# Patient Record
Sex: Male | Born: 1935
Health system: Southern US, Academic
[De-identification: ages and names within clinical notes are randomized; demographics above are authoritative.]

## PROBLEM LIST (undated history)

## (undated) ENCOUNTER — Encounter: Attending: Nurse Practitioner | Primary: Nurse Practitioner

## (undated) ENCOUNTER — Encounter: Attending: Vascular & Interventional Radiology | Primary: Vascular & Interventional Radiology

## (undated) ENCOUNTER — Telehealth: Attending: Nurse Practitioner | Primary: Nurse Practitioner

## (undated) ENCOUNTER — Encounter

## (undated) ENCOUNTER — Telehealth: Attending: Children | Primary: Children

## (undated) ENCOUNTER — Telehealth

## (undated) ENCOUNTER — Encounter
Attending: Student in an Organized Health Care Education/Training Program | Primary: Student in an Organized Health Care Education/Training Program

## (undated) ENCOUNTER — Telehealth
Attending: Student in an Organized Health Care Education/Training Program | Primary: Student in an Organized Health Care Education/Training Program

## (undated) ENCOUNTER — Ambulatory Visit: Payer: MEDICARE | Attending: Pharmacist | Primary: Pharmacist

## (undated) ENCOUNTER — Ambulatory Visit

## (undated) ENCOUNTER — Ambulatory Visit: Payer: Medicare (Managed Care)

## (undated) ENCOUNTER — Ambulatory Visit: Payer: PRIVATE HEALTH INSURANCE

## (undated) ENCOUNTER — Ambulatory Visit: Payer: PRIVATE HEALTH INSURANCE | Attending: Internal Medicine | Primary: Internal Medicine

## (undated) ENCOUNTER — Ambulatory Visit: Payer: MEDICARE

## (undated) ENCOUNTER — Telehealth: Attending: Gastroenterology | Primary: Gastroenterology

## (undated) ENCOUNTER — Ambulatory Visit: Payer: PRIVATE HEALTH INSURANCE | Attending: Registered" | Primary: Registered"

## (undated) ENCOUNTER — Ambulatory Visit: Attending: Nephrology | Primary: Nephrology

## (undated) ENCOUNTER — Ambulatory Visit
Payer: Medicare (Managed Care) | Attending: Vascular & Interventional Radiology | Primary: Vascular & Interventional Radiology

## (undated) ENCOUNTER — Ambulatory Visit: Payer: PRIVATE HEALTH INSURANCE | Attending: Nurse Practitioner | Primary: Nurse Practitioner

## (undated) ENCOUNTER — Ambulatory Visit: Payer: PRIVATE HEALTH INSURANCE | Attending: Nephrology | Primary: Nephrology

## (undated) ENCOUNTER — Ambulatory Visit
Payer: PRIVATE HEALTH INSURANCE | Attending: Student in an Organized Health Care Education/Training Program | Primary: Student in an Organized Health Care Education/Training Program

## (undated) ENCOUNTER — Encounter
Attending: Pharmacist Clinician (PhC)/ Clinical Pharmacy Specialist | Primary: Pharmacist Clinician (PhC)/ Clinical Pharmacy Specialist

## (undated) ENCOUNTER — Encounter: Attending: Gastroenterology | Primary: Gastroenterology

## (undated) ENCOUNTER — Telehealth: Attending: Registered" | Primary: Registered"

## (undated) ENCOUNTER — Telehealth
Payer: MEDICARE | Attending: Student in an Organized Health Care Education/Training Program | Primary: Student in an Organized Health Care Education/Training Program

## (undated) ENCOUNTER — Other Ambulatory Visit

## (undated) ENCOUNTER — Ambulatory Visit
Attending: Pharmacist Clinician (PhC)/ Clinical Pharmacy Specialist | Primary: Pharmacist Clinician (PhC)/ Clinical Pharmacy Specialist

## (undated) DIAGNOSIS — C801 Malignant (primary) neoplasm, unspecified: Secondary | ICD-10-CM

## (undated) DIAGNOSIS — E7800 Pure hypercholesterolemia, unspecified: Secondary | ICD-10-CM

## (undated) DIAGNOSIS — F32A Depression, unspecified: Secondary | ICD-10-CM

## (undated) DIAGNOSIS — K219 Gastro-esophageal reflux disease without esophagitis: Secondary | ICD-10-CM

## (undated) DIAGNOSIS — E119 Type 2 diabetes mellitus without complications: Secondary | ICD-10-CM

## (undated) DIAGNOSIS — M1990 Unspecified osteoarthritis, unspecified site: Secondary | ICD-10-CM

## (undated) DIAGNOSIS — I10 Essential (primary) hypertension: Secondary | ICD-10-CM

## (undated) DIAGNOSIS — N189 Chronic kidney disease, unspecified: Secondary | ICD-10-CM

## (undated) DIAGNOSIS — F329 Major depressive disorder, single episode, unspecified: Secondary | ICD-10-CM

## (undated) HISTORY — PX: COLONOSCOPY: SHX174

## (undated) HISTORY — PX: PROSTATE SURGERY: SHX751

## (undated) MED ORDER — AMLODIPINE 10 MG-VALSARTAN 320 MG TABLET: Freq: Every day | ORAL | 0.00000 days

---
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
## 2013-04-14 ENCOUNTER — Emergency Department: Payer: Self-pay | Admitting: Emergency Medicine

## 2013-10-26 ENCOUNTER — Emergency Department: Payer: Self-pay | Admitting: Emergency Medicine

## 2013-10-26 LAB — CBC WITH DIFFERENTIAL/PLATELET
Eosinophil #: 0.1 10*3/uL (ref 0.0–0.7)
Eosinophil %: 1.7 %
HCT: 39.7 % — ABNORMAL LOW (ref 40.0–52.0)
Lymphocyte #: 1.2 10*3/uL (ref 1.0–3.6)
Lymphocyte %: 18.3 %
MCH: 30.1 pg (ref 26.0–34.0)
Monocyte #: 0.4 x10 3/mm (ref 0.2–1.0)
Monocyte %: 6.9 %
Neutrophil %: 72 %
Platelet: 188 10*3/uL (ref 150–440)
RBC: 4.42 10*6/uL (ref 4.40–5.90)
RDW: 13.4 % (ref 11.5–14.5)

## 2013-10-26 LAB — COMPREHENSIVE METABOLIC PANEL
Albumin: 3.9 g/dL (ref 3.4–5.0)
Alkaline Phosphatase: 83 U/L
Anion Gap: 4 — ABNORMAL LOW (ref 7–16)
BUN: 22 mg/dL — ABNORMAL HIGH (ref 7–18)
Calcium, Total: 9.7 mg/dL (ref 8.5–10.1)
Co2: 28 mmol/L (ref 21–32)
Creatinine: 1.74 mg/dL — ABNORMAL HIGH (ref 0.60–1.30)
EGFR (Non-African Amer.): 37 — ABNORMAL LOW
Glucose: 210 mg/dL — ABNORMAL HIGH (ref 65–99)

## 2013-10-26 LAB — URINALYSIS, COMPLETE
Bilirubin,UR: NEGATIVE
Ketone: NEGATIVE
Leukocyte Esterase: NEGATIVE
Nitrite: NEGATIVE
Ph: 6 (ref 4.5–8.0)
Specific Gravity: 1.019 (ref 1.003–1.030)
Squamous Epithelial: 1
WBC UR: 1 /HPF (ref 0–5)

## 2013-10-26 LAB — LIPASE, BLOOD: Lipase: 234 U/L (ref 73–393)

## 2013-10-26 LAB — TROPONIN I: Troponin-I: <0.02 ng/mL

## 2013-12-15 ENCOUNTER — Ambulatory Visit: Payer: Self-pay | Admitting: Gastroenterology

## 2014-07-01 IMAGING — CR PELVIS - 1-2 VIEW
1 series · 1 of 1 positions shown · non-contrast
Comparison: none

REASON FOR EXAM: pain following trauma
COMMENTS:

PROCEDURE:     DXR - DXR PELVIS AP ONLY  - April 14, 2013  [DATE]
RESULT:     There is no evidence of fracture, dislocation, or malalignment.

[t pelvis ap]
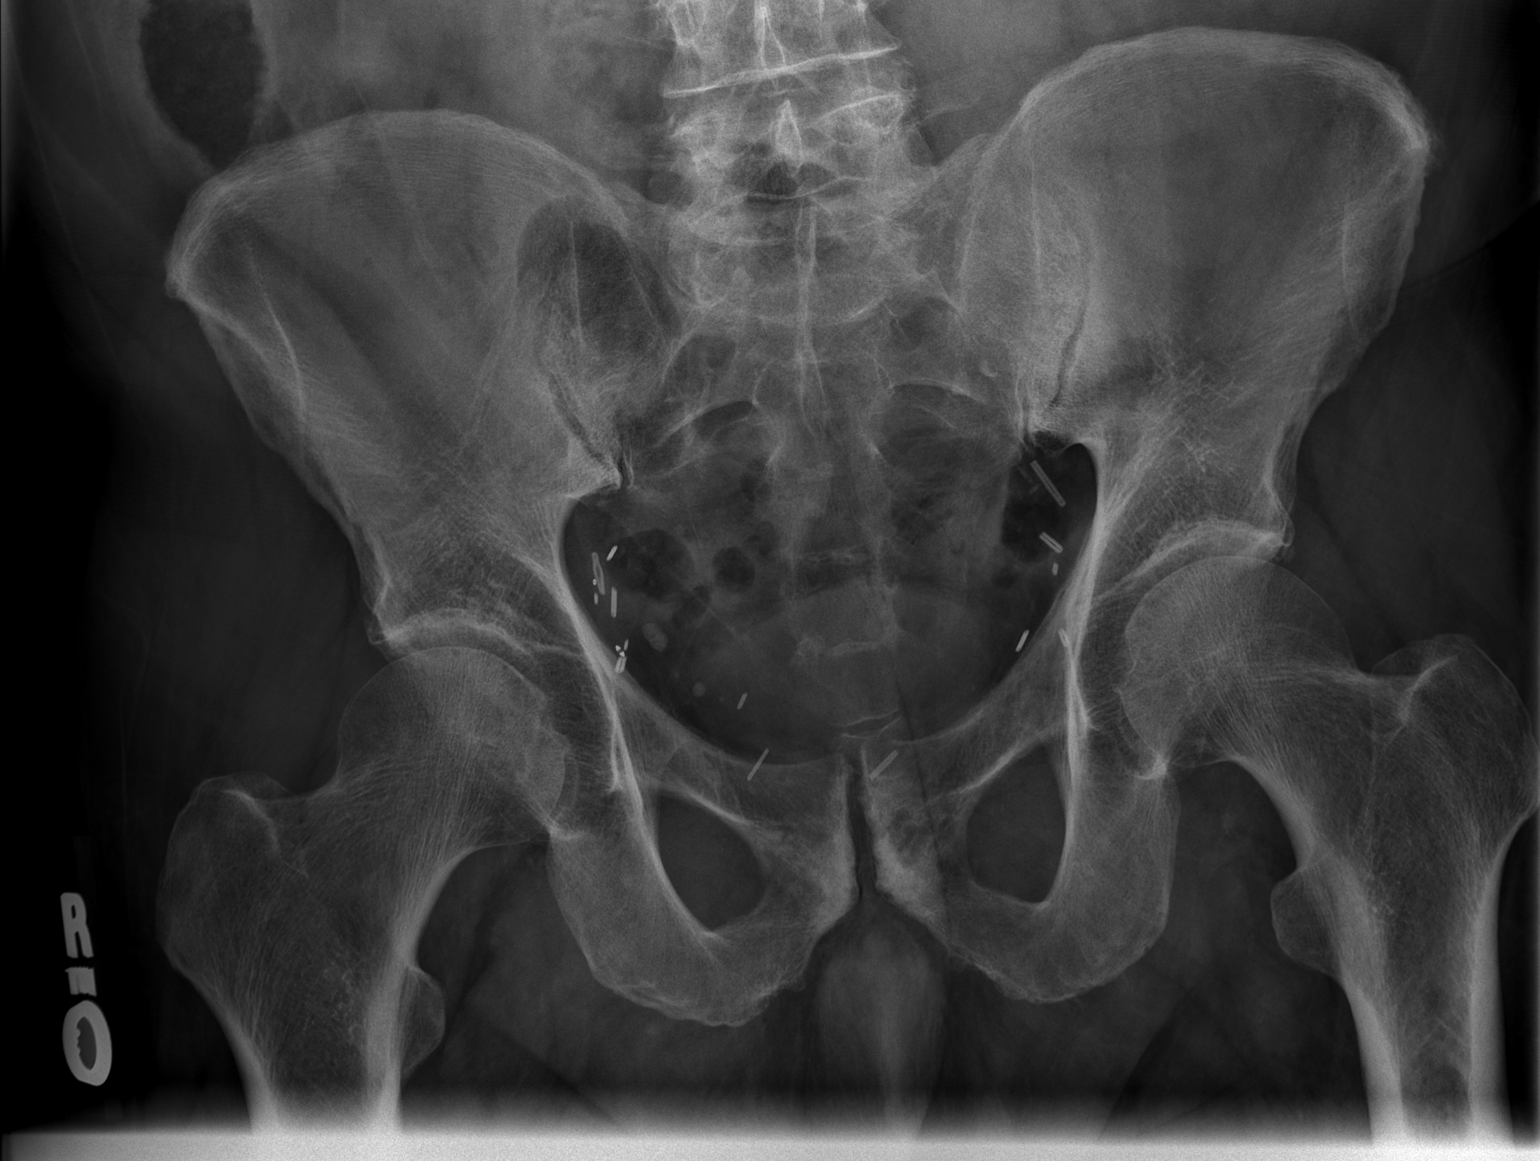

[1 of 1 positions shown; findings below may reference images not displayed]

IMPRESSION: 1. No evidence of acute abnormalities.
2. If there are persistent complaints of pain or persistent clinical
concern, a repeat evaluation in 7-10 days is recommended if clinically
warranted.

## 2014-07-10 ENCOUNTER — Inpatient Hospital Stay: Payer: Self-pay | Admitting: Internal Medicine

## 2014-07-10 LAB — BASIC METABOLIC PANEL
ANION GAP: 10 (ref 7–16)
BUN: 62 mg/dL — ABNORMAL HIGH (ref 7–18)
CALCIUM: 8.8 mg/dL (ref 8.5–10.1)
Chloride: 103 mmol/L (ref 98–107)
Co2: 24 mmol/L (ref 21–32)
Creatinine: 3.17 mg/dL — ABNORMAL HIGH (ref 0.60–1.30)
EGFR (African American): 21 — ABNORMAL LOW
EGFR (Non-African Amer.): 18 — ABNORMAL LOW
Glucose: 157 mg/dL — ABNORMAL HIGH (ref 65–99)
OSMOLALITY: 295 (ref 275–301)
POTASSIUM: 4.1 mmol/L (ref 3.5–5.1)
SODIUM: 137 mmol/L (ref 136–145)

## 2014-07-10 LAB — CBC
HCT: 31.6 % — ABNORMAL LOW (ref 40.0–52.0)
HGB: 10.1 g/dL — ABNORMAL LOW (ref 13.0–18.0)
MCH: 29.2 pg (ref 26.0–34.0)
MCHC: 31.9 g/dL — AB (ref 32.0–36.0)
MCV: 91 fL (ref 80–100)
Platelet: 255 10*3/uL (ref 150–440)
RBC: 3.46 10*6/uL — AB (ref 4.40–5.90)
RDW: 14.4 % (ref 11.5–14.5)
WBC: 14.3 10*3/uL — AB (ref 3.8–10.6)

## 2014-07-10 LAB — TROPONIN I

## 2014-07-11 LAB — CBC WITH DIFFERENTIAL/PLATELET
BASOS PCT: 0.1 %
Basophil #: 0 10*3/uL (ref 0.0–0.1)
EOS ABS: 0 10*3/uL (ref 0.0–0.7)
Eosinophil %: 0 %
HCT: 33.2 % — AB (ref 40.0–52.0)
HGB: 10.8 g/dL — AB (ref 13.0–18.0)
LYMPHS PCT: 4.9 %
Lymphocyte #: 0.5 10*3/uL — ABNORMAL LOW (ref 1.0–3.6)
MCH: 29.9 pg (ref 26.0–34.0)
MCHC: 32.5 g/dL (ref 32.0–36.0)
MCV: 92 fL (ref 80–100)
MONOS PCT: 2.6 %
Monocyte #: 0.3 x10 3/mm (ref 0.2–1.0)
NEUTROS PCT: 92.4 %
Neutrophil #: 10.1 10*3/uL — ABNORMAL HIGH (ref 1.4–6.5)
PLATELETS: 264 10*3/uL (ref 150–440)
RBC: 3.6 10*6/uL — ABNORMAL LOW (ref 4.40–5.90)
RDW: 14.5 % (ref 11.5–14.5)
WBC: 11 10*3/uL — AB (ref 3.8–10.6)

## 2014-07-11 LAB — BASIC METABOLIC PANEL
ANION GAP: 9 (ref 7–16)
BUN: 57 mg/dL — ABNORMAL HIGH (ref 7–18)
Calcium, Total: 8.5 mg/dL (ref 8.5–10.1)
Chloride: 104 mmol/L (ref 98–107)
Co2: 22 mmol/L (ref 21–32)
Creatinine: 2.56 mg/dL — ABNORMAL HIGH (ref 0.60–1.30)
EGFR (Non-African Amer.): 23 — ABNORMAL LOW
GFR CALC AF AMER: 27 — AB
GLUCOSE: 173 mg/dL — AB (ref 65–99)
Osmolality: 290 (ref 275–301)
Potassium: 4.1 mmol/L (ref 3.5–5.1)
SODIUM: 135 mmol/L — AB (ref 136–145)

## 2014-07-12 LAB — BASIC METABOLIC PANEL
Anion Gap: 7 (ref 7–16)
BUN: 54 mg/dL — AB (ref 7–18)
CALCIUM: 8.4 mg/dL — AB (ref 8.5–10.1)
CHLORIDE: 106 mmol/L (ref 98–107)
Co2: 24 mmol/L (ref 21–32)
Creatinine: 2.3 mg/dL — ABNORMAL HIGH (ref 0.60–1.30)
EGFR (Non-African Amer.): 27 — ABNORMAL LOW
GFR CALC AF AMER: 31 — AB
Glucose: 174 mg/dL — ABNORMAL HIGH (ref 65–99)
Osmolality: 293 (ref 275–301)
Potassium: 4.3 mmol/L (ref 3.5–5.1)
Sodium: 137 mmol/L (ref 136–145)

## 2014-07-12 LAB — CLOSTRIDIUM DIFFICILE(ARMC)

## 2014-07-13 LAB — BASIC METABOLIC PANEL
ANION GAP: 7 (ref 7–16)
BUN: 54 mg/dL — ABNORMAL HIGH (ref 7–18)
CALCIUM: 8.9 mg/dL (ref 8.5–10.1)
CHLORIDE: 109 mmol/L — AB (ref 98–107)
CREATININE: 2.02 mg/dL — AB (ref 0.60–1.30)
Co2: 26 mmol/L (ref 21–32)
EGFR (African American): 36 — ABNORMAL LOW
EGFR (Non-African Amer.): 31 — ABNORMAL LOW
GLUCOSE: 59 mg/dL — AB (ref 65–99)
OSMOLALITY: 296 (ref 275–301)
POTASSIUM: 3.6 mmol/L (ref 3.5–5.1)
SODIUM: 142 mmol/L (ref 136–145)

## 2014-07-14 LAB — EXPECTORATED SPUTUM ASSESSMENT W GRAM STAIN, RFLX TO RESP C

## 2014-07-15 LAB — CULTURE, BLOOD (SINGLE)

## 2014-07-16 LAB — STOOL CULTURE

## 2015-03-03 NOTE — Consult Note (Signed)
Brief Consult Note: Diagnosis: Possible  R renal mass. Right renal atrophy. Renal insufficiency.   Patient was seen by consultant.   Consult note dictated.   Recommend further assessment or treatment.   Discussed with Attending MD.   Comments: Patient would like to follow-up with a [facility] urologist, because that is where he receives his medical care. He will need a contrast CT to clearly evaluate the right kidney, however, renal function is not adequate now to allow this.  Electronic Signatures: Royston Cowper (MD)  (Signed 02-Sep-15 19:17)  Authored: Brief Consult Note   Last Updated: 02-Sep-15 19:17 by Royston Cowper (MD)

## 2015-03-03 NOTE — Discharge Summary (Signed)
PATIENT NAME:  [name] [name], [name] [name] MR#:  [id number] DATE OF BIRTH:  Dec 20, 1934  DATE OF ADMISSION:  07/10/2014 DATE OF DISCHARGE:  07/13/2014  PRIMARY CARE PHYSICIAN:  Nonlocal.  CONSULTATIONS: Urology, Otelia Limes. Yves Dill, MD.  DISCHARGE DIAGNOSES:  1.  Acute on chronic renal failure, likely due to dehydration and hypotension.  2.  Acute bronchitis.  3.  Right side kidney mass.  4.  Hypertension.  5.  Diabetes.  6.  Diabetic neuropathy. 7.  Hyperlipidemia.   CONDITION: Stable.   CODE STATUS: Full code.   HOME MEDICATIONS: Please refer to the medication reconciliation list. The patient's chlorthalidone and lisinopril were discontinued due to renal failure and hypotension. Imdur 30 mg p.o. daily was added.   DIET: Low-sodium, low-fat, low-cholesterol, ADA diet.   ACTIVITY: As tolerated.   FOLLOWUP CARE: Follow up with PCP within 1-2 weeks.  Follow up at [facility] for left renal mass. The patient may need a followup BMP with PCP.   REASON FOR ADMISSION: Cough with sputum production and hypotension.   HOSPITAL COURSE: The patient is a 80 year old Caucasian male with a history of hypertension, diabetes, presented to the ED with cough with sputum productive, hypotension.  The patient's chest x-ray suggested acute bronchitis. For detailed history and physical examination, please refer to the admission note dictated by Dr. Verdell Carmine.   LABORATORY DATA ON ADMISSION: BUN 62, creatinine 3.17, sodium 137, potassium 4.1, chloride 103, troponin was less than 0.02, WBC 14.3.   PROBLEM LIST:  1.  Acute on chronic renal failure, possibly due to dehydration and hypotension. The patient's lisinopril and chlorthalidone were discontinued due to renal failure. The patient has been treated with IV fluid support. Renal function is improving. The patient's BUN decreased to 54, creatinine decreased to 2.02 and since the patient has acute renal failure on chronic kidney disease, the patient got an ultrasound of  kidneys which showed right side renal mass. Dr. Yves Dill, urology, suggested the patient may need a CAT scan with contrast; however, as this patient has acute renal failure on chronic kidney disease, the patient cannot get a CAT scan of the abdomen. Dr. Yves Dill suggested followup as an outpatient. The patient wants to follow up with [facility] as an outpatient.  2.  Acute bronchitis. The patient has been treated with Levaquin. Symptoms have much improved, only has a mild cough.  3.  Diabetes. Has been controlled with sliding scale. The patient had some diarrhea for 3 days, however, stool for Clostridium difficile was negative. The patient's diarrhea resolved.   The patient has no complaints. Vital signs are stable. He is clinically stable and he will be discharged to home today. I discussed the patient's discharge plan with the patient, nurse, case manager.   TIME SPENT: About 36 minutes.    ____________________________ Demetrios Loll, MD qc:lt D: 07/13/2014 12:32:27 ET T: 07/13/2014 13:07:54 ET JOB#: [id number]  cc: Demetrios Loll, MD, <Dictator> Demetrios Loll MD ELECTRONICALLY SIGNED 07/13/2014 14:17

## 2015-03-03 NOTE — H&P (Signed)
PATIENT NAME:  [name], [name] MR#:  [id number] DATE OF BIRTH:  May 01, 1935  DATE OF ADMISSION:  07/10/2014   PRIMARY CARE PHYSICIAN: Located at [facility].   CHIEF COMPLAINT: Cough with productive sputum and hypotension.   HISTORY OF PRESENT ILLNESS: This 80 year old male who presents to the hospital with generalized weakness and having a cough now for about a week to 2 weeks.  The patient says this cough has been productive.  Initially, it was green in color, now it is yellow and mucusy in color.  He went to urgent care.  Over there, he was noted to be hypotensive with blood pressures in the 80s.  He was sent over to the ER for further evaluation.  In the Emergency Room, the patient was not noted to be febrile.  He was not hypotensive when he arrived here.  Although on blood work, he was noted to be in acute on chronic renal failure, also noted to have leukocytosis.  The patient's chest x-ray finding were also suggestive of acute bronchitis.  Hospitalist services were contacted for further treatment and evaluation.  The patient denies any recent sick contacts.  He does admit to a cough that keeps him up at night with productive sputum as mentioned, no shortness of breath. No nausea, no vomiting. No abdominal pain. No diarrhea. No other associated symptoms presently.   REVIEW OF SYSTEMS:  CONSTITUTIONAL: No documented fever. Positive fatigue. No weight gain, no weight loss.  EYES: No blurry or double vision.  ENT: No tinnitus. No postnasal drip. No redness of the oropharynx.  RESPIRATORY: Positive cough productive, no dyspnea, no hemoptysis, no COPD.  CARDIOVASCULAR: No chest pain, no orthopnea or palpitation no syncope.  GASTROINTESTINAL: No nausea, no vomiting, diarrhea. No abdominal pain. No melena or hematochezia.  GENITOURINARY: No dysuria and hematuria.  ENDOCRINE: No polyuria or nocturia. No heat or cold intolerance.  HEMATOLOGIC: No anemia, no bruising, no bleeding.  INTEGUMENTARY: No rashes. No  lesions.  MUSCULOSKELETAL: No arthritis, no swelling. No gout.  NEUROLOGIC: No numbness or tingling. No ataxia. No seizure-type activity.  PSYCHIATRIC: No anxiety no insomnia. No ADD disorder.   PAST MEDICAL HISTORY: Consistent with diabetes, hypertension, hyperlipidemia, depression, diabetic neuropathy.   ALLERGIES: NORVASC.   SOCIAL HISTORY:  Used to smoke, quit years ago.  No alcohol abuse. No illicit drug abuse. The patient does have a 20 pack-year tobacco abuse history.   FAMILY HISTORY: Mother and father are both deceased. Mother died from [place].  Father died from colon cancer.   CURRENT MEDICATIONS: Tylenol with hydrocodone 10/325 1 tablet every 6 hours as needed,  Coreg 12.5 mg p.o. daily, chlorthalidone 50 mg daily, Crestor 10 mg daily, gabapentin 800 mg 1/2 tab b.i.d., Lantus 30 units at bedtime, lisinopril 10 mg 1/2 tab daily, PreserVision 2 tabs daily, Effexor 75 mg daily.   PHYSICAL EXAMINATION: Presently is as follows:  VITAL SIGNS:  Temperature is 98.3, pulse 69, respirations 18, blood pressure 119/63, sats 97% on 2 liters nasal cannula   GENERAL: He is a pleasant-appearing male in no apparent distress.  HEAD, EYES, EARS, NOSE AND THROAT: Atraumatic, normocephalic. Extraocular muscles are intact. Pupils equal and reactive to light.  Sclerae anicteric. No conjunctival injection. No oropharyngeal erythema. Positive dry oral mucosa.  NECK: Supple. There is no jugular venous distention, no bruits, no lymphadenopathy, no thyromegaly.  HEART: Regular rate and rhythm. No murmurs, no rubs, no clicks.  LUNGS: Clear to auscultation bilaterally. No rales, rhonchi, no wheezes.  ABDOMEN: Soft, flat,  nontender, nondistended. Has good bowel sounds. No hepatosplenomegaly appreciated.  EXTREMITIES: No evidence of any cyanosis, clubbing, or peripheral edema.  Has +2 pedal and radial pulses bilaterally.  NEUROLOGICAL: The patient is alert and oriented x 3 with no focal motor or sensory  deficits appreciated bilaterally.  SKIN: Moist and warm with no rashes appreciated.  LYMPHATIC: There is no cervical lymphadenopathy.   LABORATORY DATA: Serum glucose of 157, BUN 62, creatinine 3.17, sodium 137, potassium 4.1, chloride 103, bicarbonate 24. The patient's troponin less than 0.02.  White cell count 14.3, hemoglobin 10.1, hematocrit 31.6, platelet count 255.   The patient did have a chest x-ray done which showed airway thickening suggesting bronchitis or reactive airway disease.   ASSESSMENT AND PLAN: This is a 80 year old male with a history of diabetes, hypertension, hyperlipidemia, depression, diabetic neuropathy, who presents to the hospital due to worsening cough, which is productive and noted to be hypotensive. 1.  Acute on chronic renal failure.  This is likely secondary to dehydration and hypotension.  The patient's baseline creatinine in December of this past year was 1.7.  I will hydrate the patient with IV fluids, follow BUN and creatinine and urine output, renal dose medications. Avoid nephrotoxins. I will hold his lisinopril and his chlorthalidone for now.  2.  Acute bronchitis likely cause of patient's cough, weakness and productive sputum.  The patient's chest x-ray findings are suggestive of this with no superimposed pneumonia presently. I will treat the patient with IV Levaquin. Follow sputum cultures.   3.  Diabetes. Continue Lantus and sliding scale insulin.  Follow blood sugars.  Continue carb-controlled diet.  4.  Hyperlipidemia. Continue Crestor.  5.  Depression. Continue Effexor.  6.  Diabetic neuropathy. Continue Neurontin.   CODE STATUS: The patient is a FULL CODE.      ____________________________ Belia Heman. Verdell Carmine, MD vjs:DT D: 07/10/2014 16:00:04 ET T: 07/10/2014 17:05:00 ET JOB#: [id number]  cc: Belia Heman. Verdell Carmine, MD, <Dictator> Henreitta Leber MD ELECTRONICALLY SIGNED 07/19/2014 15:53

## 2015-03-03 NOTE — Consult Note (Signed)
PATIENT NAME:  [name] [name], [name] [name] MR#:  [id number] DATE OF BIRTH:  11/11/1934  DATE OF CONSULTATION:  07/12/2014  REQUESTING PHYSICIAN:  Bridgett Larsson CONSULTING PHYSICIAN:  Otelia Limes. Yves Dill, MD  REASON FOR CONSULTATION: Possible right renal mass.   HISTORY OF PRESENT ILLNESS: [name] [name] is a 80 year old Caucasian male admitted to the hospital with hypotension and productive cough. He was also noted to have an elevated creatinine of 2.30 mg/dL. This prompted a renal ultrasound which indicated that he had atrophy of the right kidney with a questionable midpole cortical mass. Prior ultrasound 10/2013 did not reveal a mass.The patient denied hematuria or flank pain. He does have a past history of renal insufficiency and has seen several nephrologists, at [facility] and locally.   PAST MEDICAL HISTORY:    ALLERGIES: NORVASC.   The patient denied prior surgical procedures.   CHRONIC MEDICATIONS: Include Tylenol with Codeine, Coreg, chlorthalidone, Crestor, gabapentin, Lantus insulin, lisinopril, PreserVision, and Effexor.   SOCIAL HISTORY:  The patient quit smoking 10 years ago with a 20 pack-year history. He denied alcohol use.   FAMILY HISTORY: Negative for urologic disease.   PAST AND CURRENT MEDICAL CONDITIONS:  1.  Diabetes.  2.  Hypertension.  3.  Hyperlipidemia.  4.  Depression.  5.  Diabetic neuropathy.  6.  Renal insufficiency.   REVIEW OF SYSTEMS: The patient denied hematuria, flank pain, dysuria, or difficulty voiding.    PHYSICAL EXAMINATION: Exam was deferred.   Renal ultrasound report dated September 2nd was reviewed.   PERTINENT LABORATORY STUDIES: Include BUN of 54 mg per deciliter and creatinine of 2.30 mg per deciliter on September 2nd.  IMPRESSION: 1.  Right renal atrophy.  2.  Renal insufficiency.  3.  Possible right mid pole renal mass.   SUGGESTIONS:   1.  Renal ultrasound dated October 26, 2013 did not indicate that there was a presence of a mass, but the quality of the  study was poor.  2.  The patient will ultimately need a CT scan with IV contrast to fully delineate the right kidney. This is not possible at this point because of the elevated creatinine. The patient would prefer further urological evaluation at [facility] because that is where he receives his medical care.    ____________________________ Otelia Limes. Yves Dill, MD mrw:LT D: 07/12/2014 19:23:00 ET T: 07/12/2014 20:54:05 ET JOB#: [id number]  cc: Otelia Limes. Yves Dill, MD, <Dictator> Royston Cowper MD ELECTRONICALLY SIGNED 07/12/2014 21:28

## 2015-10-18 ENCOUNTER — Encounter: Payer: Self-pay | Admitting: *Deleted

## 2015-10-23 ENCOUNTER — Encounter: Payer: Self-pay | Admitting: *Deleted

## 2015-10-23 ENCOUNTER — Encounter
Admission: RE | Disposition: A | Discharge status: Home / Self Care | Payer: Self-pay | Source: Ambulatory Visit | Attending: Ophthalmology

## 2015-10-23 ENCOUNTER — Ambulatory Visit: Payer: PPO | Admitting: Anesthesiology

## 2015-10-23 ENCOUNTER — Ambulatory Visit
Admission: RE | Admit: 2015-10-23 | Discharge: 2015-10-23 | Disposition: A | Discharge status: Home / Self Care | Payer: PPO | Source: Ambulatory Visit | Attending: Ophthalmology | Admitting: Ophthalmology

## 2015-10-23 DIAGNOSIS — Z8546 Personal history of malignant neoplasm of prostate: Secondary | ICD-10-CM | POA: Insufficient documentation

## 2015-10-23 DIAGNOSIS — M1990 Unspecified osteoarthritis, unspecified site: Secondary | ICD-10-CM | POA: Insufficient documentation

## 2015-10-23 DIAGNOSIS — I10 Essential (primary) hypertension: Secondary | ICD-10-CM | POA: Diagnosis not present

## 2015-10-23 DIAGNOSIS — H2512 Age-related nuclear cataract, left eye: Secondary | ICD-10-CM | POA: Diagnosis present

## 2015-10-23 DIAGNOSIS — Z79899 Other long term (current) drug therapy: Secondary | ICD-10-CM | POA: Insufficient documentation

## 2015-10-23 DIAGNOSIS — Z794 Long term (current) use of insulin: Secondary | ICD-10-CM | POA: Insufficient documentation

## 2015-10-23 DIAGNOSIS — Z888 Allergy status to other drugs, medicaments and biological substances status: Secondary | ICD-10-CM | POA: Diagnosis not present

## 2015-10-23 DIAGNOSIS — E119 Type 2 diabetes mellitus without complications: Secondary | ICD-10-CM | POA: Diagnosis not present

## 2015-10-23 DIAGNOSIS — N289 Disorder of kidney and ureter, unspecified: Secondary | ICD-10-CM | POA: Insufficient documentation

## 2015-10-23 DIAGNOSIS — F329 Major depressive disorder, single episode, unspecified: Secondary | ICD-10-CM | POA: Insufficient documentation

## 2015-10-23 DIAGNOSIS — Z7982 Long term (current) use of aspirin: Secondary | ICD-10-CM | POA: Insufficient documentation

## 2015-10-23 DIAGNOSIS — E7800 Pure hypercholesterolemia, unspecified: Secondary | ICD-10-CM | POA: Insufficient documentation

## 2015-10-23 HISTORY — PX: CATARACT EXTRACTION W/PHACO: SHX586

## 2015-10-23 HISTORY — DX: Unspecified osteoarthritis, unspecified site: M19.90

## 2015-10-23 HISTORY — DX: Gastro-esophageal reflux disease without esophagitis: K21.9

## 2015-10-23 HISTORY — DX: Malignant (primary) neoplasm, unspecified: C80.1

## 2015-10-23 HISTORY — DX: Essential (primary) hypertension: I10

## 2015-10-23 HISTORY — DX: Depression, unspecified: F32.A

## 2015-10-23 HISTORY — DX: Type 2 diabetes mellitus without complications: E11.9

## 2015-10-23 HISTORY — DX: Major depressive disorder, single episode, unspecified: F32.9

## 2015-10-23 HISTORY — DX: Chronic kidney disease, unspecified: N18.9

## 2015-10-23 LAB — GLUCOSE, CAPILLARY: GLUCOSE-CAPILLARY: 107 mg/dL — AB (ref 65–99)

## 2015-10-23 SURGERY — PHACOEMULSIFICATION, CATARACT, WITH IOL INSERTION
Anesthesia: Monitor Anesthesia Care | Site: Eye | Laterality: Left | Wound class: Clean

## 2015-10-23 MED ORDER — LIDOCAINE HCL (PF) 1 % IJ SOLN
INTRAMUSCULAR | Status: AC
  Filled 2015-10-23: qty 2

## 2015-10-23 MED ORDER — MIDAZOLAM HCL 2 MG/2ML IJ SOLN
INTRAMUSCULAR | Status: DC | PRN
  Administered 2015-10-23: 1 mg via INTRAVENOUS

## 2015-10-23 MED ORDER — FENTANYL CITRATE (PF) 100 MCG/2ML IJ SOLN
INTRAMUSCULAR | Status: DC | PRN
  Administered 2015-10-23: 50 ug via INTRAVENOUS

## 2015-10-23 MED ORDER — ONDANSETRON HCL 4 MG/2ML IJ SOLN: 4.0000 mg | Freq: Once | INTRAMUSCULAR | Status: DC | PRN

## 2015-10-23 MED ORDER — CARVEDILOL 25 MG PO TABS
ORAL_TABLET | ORAL | Status: AC
  Filled 2015-10-23: qty 1

## 2015-10-23 MED ORDER — POVIDONE-IODINE 5 % OP SOLN
OPHTHALMIC | Status: AC
  Administered 2015-10-23: 1 via OPHTHALMIC
  Filled 2015-10-23: qty 30

## 2015-10-23 MED ORDER — MOXIFLOXACIN HCL 0.5 % OP SOLN
OPHTHALMIC | Status: AC
  Filled 2015-10-23: qty 3

## 2015-10-23 MED ORDER — CARVEDILOL 25 MG PO TABS
25.0000 mg | ORAL_TABLET | Freq: Once | ORAL | Status: AC
  Administered 2015-10-23: 25 mg via ORAL

## 2015-10-23 MED ORDER — POVIDONE-IODINE 5 % OP SOLN
1.0000 "application " | OPHTHALMIC | Status: AC | PRN
  Administered 2015-10-23: 1 via OPHTHALMIC

## 2015-10-23 MED ORDER — TETRACAINE HCL 0.5 % OP SOLN
1.0000 [drp] | OPHTHALMIC | Status: AC | PRN
  Administered 2015-10-23: 1 [drp] via OPHTHALMIC

## 2015-10-23 MED ORDER — SODIUM CHLORIDE 0.9 % IV SOLN
INTRAVENOUS | Status: DC
  Administered 2015-10-23 (×2): via INTRAVENOUS

## 2015-10-23 MED ORDER — CARBACHOL 0.01 % IO SOLN
INTRAOCULAR | Status: DC | PRN
  Administered 2015-10-23: .5 mL via INTRAOCULAR

## 2015-10-23 MED ORDER — NA CHONDROIT SULF-NA HYALURON 40-17 MG/ML IO SOLN
INTRAOCULAR | Status: AC
  Filled 2015-10-23: qty 1

## 2015-10-23 MED ORDER — ARMC OPHTHALMIC DILATING GEL
OPHTHALMIC | Status: AC
  Administered 2015-10-23: 1 via OPHTHALMIC
  Filled 2015-10-23: qty 0.25

## 2015-10-23 MED ORDER — MOXIFLOXACIN HCL 0.5 % OP SOLN: 1.0000 [drp] | OPHTHALMIC | Status: DC | PRN

## 2015-10-23 MED ORDER — CEFUROXIME OPHTHALMIC INJECTION 1 MG/0.1 ML
INJECTION | OPHTHALMIC | Status: AC
  Filled 2015-10-23: qty 0.1

## 2015-10-23 MED ORDER — EPINEPHRINE HCL 1 MG/ML IJ SOLN
INTRAOCULAR | Status: DC | PRN
  Administered 2015-10-23: 1 mL via OPHTHALMIC

## 2015-10-23 MED ORDER — MOXIFLOXACIN HCL 0.5 % OP SOLN
OPHTHALMIC | Status: DC | PRN
  Administered 2015-10-23: 1 [drp] via OPHTHALMIC

## 2015-10-23 MED ORDER — NA CHONDROIT SULF-NA HYALURON 40-17 MG/ML IO SOLN
INTRAOCULAR | Status: DC | PRN
  Administered 2015-10-23: 1 mL via INTRAOCULAR

## 2015-10-23 MED ORDER — EPINEPHRINE HCL 1 MG/ML IJ SOLN
INTRAMUSCULAR | Status: AC
  Filled 2015-10-23: qty 1

## 2015-10-23 MED ORDER — TETRACAINE HCL 0.5 % OP SOLN
OPHTHALMIC | Status: AC
  Administered 2015-10-23: 1 [drp] via OPHTHALMIC
  Filled 2015-10-23: qty 2

## 2015-10-23 MED ORDER — ARMC OPHTHALMIC DILATING GEL
1.0000 "application " | OPHTHALMIC | Status: AC | PRN
  Administered 2015-10-23 (×2): 1 via OPHTHALMIC

## 2015-10-23 MED ORDER — CEFUROXIME OPHTHALMIC INJECTION 1 MG/0.1 ML
INJECTION | OPHTHALMIC | Status: DC | PRN
  Administered 2015-10-23: .1 mL via INTRACAMERAL

## 2015-10-23 MED ORDER — FENTANYL CITRATE (PF) 100 MCG/2ML IJ SOLN: 25.0000 ug | INTRAMUSCULAR | Status: DC | PRN

## 2015-10-23 SURGICAL SUPPLY — 22 items
CANNULA ANT/CHMB 27GA (MISCELLANEOUS) ×3 IMPLANT
CUP MEDICINE 2OZ PLAST GRAD ST (MISCELLANEOUS) ×3 IMPLANT
GLOVE BIO SURGEON STRL SZ8 (GLOVE) ×3 IMPLANT
GLOVE BIOGEL M 6.5 STRL (GLOVE) ×3 IMPLANT
GLOVE SURG LX 8.0 MICRO (GLOVE) ×2
GLOVE SURG LX STRL 8.0 MICRO (GLOVE) ×1 IMPLANT
GOWN STRL REUS W/ TWL LRG LVL3 (GOWN DISPOSABLE) ×2 IMPLANT
GOWN STRL REUS W/TWL LRG LVL3 (GOWN DISPOSABLE) ×4
LENS IOL TECNIS 23.5 (Intraocular Lens) ×3 IMPLANT
LENS IOL TECNIS MONO 1P 23.5 (Intraocular Lens) ×1 IMPLANT
PACK CATARACT (MISCELLANEOUS) ×3 IMPLANT
PACK CATARACT BRASINGTON LX (MISCELLANEOUS) ×3 IMPLANT
PACK EYE AFTER SURG (MISCELLANEOUS) ×3 IMPLANT
SOL BSS BAG (MISCELLANEOUS) ×3
SOL PREP PVP 2OZ (MISCELLANEOUS) ×3
SOLUTION BSS BAG (MISCELLANEOUS) ×1 IMPLANT
SOLUTION PREP PVP 2OZ (MISCELLANEOUS) ×1 IMPLANT
SYR 3ML LL SCALE MARK (SYRINGE) ×3 IMPLANT
SYR 5ML LL (SYRINGE) ×3 IMPLANT
SYR TB 1ML 27GX1/2 LL (SYRINGE) ×3 IMPLANT
WATER STERILE IRR 1000ML POUR (IV SOLUTION) ×3 IMPLANT
WIPE NON LINTING 3.25X3.25 (MISCELLANEOUS) ×3 IMPLANT

## 2015-10-23 NOTE — Anesthesia Postprocedure Evaluation (Signed)
Anesthesia Post Note  Patient: [name] [name]  Procedure(s) Performed: Procedure(s) (LRB): CATARACT EXTRACTION PHACO AND INTRAOCULAR LENS PLACEMENT (IOC) (Left)  Patient location during evaluation: PACU Anesthesia Type: MAC Level of consciousness: awake Pain management: satisfactory to patient Vital Signs Assessment: post-procedure vital signs reviewed and stable Respiratory status: nonlabored ventilation Cardiovascular status: stable Anesthetic complications: no    Last Vitals:  Filed Vitals:   10/23/15 0641 10/23/15 0826  BP: 184/89 154/75  Pulse: 85 74  Temp: 36.6 C 36.1 C  Resp: 20 16    Last Pain: There were no vitals filed for this visit.               VAN STAVEREN,Julieanna Geraci

## 2015-10-23 NOTE — Transfer of Care (Signed)
Immediate Anesthesia Transfer of Care Note  Patient: Daisey Must Matusik  Procedure(s) Performed: Procedure(s) with comments: CATARACT EXTRACTION PHACO AND INTRAOCULAR LENS PLACEMENT (IOC) (Left) - [place] 00:51 AP% 21.7 CDE 11.20 fluid pack lot # [id number]  Patient Location: PACU  Anesthesia Type:MAC  Level of Consciousness: awake, alert  and oriented  Airway & Oxygen Therapy: Patient Spontanous Breathing  Post-op Assessment: Report given to RN and Post -op Vital signs reviewed and stable  Post vital signs: Reviewed and stable  Last Vitals:  Filed Vitals:   10/23/15 0641  BP: 184/89  Pulse: 85  Temp: 36.6 C  Resp: 20    Complications: No apparent anesthesia complications

## 2015-10-23 NOTE — Op Note (Signed)
PREOPERATIVE DIAGNOSIS:  Nuclear sclerotic cataract of the left eye.   POSTOPERATIVE DIAGNOSIS:  nuclear sclerotic cataract left eye   OPERATIVE PROCEDURE:  Procedure(s): CATARACT EXTRACTION PHACO AND INTRAOCULAR LENS PLACEMENT (IOC)   SURGEON:  Birder Robson, MD.   ANESTHESIA:   Anesthesiologist: Gijsbertus Lonia Mad, MD CRNA: Nelda Marseille, CRNA  1.      Managed anesthesia care. 2.      Topical tetracaine drops followed by 2% Xylocaine jelly applied in the preoperative holding area.   COMPLICATIONS:  None.   TECHNIQUE:   Stop and chop   DESCRIPTION OF PROCEDURE:  The patient was examined and consented in the preoperative holding area where the aforementioned topical anesthesia was applied to the left eye and then brought back to the Operating Room where the left eye was prepped and draped in the usual sterile ophthalmic fashion and a lid speculum was placed. A paracentesis was created with the side port blade and the anterior chamber was filled with viscoelastic. A near clear corneal incision was performed with the steel keratome. A continuous curvilinear capsulorrhexis was performed with a cystotome followed by the capsulorrhexis forceps. Hydrodissection and hydrodelineation were carried out with BSS on a blunt cannula. The lens was removed in a stop and chop  technique and the remaining cortical material was removed with the irrigation-aspiration handpiece. The capsular bag was inflated with viscoelastic and the Technis ZCB00 lens was placed in the capsular bag without complication. The remaining viscoelastic was removed from the eye with the irrigation-aspiration handpiece. The wounds were hydrated. The anterior chamber was flushed with Miostat and the eye was inflated to physiologic pressure. 0.1 mL of cefuroxime concentration 10 mg/mL was placed in the anterior chamber. The wounds were found to be water tight. The eye was dressed with Vigamox. The patient was given protective glasses  to wear throughout the day and a shield with which to sleep tonight. The patient was also given drops with which to begin a drop regimen today and will follow-up with me in one day.  Implant Name Type Inv. Item Serial No. Manufacturer Lot No. LRB No. Used  LENS IMPL INTRAOC ZCB00 23.5 - [id number] Intraocular Lens LENS IMPL INTRAOC ZCB00 23.5 [id number]   Left 1   Procedure(s) with comments: CATARACT EXTRACTION PHACO AND INTRAOCULAR LENS PLACEMENT (IOC) (Left) - [place] 00:51 AP% 21.7 CDE 11.20 fluid pack lot # [id number]  Electronically signed: [place] 10/23/2015 8:58 AM

## 2015-10-23 NOTE — Discharge Instructions (Signed)
AMBULATORY SURGERY  °DISCHARGE INSTRUCTIONS ° ° °1) The drugs that you were given will stay in your system until tomorrow so for the next 24 hours you should not: ° °A) Drive an automobile °B) Make any legal decisions °C) Drink any alcoholic beverage ° ° °2) You may resume regular meals tomorrow.  Today it is better to start with liquids and gradually work up to solid foods. ° °You may eat anything you prefer, but it is better to start with liquids, then soup and crackers, and gradually work up to solid foods. ° ° °3) Please notify your doctor immediately if you have any unusual bleeding, trouble breathing, redness and pain at the surgery site, drainage, fever, or pain not relieved by medication. ° ° ° °4) Additional Instructions: ° ° ° ° ° ° ° °Please contact your physician with any problems or Same Day Surgery at [phone], Monday through Friday 6 am to 4 pm, or [place] at [facility] Main number at [phone].AMBULATORY SURGERY  °DISCHARGE INSTRUCTIONS ° ° °5) The drugs that you were given will stay in your system until tomorrow so for the next 24 hours you should not: ° °D) Drive an automobile °E) Make any legal decisions °F) Drink any alcoholic beverage ° ° °6) You may resume regular meals tomorrow.  Today it is better to start with liquids and gradually work up to solid foods. ° °You may eat anything you prefer, but it is better to start with liquids, then soup and crackers, and gradually work up to solid foods. ° ° °7) Please notify your doctor immediately if you have any unusual bleeding, trouble breathing, redness and pain at the surgery site, drainage, fever, or pain not relieved by medication. ° ° ° °8) Additional Instructions: ° ° ° ° ° ° ° °Please contact your physician with any problems or Same Day Surgery at [phone], Monday through Friday 6 am to 4 pm, or [place] at [facility] Main number at [phone]. °

## 2015-10-23 NOTE — Anesthesia Procedure Notes (Signed)
Procedure Name: MAC Date/Time: 10/23/2015 8:15 AM Performed by: Nelda Marseille Pre-anesthesia Checklist: Patient identified, Emergency Drugs available, Suction available, Patient being monitored and Timeout performed Oxygen Delivery Method: Nasal cannula

## 2015-10-23 NOTE — H&P (Signed)
  All labs reviewed. Abnormal studies sent to patients PCP when indicated.  Previous H&P reviewed, patient examined, there are NO CHANGES.  Osie Amparo LOUIS12/13/20167:56 AM

## 2015-10-23 NOTE — Anesthesia Preprocedure Evaluation (Signed)
Anesthesia Evaluation  Patient identified by MRN, date of birth, ID band Patient awake    Reviewed: Allergy & Precautions, NPO status , Patient's Chart, lab work & pertinent test results  Airway Mallampati: II       Dental  (+) Teeth Intact   Pulmonary former smoker,    breath sounds clear to auscultation       Cardiovascular hypertension, Pt. on home beta blockers  Rhythm:Regular     Neuro/Psych Depression    GI/Hepatic Neg liver ROS, GERD  ,  Endo/Other  diabetes, Well Controlled, Type 2, Oral Hypoglycemic Agents  Renal/GU      Musculoskeletal   Abdominal (+) + obese,   Peds  Hematology   Anesthesia Other Findings   Reproductive/Obstetrics                             Anesthesia Physical Anesthesia Plan  ASA: III  Anesthesia Plan: MAC   Post-op Pain Management:    Induction: Intravenous  Airway Management Planned: Nasal Cannula  Additional Equipment:   Intra-op Plan:   Post-operative Plan:   Informed Consent: I have reviewed the patients History and Physical, chart, labs and discussed the procedure including the risks, benefits and alternatives for the proposed anesthesia with the patient or authorized representative who has indicated his/her understanding and acceptance.     Plan Discussed with: CRNA  Anesthesia Plan Comments:         Anesthesia Quick Evaluation

## 2015-11-13 ENCOUNTER — Encounter
Admission: RE | Disposition: A | Discharge status: Home / Self Care | Payer: Self-pay | Source: Ambulatory Visit | Attending: Ophthalmology

## 2015-11-13 ENCOUNTER — Ambulatory Visit: Payer: PPO | Admitting: Anesthesiology

## 2015-11-13 ENCOUNTER — Encounter: Payer: Self-pay | Admitting: *Deleted

## 2015-11-13 ENCOUNTER — Ambulatory Visit
Admission: RE | Admit: 2015-11-13 | Discharge: 2015-11-13 | Disposition: A | Discharge status: Home / Self Care | Payer: PPO | Source: Ambulatory Visit | Attending: Ophthalmology | Admitting: Ophthalmology

## 2015-11-13 DIAGNOSIS — N189 Chronic kidney disease, unspecified: Secondary | ICD-10-CM | POA: Diagnosis not present

## 2015-11-13 DIAGNOSIS — Z794 Long term (current) use of insulin: Secondary | ICD-10-CM | POA: Insufficient documentation

## 2015-11-13 DIAGNOSIS — E7800 Pure hypercholesterolemia, unspecified: Secondary | ICD-10-CM | POA: Insufficient documentation

## 2015-11-13 DIAGNOSIS — E669 Obesity, unspecified: Secondary | ICD-10-CM | POA: Insufficient documentation

## 2015-11-13 DIAGNOSIS — I10 Essential (primary) hypertension: Secondary | ICD-10-CM | POA: Diagnosis not present

## 2015-11-13 DIAGNOSIS — K219 Gastro-esophageal reflux disease without esophagitis: Secondary | ICD-10-CM | POA: Diagnosis not present

## 2015-11-13 DIAGNOSIS — F329 Major depressive disorder, single episode, unspecified: Secondary | ICD-10-CM | POA: Insufficient documentation

## 2015-11-13 DIAGNOSIS — Z6830 Body mass index (BMI) 30.0-30.9, adult: Secondary | ICD-10-CM | POA: Insufficient documentation

## 2015-11-13 DIAGNOSIS — Z9842 Cataract extraction status, left eye: Secondary | ICD-10-CM | POA: Diagnosis not present

## 2015-11-13 DIAGNOSIS — Z79899 Other long term (current) drug therapy: Secondary | ICD-10-CM | POA: Diagnosis not present

## 2015-11-13 DIAGNOSIS — E119 Type 2 diabetes mellitus without complications: Secondary | ICD-10-CM | POA: Diagnosis not present

## 2015-11-13 DIAGNOSIS — Z8546 Personal history of malignant neoplasm of prostate: Secondary | ICD-10-CM | POA: Diagnosis not present

## 2015-11-13 DIAGNOSIS — Z7982 Long term (current) use of aspirin: Secondary | ICD-10-CM | POA: Insufficient documentation

## 2015-11-13 DIAGNOSIS — M1990 Unspecified osteoarthritis, unspecified site: Secondary | ICD-10-CM | POA: Insufficient documentation

## 2015-11-13 DIAGNOSIS — I129 Hypertensive chronic kidney disease with stage 1 through stage 4 chronic kidney disease, or unspecified chronic kidney disease: Secondary | ICD-10-CM | POA: Diagnosis not present

## 2015-11-13 DIAGNOSIS — H2511 Age-related nuclear cataract, right eye: Secondary | ICD-10-CM | POA: Insufficient documentation

## 2015-11-13 HISTORY — DX: Pure hypercholesterolemia, unspecified: E78.00

## 2015-11-13 HISTORY — PX: CATARACT EXTRACTION W/PHACO: SHX586

## 2015-11-13 LAB — GLUCOSE, CAPILLARY: Glucose-Capillary: 160 mg/dL — ABNORMAL HIGH (ref 65–99)

## 2015-11-13 SURGERY — PHACOEMULSIFICATION, CATARACT, WITH IOL INSERTION
Anesthesia: Monitor Anesthesia Care | Site: Eye | Laterality: Right | Wound class: Clean

## 2015-11-13 MED ORDER — TETRACAINE HCL 0.5 % OP SOLN
OPHTHALMIC | Status: AC
  Administered 2015-11-13: 1 [drp] via OPHTHALMIC
  Filled 2015-11-13: qty 2

## 2015-11-13 MED ORDER — FENTANYL CITRATE (PF) 100 MCG/2ML IJ SOLN
INTRAMUSCULAR | Status: DC | PRN
  Administered 2015-11-13: 50 ug via INTRAVENOUS

## 2015-11-13 MED ORDER — CEFUROXIME OPHTHALMIC INJECTION 1 MG/0.1 ML
INJECTION | OPHTHALMIC | Status: DC | PRN
  Administered 2015-11-13: .1 mL via INTRACAMERAL

## 2015-11-13 MED ORDER — MIDAZOLAM HCL 2 MG/2ML IJ SOLN
INTRAMUSCULAR | Status: DC | PRN
  Administered 2015-11-13: 1 mg via INTRAVENOUS

## 2015-11-13 MED ORDER — EPINEPHRINE HCL 1 MG/ML IJ SOLN
INTRAOCULAR | Status: DC | PRN
  Administered 2015-11-13: 1 mL via OPHTHALMIC

## 2015-11-13 MED ORDER — POVIDONE-IODINE 5 % OP SOLN
OPHTHALMIC | Status: AC
  Administered 2015-11-13: 1 via OPHTHALMIC
  Filled 2015-11-13: qty 30

## 2015-11-13 MED ORDER — MOXIFLOXACIN HCL 0.5 % OP SOLN
OPHTHALMIC | Status: DC | PRN
  Administered 2015-11-13: 1 [drp] via OPHTHALMIC

## 2015-11-13 MED ORDER — CARBACHOL 0.01 % IO SOLN
INTRAOCULAR | Status: DC | PRN
  Administered 2015-11-13: .5 mL via INTRAOCULAR

## 2015-11-13 MED ORDER — NA CHONDROIT SULF-NA HYALURON 40-17 MG/ML IO SOLN
INTRAOCULAR | Status: AC
  Filled 2015-11-13: qty 1

## 2015-11-13 MED ORDER — ARMC OPHTHALMIC DILATING GEL
OPHTHALMIC | Status: AC
  Administered 2015-11-13: 1 via OPHTHALMIC
  Filled 2015-11-13: qty 0.25

## 2015-11-13 MED ORDER — POVIDONE-IODINE 5 % OP SOLN
1.0000 "application " | OPHTHALMIC | Status: AC | PRN
  Administered 2015-11-13: 1 via OPHTHALMIC

## 2015-11-13 MED ORDER — SODIUM CHLORIDE 0.9 % IV SOLN
INTRAVENOUS | Status: DC
  Administered 2015-11-13 (×2): via INTRAVENOUS

## 2015-11-13 MED ORDER — CEFUROXIME OPHTHALMIC INJECTION 1 MG/0.1 ML
INJECTION | OPHTHALMIC | Status: AC
  Filled 2015-11-13: qty 0.1

## 2015-11-13 MED ORDER — NA CHONDROIT SULF-NA HYALURON 40-17 MG/ML IO SOLN
INTRAOCULAR | Status: DC | PRN
  Administered 2015-11-13: 1 mL via INTRAOCULAR

## 2015-11-13 MED ORDER — TETRACAINE HCL 0.5 % OP SOLN
1.0000 [drp] | OPHTHALMIC | Status: AC | PRN
  Administered 2015-11-13: 1 [drp] via OPHTHALMIC

## 2015-11-13 MED ORDER — CARVEDILOL 25 MG PO TABS
ORAL_TABLET | ORAL | Status: AC
  Filled 2015-11-13: qty 1

## 2015-11-13 MED ORDER — MOXIFLOXACIN HCL 0.5 % OP SOLN
1.0000 [drp] | OPHTHALMIC | Status: DC | PRN
Start: 2015-11-13 — End: 2015-11-13

## 2015-11-13 MED ORDER — ARMC OPHTHALMIC DILATING GEL
1.0000 "application " | OPHTHALMIC | Status: AC | PRN
  Administered 2015-11-13 (×2): 1 via OPHTHALMIC

## 2015-11-13 MED ORDER — EPINEPHRINE HCL 1 MG/ML IJ SOLN
INTRAMUSCULAR | Status: AC
  Filled 2015-11-13: qty 1

## 2015-11-13 MED ORDER — MOXIFLOXACIN HCL 0.5 % OP SOLN
OPHTHALMIC | Status: DC
Start: 2015-11-13 — End: 2015-11-13
  Filled 2015-11-13: qty 3

## 2015-11-13 SURGICAL SUPPLY — 22 items
CANNULA ANT/CHMB 27GA (MISCELLANEOUS) ×3 IMPLANT
CUP MEDICINE 2OZ PLAST GRAD ST (MISCELLANEOUS) ×3 IMPLANT
GLOVE BIO SURGEON STRL SZ8 (GLOVE) ×3 IMPLANT
GLOVE BIOGEL M 6.5 STRL (GLOVE) ×3 IMPLANT
GLOVE SURG LX 8.0 MICRO (GLOVE) ×2
GLOVE SURG LX STRL 8.0 MICRO (GLOVE) ×1 IMPLANT
GOWN STRL REUS W/ TWL LRG LVL3 (GOWN DISPOSABLE) ×2 IMPLANT
GOWN STRL REUS W/TWL LRG LVL3 (GOWN DISPOSABLE) ×4
LENS IOL TECNIS 24.0 (Intraocular Lens) ×3 IMPLANT
LENS IOL TECNIS MONO 1P 24.0 (Intraocular Lens) ×1 IMPLANT
PACK CATARACT (MISCELLANEOUS) ×3 IMPLANT
PACK CATARACT BRASINGTON LX (MISCELLANEOUS) ×3 IMPLANT
PACK EYE AFTER SURG (MISCELLANEOUS) ×3 IMPLANT
SOL BSS BAG (MISCELLANEOUS) ×3
SOL PREP PVP 2OZ (MISCELLANEOUS) ×3
SOLUTION BSS BAG (MISCELLANEOUS) ×1 IMPLANT
SOLUTION PREP PVP 2OZ (MISCELLANEOUS) ×1 IMPLANT
SYR 3ML LL SCALE MARK (SYRINGE) ×3 IMPLANT
SYR 5ML LL (SYRINGE) ×3 IMPLANT
SYR TB 1ML 27GX1/2 LL (SYRINGE) ×3 IMPLANT
WATER STERILE IRR 1000ML POUR (IV SOLUTION) ×3 IMPLANT
WIPE NON LINTING 3.25X3.25 (MISCELLANEOUS) ×3 IMPLANT

## 2015-11-13 NOTE — Transfer of Care (Signed)
Immediate Anesthesia Transfer of Care Note  Patient: [name] [name]  Procedure(s) Performed: Procedure(s) with comments: CATARACT EXTRACTION PHACO AND INTRAOCULAR LENS PLACEMENT (IOC) (Right) - [place]  00:35 AP% 21.1 CDE 7.44 fluid pack lot [id number]  Patient Location: PACU  Anesthesia Type:MAC  Level of Consciousness: awake, alert  and oriented  Airway & Oxygen Therapy: Patient Spontanous Breathing  Post-op Assessment: Report given to RN and Post -op Vital signs reviewed and stable  Post vital signs: Reviewed and stable  Last Vitals:  Filed Vitals:   11/13/15 0608  BP: 173/81  Pulse: 86  Temp: 36.7 C  Resp: 16    Complications: No apparent anesthesia complications

## 2015-11-13 NOTE — H&P (Signed)
  All labs reviewed. Abnormal studies sent to patients PCP when indicated.  Previous H&P reviewed, patient examined, there are NO CHANGES.  [name]1/3/20177:23 AM

## 2015-11-13 NOTE — Anesthesia Postprocedure Evaluation (Signed)
Anesthesia Post Note  Patient: [name] [name]  Procedure(s) Performed: Procedure(s) (LRB): CATARACT EXTRACTION PHACO AND INTRAOCULAR LENS PLACEMENT (IOC) (Right)  Patient location during evaluation: PACU Anesthesia Type: MAC Level of consciousness: awake, awake and alert and oriented Pain management: pain level controlled Vital Signs Assessment: post-procedure vital signs reviewed and stable Respiratory status: spontaneous breathing and nonlabored ventilation Cardiovascular status: stable and blood pressure returned to baseline Postop Assessment: no headache Anesthetic complications: no    Last Vitals:  Filed Vitals:   11/13/15 0608  BP: 173/81  Pulse: 86  Temp: 36.7 C  Resp: 16    Last Pain: There were no vitals filed for this visit.               Yosef Krogh,  [employer]

## 2015-11-13 NOTE — Anesthesia Preprocedure Evaluation (Signed)
Anesthesia Evaluation  Patient identified by MRN, date of birth, ID band Patient awake    Reviewed: Allergy & Precautions, NPO status , Patient's Chart, lab work & pertinent test results  History of Anesthesia Complications Negative for: history of anesthetic complications  Airway Mallampati: II       Dental  (+) Teeth Intact   Pulmonary neg pulmonary ROS, former smoker,    breath sounds clear to auscultation       Cardiovascular hypertension, Pt. on home beta blockers  Rhythm:Regular     Neuro/Psych Depression    GI/Hepatic Neg liver ROS, GERD  ,  Endo/Other  diabetes, Well Controlled, Type 2, Oral Hypoglycemic Agents  Renal/GU      Musculoskeletal   Abdominal (+) + obese,   Peds  Hematology   Anesthesia Other Findings    Reproductive/Obstetrics                             Anesthesia Physical  Anesthesia Plan  ASA: III  Anesthesia Plan: MAC   Post-op Pain Management:    Induction: Intravenous  Airway Management Planned: Nasal Cannula  Additional Equipment:   Intra-op Plan:   Post-operative Plan:   Informed Consent: I have reviewed the patients History and Physical, chart, labs and discussed the procedure including the risks, benefits and alternatives for the proposed anesthesia with the patient or authorized representative who has indicated his/her understanding and acceptance.     Plan Discussed with: CRNA  Anesthesia Plan Comments:         Anesthesia Quick Evaluation

## 2015-11-13 NOTE — Op Note (Signed)
PREOPERATIVE DIAGNOSIS:  Nuclear sclerotic cataract of the right eye.   POSTOPERATIVE DIAGNOSIS: nuclear sclerotiuc cataract right eye   OPERATIVE PROCEDURE:  Procedure(s): CATARACT EXTRACTION PHACO AND INTRAOCULAR LENS PLACEMENT (IOC)   SURGEON:  Birder Robson, MD.   ANESTHESIA:  Anesthesiologist: Martha Clan, MD CRNA: Nelda Marseille, CRNA  1.      Managed anesthesia care. 2.      Topical tetracaine drops followed by 2% Xylocaine jelly applied in the preoperative holding area.   COMPLICATIONS:  None.   TECHNIQUE:   Stop and chop   DESCRIPTION OF PROCEDURE:  The patient was examined and consented in the preoperative holding area where the aforementioned topical anesthesia was applied to the right eye and then brought back to the Operating Room where the right eye was prepped and draped in the usual sterile ophthalmic fashion and a lid speculum was placed. A paracentesis was created with the side port blade and the anterior chamber was filled with viscoelastic. A near clear corneal incision was performed with the steel keratome. A continuous curvilinear capsulorrhexis was performed with a cystotome followed by the capsulorrhexis forceps. Hydrodissection and hydrodelineation were carried out with BSS on a blunt cannula. The lens was removed in a stop and chop  technique and the remaining cortical material was removed with the irrigation-aspiration handpiece. The capsular bag was inflated with viscoelastic and the Technis ZCB00  lens was placed in the capsular bag without complication. The remaining viscoelastic was removed from the eye with the irrigation-aspiration handpiece. The wounds were hydrated. The anterior chamber was flushed with Miostat and the eye was inflated to physiologic pressure. 0.1 mL of cefuroxime concentration 10 mg/mL was placed in the anterior chamber. The wounds were found to be water tight. The eye was dressed with Vigamox. The patient was given protective glasses to wear  throughout the day and a shield with which to sleep tonight. The patient was also given drops with which to begin a drop regimen today and will follow-up with me in one day.  Implant Name Type Inv. Item Serial No. Manufacturer Lot No. LRB No. Used  LENS IMPL INTRAOC ZCB00 24.0 - [id number] Intraocular Lens LENS IMPL INTRAOC ZCB00 24.0 [id number]   Right 1   Procedure(s) with comments: CATARACT EXTRACTION PHACO AND INTRAOCULAR LENS PLACEMENT (IOC) (Right) - [place]  00:35 AP% 21.1 CDE 7.44 fluid pack lot [id number]  Electronically signed: [place] 11/13/2015 7:46 AM

## 2015-11-13 NOTE — Anesthesia Procedure Notes (Signed)
Date/Time: 11/13/2015 7:35 AM Performed by: Nelda Marseille Pre-anesthesia Checklist: Patient identified, Emergency Drugs available, Suction available, Patient being monitored and Timeout performed Oxygen Delivery Method: Nasal cannula

## 2015-11-14 ENCOUNTER — Encounter: Payer: Self-pay | Admitting: Ophthalmology

## 2015-11-15 DIAGNOSIS — I10 Essential (primary) hypertension: Secondary | ICD-10-CM | POA: Diagnosis not present

## 2015-11-15 DIAGNOSIS — N183 Chronic kidney disease, stage 3 (moderate): Secondary | ICD-10-CM | POA: Diagnosis not present

## 2015-11-15 DIAGNOSIS — E119 Type 2 diabetes mellitus without complications: Secondary | ICD-10-CM | POA: Diagnosis not present

## 2015-11-15 DIAGNOSIS — Z794 Long term (current) use of insulin: Secondary | ICD-10-CM | POA: Diagnosis not present

## 2015-11-20 DIAGNOSIS — C61 Malignant neoplasm of prostate: Secondary | ICD-10-CM | POA: Diagnosis not present

## 2015-11-20 DIAGNOSIS — R918 Other nonspecific abnormal finding of lung field: Secondary | ICD-10-CM | POA: Diagnosis not present

## 2015-11-20 DIAGNOSIS — J9811 Atelectasis: Secondary | ICD-10-CM | POA: Diagnosis not present

## 2015-12-13 DIAGNOSIS — Z23 Encounter for immunization: Secondary | ICD-10-CM | POA: Diagnosis not present

## 2015-12-13 DIAGNOSIS — G629 Polyneuropathy, unspecified: Secondary | ICD-10-CM | POA: Diagnosis not present

## 2015-12-13 DIAGNOSIS — N183 Chronic kidney disease, stage 3 (moderate): Secondary | ICD-10-CM | POA: Diagnosis not present

## 2015-12-13 DIAGNOSIS — E119 Type 2 diabetes mellitus without complications: Secondary | ICD-10-CM | POA: Diagnosis not present

## 2015-12-13 DIAGNOSIS — I10 Essential (primary) hypertension: Secondary | ICD-10-CM | POA: Diagnosis not present

## 2015-12-27 DIAGNOSIS — C61 Malignant neoplasm of prostate: Secondary | ICD-10-CM | POA: Diagnosis not present

## 2016-01-17 DIAGNOSIS — C61 Malignant neoplasm of prostate: Secondary | ICD-10-CM | POA: Diagnosis not present

## 2016-01-17 DIAGNOSIS — N183 Chronic kidney disease, stage 3 (moderate): Secondary | ICD-10-CM | POA: Diagnosis not present

## 2016-01-17 DIAGNOSIS — I10 Essential (primary) hypertension: Secondary | ICD-10-CM | POA: Diagnosis not present

## 2016-01-17 DIAGNOSIS — E119 Type 2 diabetes mellitus without complications: Secondary | ICD-10-CM | POA: Diagnosis not present

## 2016-02-12 DIAGNOSIS — I129 Hypertensive chronic kidney disease with stage 1 through stage 4 chronic kidney disease, or unspecified chronic kidney disease: Secondary | ICD-10-CM | POA: Diagnosis not present

## 2016-02-12 DIAGNOSIS — E1142 Type 2 diabetes mellitus with diabetic polyneuropathy: Secondary | ICD-10-CM | POA: Diagnosis not present

## 2016-02-12 DIAGNOSIS — C7800 Secondary malignant neoplasm of unspecified lung: Secondary | ICD-10-CM | POA: Diagnosis not present

## 2016-02-12 DIAGNOSIS — Z8546 Personal history of malignant neoplasm of prostate: Secondary | ICD-10-CM | POA: Diagnosis not present

## 2016-02-12 DIAGNOSIS — Z79899 Other long term (current) drug therapy: Secondary | ICD-10-CM | POA: Diagnosis not present

## 2016-02-12 DIAGNOSIS — D649 Anemia, unspecified: Secondary | ICD-10-CM | POA: Diagnosis not present

## 2016-02-12 DIAGNOSIS — E1165 Type 2 diabetes mellitus with hyperglycemia: Secondary | ICD-10-CM | POA: Diagnosis not present

## 2016-02-12 DIAGNOSIS — E1122 Type 2 diabetes mellitus with diabetic chronic kidney disease: Secondary | ICD-10-CM | POA: Diagnosis not present

## 2016-02-12 DIAGNOSIS — N183 Chronic kidney disease, stage 3 (moderate): Secondary | ICD-10-CM | POA: Diagnosis not present

## 2016-02-12 DIAGNOSIS — I10 Essential (primary) hypertension: Secondary | ICD-10-CM | POA: Diagnosis not present

## 2016-02-18 DIAGNOSIS — N183 Chronic kidney disease, stage 3 (moderate): Secondary | ICD-10-CM | POA: Diagnosis not present

## 2016-02-18 DIAGNOSIS — I10 Essential (primary) hypertension: Secondary | ICD-10-CM | POA: Diagnosis not present

## 2016-02-18 DIAGNOSIS — E119 Type 2 diabetes mellitus without complications: Secondary | ICD-10-CM | POA: Diagnosis not present

## 2016-03-10 DIAGNOSIS — E1165 Type 2 diabetes mellitus with hyperglycemia: Secondary | ICD-10-CM | POA: Diagnosis not present

## 2016-03-20 DIAGNOSIS — K4090 Unilateral inguinal hernia, without obstruction or gangrene, not specified as recurrent: Secondary | ICD-10-CM | POA: Diagnosis not present

## 2016-03-20 DIAGNOSIS — N261 Atrophy of kidney (terminal): Secondary | ICD-10-CM | POA: Diagnosis not present

## 2016-03-20 DIAGNOSIS — K760 Fatty (change of) liver, not elsewhere classified: Secondary | ICD-10-CM | POA: Diagnosis not present

## 2016-03-20 DIAGNOSIS — R918 Other nonspecific abnormal finding of lung field: Secondary | ICD-10-CM | POA: Diagnosis not present

## 2016-03-20 DIAGNOSIS — R9341 Abnormal radiologic findings on diagnostic imaging of renal pelvis, ureter, or bladder: Secondary | ICD-10-CM | POA: Diagnosis not present

## 2016-03-20 DIAGNOSIS — C61 Malignant neoplasm of prostate: Secondary | ICD-10-CM | POA: Diagnosis not present

## 2016-03-20 DIAGNOSIS — N323 Diverticulum of bladder: Secondary | ICD-10-CM | POA: Diagnosis not present

## 2016-03-20 DIAGNOSIS — R591 Generalized enlarged lymph nodes: Secondary | ICD-10-CM | POA: Diagnosis not present

## 2016-03-20 DIAGNOSIS — K5730 Diverticulosis of large intestine without perforation or abscess without bleeding: Secondary | ICD-10-CM | POA: Diagnosis not present

## 2016-03-20 DIAGNOSIS — M47819 Spondylosis without myelopathy or radiculopathy, site unspecified: Secondary | ICD-10-CM | POA: Diagnosis not present

## 2016-03-27 DIAGNOSIS — C61 Malignant neoplasm of prostate: Secondary | ICD-10-CM | POA: Diagnosis not present

## 2016-03-27 DIAGNOSIS — R590 Localized enlarged lymph nodes: Secondary | ICD-10-CM | POA: Diagnosis not present

## 2016-03-27 DIAGNOSIS — Z79899 Other long term (current) drug therapy: Secondary | ICD-10-CM | POA: Diagnosis not present

## 2016-03-27 DIAGNOSIS — G8929 Other chronic pain: Secondary | ICD-10-CM | POA: Diagnosis not present

## 2016-03-27 DIAGNOSIS — C7800 Secondary malignant neoplasm of unspecified lung: Secondary | ICD-10-CM | POA: Diagnosis not present

## 2016-03-27 DIAGNOSIS — M549 Dorsalgia, unspecified: Secondary | ICD-10-CM | POA: Diagnosis not present

## 2016-03-27 DIAGNOSIS — R918 Other nonspecific abnormal finding of lung field: Secondary | ICD-10-CM | POA: Diagnosis not present

## 2016-05-19 DIAGNOSIS — E119 Type 2 diabetes mellitus without complications: Secondary | ICD-10-CM | POA: Diagnosis not present

## 2016-05-19 DIAGNOSIS — I10 Essential (primary) hypertension: Secondary | ICD-10-CM | POA: Diagnosis not present

## 2016-06-04 DIAGNOSIS — E119 Type 2 diabetes mellitus without complications: Secondary | ICD-10-CM | POA: Diagnosis not present

## 2016-06-11 DIAGNOSIS — E119 Type 2 diabetes mellitus without complications: Secondary | ICD-10-CM | POA: Diagnosis not present

## 2016-06-26 DIAGNOSIS — C61 Malignant neoplasm of prostate: Secondary | ICD-10-CM | POA: Diagnosis not present

## 2016-06-26 DIAGNOSIS — G629 Polyneuropathy, unspecified: Secondary | ICD-10-CM | POA: Diagnosis not present

## 2016-06-26 DIAGNOSIS — Z79818 Long term (current) use of other agents affecting estrogen receptors and estrogen levels: Secondary | ICD-10-CM | POA: Diagnosis not present

## 2016-06-26 DIAGNOSIS — G8929 Other chronic pain: Secondary | ICD-10-CM | POA: Diagnosis not present

## 2016-06-26 DIAGNOSIS — R918 Other nonspecific abnormal finding of lung field: Secondary | ICD-10-CM | POA: Diagnosis not present

## 2016-06-26 DIAGNOSIS — R590 Localized enlarged lymph nodes: Secondary | ICD-10-CM | POA: Diagnosis not present

## 2016-06-30 DIAGNOSIS — E7800 Pure hypercholesterolemia, unspecified: Secondary | ICD-10-CM | POA: Diagnosis not present

## 2016-06-30 DIAGNOSIS — R195 Other fecal abnormalities: Secondary | ICD-10-CM | POA: Diagnosis not present

## 2016-06-30 DIAGNOSIS — I10 Essential (primary) hypertension: Secondary | ICD-10-CM | POA: Diagnosis not present

## 2016-06-30 DIAGNOSIS — Z794 Long term (current) use of insulin: Secondary | ICD-10-CM | POA: Diagnosis not present

## 2016-06-30 DIAGNOSIS — E118 Type 2 diabetes mellitus with unspecified complications: Secondary | ICD-10-CM | POA: Diagnosis not present

## 2016-06-30 DIAGNOSIS — E119 Type 2 diabetes mellitus without complications: Secondary | ICD-10-CM | POA: Diagnosis not present

## 2016-09-25 DIAGNOSIS — R918 Other nonspecific abnormal finding of lung field: Secondary | ICD-10-CM | POA: Diagnosis not present

## 2016-09-25 DIAGNOSIS — Z8781 Personal history of (healed) traumatic fracture: Secondary | ICD-10-CM | POA: Diagnosis not present

## 2016-09-25 DIAGNOSIS — Z5112 Encounter for antineoplastic immunotherapy: Secondary | ICD-10-CM | POA: Diagnosis not present

## 2016-09-25 DIAGNOSIS — C61 Malignant neoplasm of prostate: Secondary | ICD-10-CM | POA: Diagnosis not present

## 2016-09-25 DIAGNOSIS — Z7982 Long term (current) use of aspirin: Secondary | ICD-10-CM | POA: Diagnosis not present

## 2016-09-25 DIAGNOSIS — G8929 Other chronic pain: Secondary | ICD-10-CM | POA: Diagnosis not present

## 2016-09-25 DIAGNOSIS — Z888 Allergy status to other drugs, medicaments and biological substances status: Secondary | ICD-10-CM | POA: Diagnosis not present

## 2016-09-25 DIAGNOSIS — G629 Polyneuropathy, unspecified: Secondary | ICD-10-CM | POA: Diagnosis not present

## 2016-09-25 DIAGNOSIS — R590 Localized enlarged lymph nodes: Secondary | ICD-10-CM | POA: Diagnosis not present

## 2016-09-25 DIAGNOSIS — M549 Dorsalgia, unspecified: Secondary | ICD-10-CM | POA: Diagnosis not present

## 2016-09-25 DIAGNOSIS — Z79899 Other long term (current) drug therapy: Secondary | ICD-10-CM | POA: Diagnosis not present

## 2016-10-07 DIAGNOSIS — E119 Type 2 diabetes mellitus without complications: Secondary | ICD-10-CM | POA: Diagnosis not present

## 2016-10-07 DIAGNOSIS — G629 Polyneuropathy, unspecified: Secondary | ICD-10-CM | POA: Diagnosis not present

## 2016-10-07 DIAGNOSIS — I10 Essential (primary) hypertension: Secondary | ICD-10-CM | POA: Diagnosis not present

## 2016-10-07 DIAGNOSIS — E7800 Pure hypercholesterolemia, unspecified: Secondary | ICD-10-CM | POA: Diagnosis not present

## 2016-10-07 DIAGNOSIS — N183 Chronic kidney disease, stage 3 (moderate): Secondary | ICD-10-CM | POA: Diagnosis not present

## 2016-10-07 DIAGNOSIS — Z23 Encounter for immunization: Secondary | ICD-10-CM | POA: Diagnosis not present

## 2016-12-25 DIAGNOSIS — Z923 Personal history of irradiation: Secondary | ICD-10-CM | POA: Diagnosis not present

## 2016-12-25 DIAGNOSIS — N529 Male erectile dysfunction, unspecified: Secondary | ICD-10-CM | POA: Diagnosis not present

## 2016-12-25 DIAGNOSIS — Z888 Allergy status to other drugs, medicaments and biological substances status: Secondary | ICD-10-CM | POA: Diagnosis not present

## 2016-12-25 DIAGNOSIS — C61 Malignant neoplasm of prostate: Secondary | ICD-10-CM | POA: Diagnosis not present

## 2016-12-25 DIAGNOSIS — Z794 Long term (current) use of insulin: Secondary | ICD-10-CM | POA: Diagnosis not present

## 2016-12-25 DIAGNOSIS — Z7982 Long term (current) use of aspirin: Secondary | ICD-10-CM | POA: Diagnosis not present

## 2016-12-25 DIAGNOSIS — Z79818 Long term (current) use of other agents affecting estrogen receptors and estrogen levels: Secondary | ICD-10-CM | POA: Diagnosis not present

## 2016-12-25 DIAGNOSIS — M549 Dorsalgia, unspecified: Secondary | ICD-10-CM | POA: Diagnosis not present

## 2016-12-25 DIAGNOSIS — G629 Polyneuropathy, unspecified: Secondary | ICD-10-CM | POA: Diagnosis not present

## 2016-12-25 DIAGNOSIS — G8929 Other chronic pain: Secondary | ICD-10-CM | POA: Diagnosis not present

## 2017-01-07 DIAGNOSIS — I10 Essential (primary) hypertension: Secondary | ICD-10-CM | POA: Diagnosis not present

## 2017-01-07 DIAGNOSIS — E119 Type 2 diabetes mellitus without complications: Secondary | ICD-10-CM | POA: Diagnosis not present

## 2017-01-07 DIAGNOSIS — E7800 Pure hypercholesterolemia, unspecified: Secondary | ICD-10-CM | POA: Diagnosis not present

## 2017-01-21 DIAGNOSIS — E119 Type 2 diabetes mellitus without complications: Secondary | ICD-10-CM | POA: Diagnosis not present

## 2017-02-12 DIAGNOSIS — E119 Type 2 diabetes mellitus without complications: Secondary | ICD-10-CM | POA: Diagnosis not present

## 2017-03-26 DIAGNOSIS — E1122 Type 2 diabetes mellitus with diabetic chronic kidney disease: Secondary | ICD-10-CM | POA: Diagnosis not present

## 2017-03-26 DIAGNOSIS — M1A09X0 Idiopathic chronic gout, multiple sites, without tophus (tophi): Secondary | ICD-10-CM | POA: Diagnosis not present

## 2017-03-26 DIAGNOSIS — E1165 Type 2 diabetes mellitus with hyperglycemia: Secondary | ICD-10-CM | POA: Diagnosis not present

## 2017-03-26 DIAGNOSIS — N183 Chronic kidney disease, stage 3 (moderate): Secondary | ICD-10-CM | POA: Diagnosis not present

## 2017-03-26 DIAGNOSIS — I10 Essential (primary) hypertension: Secondary | ICD-10-CM | POA: Diagnosis not present

## 2017-03-26 DIAGNOSIS — E7800 Pure hypercholesterolemia, unspecified: Secondary | ICD-10-CM | POA: Diagnosis not present

## 2017-04-02 DIAGNOSIS — G629 Polyneuropathy, unspecified: Secondary | ICD-10-CM | POA: Diagnosis not present

## 2017-04-02 DIAGNOSIS — Z79899 Other long term (current) drug therapy: Secondary | ICD-10-CM | POA: Diagnosis not present

## 2017-04-02 DIAGNOSIS — N529 Male erectile dysfunction, unspecified: Secondary | ICD-10-CM | POA: Diagnosis not present

## 2017-04-02 DIAGNOSIS — R9721 Rising PSA following treatment for malignant neoplasm of prostate: Secondary | ICD-10-CM | POA: Diagnosis not present

## 2017-04-02 DIAGNOSIS — Z794 Long term (current) use of insulin: Secondary | ICD-10-CM | POA: Diagnosis not present

## 2017-04-02 DIAGNOSIS — R918 Other nonspecific abnormal finding of lung field: Secondary | ICD-10-CM | POA: Diagnosis not present

## 2017-04-02 DIAGNOSIS — C61 Malignant neoplasm of prostate: Secondary | ICD-10-CM | POA: Diagnosis not present

## 2017-04-02 DIAGNOSIS — Z888 Allergy status to other drugs, medicaments and biological substances status: Secondary | ICD-10-CM | POA: Diagnosis not present

## 2017-04-02 DIAGNOSIS — R590 Localized enlarged lymph nodes: Secondary | ICD-10-CM | POA: Diagnosis not present

## 2017-04-02 DIAGNOSIS — Z5112 Encounter for antineoplastic immunotherapy: Secondary | ICD-10-CM | POA: Diagnosis not present

## 2017-04-02 DIAGNOSIS — M549 Dorsalgia, unspecified: Secondary | ICD-10-CM | POA: Diagnosis not present

## 2017-04-07 DIAGNOSIS — E119 Type 2 diabetes mellitus without complications: Secondary | ICD-10-CM | POA: Diagnosis not present

## 2017-04-07 DIAGNOSIS — N183 Chronic kidney disease, stage 3 (moderate): Secondary | ICD-10-CM | POA: Diagnosis not present

## 2017-04-07 DIAGNOSIS — I10 Essential (primary) hypertension: Secondary | ICD-10-CM | POA: Diagnosis not present

## 2017-06-03 MED ORDER — INSULIN GLARGINE (U-100) 100 UNIT/ML (3 ML) SUBCUTANEOUS PEN
Freq: Every day | SUBCUTANEOUS | 3 refills | 0.00000 days | Status: CP
Start: 2017-06-03 — End: 2017-06-25

## 2017-06-25 MED ORDER — ALLOPURINOL 100 MG TABLET
ORAL_TABLET | Freq: Every day | ORAL | 3 refills | 0 days | Status: CP
Start: 2017-06-25 — End: 2017-09-25

## 2017-06-25 MED ORDER — INSULIN GLARGINE (U-100) 100 UNIT/ML (3 ML) SUBCUTANEOUS PEN
Freq: Every day | SUBCUTANEOUS | 3 refills | 0 days | Status: CP
Start: 2017-06-25 — End: 2017-08-25

## 2017-07-02 MED ORDER — HYDROCHLOROTHIAZIDE 12.5 MG TABLET
ORAL_TABLET | Freq: Every day | ORAL | prn refills | 0 days | Status: CP
Start: 2017-07-02 — End: 2017-08-12

## 2017-07-08 ENCOUNTER — Ambulatory Visit
Admission: RE | Admit: 2017-07-08 | Discharge: 2017-07-08 | Disposition: A | Discharge status: Home / Self Care | Payer: MEDICARE

## 2017-07-08 DIAGNOSIS — E118 Type 2 diabetes mellitus with unspecified complications: Secondary | ICD-10-CM | POA: Diagnosis not present

## 2017-07-08 DIAGNOSIS — E119 Type 2 diabetes mellitus without complications: Secondary | ICD-10-CM | POA: Diagnosis not present

## 2017-07-08 DIAGNOSIS — Z794 Long term (current) use of insulin: Secondary | ICD-10-CM | POA: Diagnosis not present

## 2017-07-08 DIAGNOSIS — I10 Essential (primary) hypertension: Secondary | ICD-10-CM | POA: Diagnosis not present

## 2017-07-08 DIAGNOSIS — N183 Chronic kidney disease, stage 3 (moderate): Secondary | ICD-10-CM | POA: Diagnosis not present

## 2017-07-08 MED ORDER — INSULIN ASPART (U-100) 100 UNIT/ML (3 ML) SUBCUTANEOUS PEN
prn refills | 0 days | Status: CP
Start: 2017-07-08 — End: 2017-08-12

## 2017-07-14 MED ORDER — LOSARTAN 100 MG TABLET
ORAL_TABLET | 3 refills | 0 days | Status: CP
Start: 2017-07-14 — End: 2017-10-14

## 2017-07-15 DIAGNOSIS — E1142 Type 2 diabetes mellitus with diabetic polyneuropathy: Secondary | ICD-10-CM | POA: Diagnosis not present

## 2017-08-03 ENCOUNTER — Ambulatory Visit
Admission: RE | Admit: 2017-08-03 | Discharge: 2017-08-03 | Disposition: A | Discharge status: Home / Self Care | Payer: MEDICARE

## 2017-08-03 ENCOUNTER — Ambulatory Visit
Admission: RE | Admit: 2017-08-03 | Discharge: 2017-08-03 | Disposition: A | Discharge status: Home / Self Care | Payer: MEDICARE | Attending: Nurse Practitioner | Admitting: Nurse Practitioner

## 2017-08-03 DIAGNOSIS — Z5112 Encounter for antineoplastic immunotherapy: Secondary | ICD-10-CM | POA: Diagnosis not present

## 2017-08-03 DIAGNOSIS — Z794 Long term (current) use of insulin: Secondary | ICD-10-CM | POA: Diagnosis not present

## 2017-08-03 DIAGNOSIS — R590 Localized enlarged lymph nodes: Secondary | ICD-10-CM | POA: Diagnosis not present

## 2017-08-03 DIAGNOSIS — M549 Dorsalgia, unspecified: Secondary | ICD-10-CM | POA: Diagnosis not present

## 2017-08-03 DIAGNOSIS — Z888 Allergy status to other drugs, medicaments and biological substances status: Secondary | ICD-10-CM | POA: Diagnosis not present

## 2017-08-03 DIAGNOSIS — N529 Male erectile dysfunction, unspecified: Secondary | ICD-10-CM | POA: Diagnosis not present

## 2017-08-03 DIAGNOSIS — R918 Other nonspecific abnormal finding of lung field: Secondary | ICD-10-CM | POA: Diagnosis not present

## 2017-08-03 DIAGNOSIS — C61 Malignant neoplasm of prostate: Secondary | ICD-10-CM | POA: Diagnosis not present

## 2017-08-03 DIAGNOSIS — G629 Polyneuropathy, unspecified: Secondary | ICD-10-CM | POA: Diagnosis not present

## 2017-08-13 DIAGNOSIS — Z794 Long term (current) use of insulin: Secondary | ICD-10-CM | POA: Diagnosis not present

## 2017-08-13 DIAGNOSIS — E118 Type 2 diabetes mellitus with unspecified complications: Secondary | ICD-10-CM | POA: Diagnosis not present

## 2017-08-13 DIAGNOSIS — I10 Essential (primary) hypertension: Secondary | ICD-10-CM | POA: Diagnosis not present

## 2017-08-13 MED ORDER — HYDROCHLOROTHIAZIDE 12.5 MG TABLET
ORAL_TABLET | Freq: Every day | ORAL | 3 refills | 0.00000 days | Status: CP
Start: 2017-08-13 — End: 2017-12-07

## 2017-08-13 MED ORDER — INSULIN ASPART (U-100) 100 UNIT/ML (3 ML) SUBCUTANEOUS PEN
2 refills | 0 days | Status: CP
Start: 2017-08-13 — End: 2017-10-09

## 2017-08-25 MED ORDER — INSULIN GLARGINE (U-100) 100 UNIT/ML (3 ML) SUBCUTANEOUS PEN
Freq: Every day | SUBCUTANEOUS | 3 refills | 0.00000 days | Status: CP
Start: 2017-08-25 — End: 2017-10-09

## 2017-09-16 DIAGNOSIS — Z794 Long term (current) use of insulin: Secondary | ICD-10-CM | POA: Diagnosis not present

## 2017-09-16 DIAGNOSIS — E118 Type 2 diabetes mellitus with unspecified complications: Secondary | ICD-10-CM | POA: Diagnosis not present

## 2017-09-25 MED ORDER — ALLOPURINOL 100 MG TABLET
ORAL_TABLET | Freq: Every day | ORAL | 1 refills | 0 days | Status: CP
Start: 2017-09-25 — End: 2017-12-29

## 2017-10-09 ENCOUNTER — Ambulatory Visit: Admission: RE | Admit: 2017-10-09 | Discharge: 2017-10-09 | Payer: MEDICARE | Admitting: Physician Assistant

## 2017-10-09 DIAGNOSIS — N183 Chronic kidney disease, stage 3 (moderate): Secondary | ICD-10-CM | POA: Diagnosis not present

## 2017-10-09 DIAGNOSIS — E118 Type 2 diabetes mellitus with unspecified complications: Secondary | ICD-10-CM | POA: Diagnosis not present

## 2017-10-09 DIAGNOSIS — E119 Type 2 diabetes mellitus without complications: Secondary | ICD-10-CM | POA: Diagnosis not present

## 2017-10-09 DIAGNOSIS — Z23 Encounter for immunization: Secondary | ICD-10-CM | POA: Diagnosis not present

## 2017-10-09 DIAGNOSIS — Z794 Long term (current) use of insulin: Secondary | ICD-10-CM | POA: Diagnosis not present

## 2017-10-09 DIAGNOSIS — I10 Essential (primary) hypertension: Secondary | ICD-10-CM | POA: Diagnosis not present

## 2017-10-09 MED ORDER — INSULIN GLARGINE (U-100) 100 UNIT/ML (3 ML) SUBCUTANEOUS PEN: 56 [IU] | mL | Freq: Every day | 3 refills | 0 days | Status: AC

## 2017-10-09 MED ORDER — INSULIN GLARGINE (U-100) 100 UNIT/ML (3 ML) SUBCUTANEOUS PEN
Freq: Every day | SUBCUTANEOUS | 3 refills | 0.00000 days | Status: CP
Start: 2017-10-09 — End: 2017-10-09

## 2017-10-09 MED ORDER — INSULIN ASPART (U-100) 100 UNIT/ML (3 ML) SUBCUTANEOUS PEN
prn refills | 0 days | Status: CP
Start: 2017-10-09 — End: 2017-10-14

## 2017-10-14 MED ORDER — INSULIN ASPART (U-100) 100 UNIT/ML (3 ML) SUBCUTANEOUS PEN
3 refills | 0 days | Status: CP
Start: 2017-10-14 — End: 2017-11-13

## 2017-10-14 MED ORDER — INSULIN GLARGINE (U-100) 100 UNIT/ML (3 ML) SUBCUTANEOUS PEN
Freq: Every day | SUBCUTANEOUS | 3 refills | 0 days | Status: CP
Start: 2017-10-14 — End: 2017-11-13

## 2017-10-14 MED ORDER — LOSARTAN 100 MG TABLET
ORAL_TABLET | Freq: Every day | ORAL | 3 refills | 0 days | Status: CP
Start: 2017-10-14 — End: 2018-01-09

## 2017-11-13 MED ORDER — INSULIN ASPART (U-100) 100 UNIT/ML (3 ML) SUBCUTANEOUS PEN
3 refills | 0 days | Status: CP
Start: 2017-11-13 — End: 2018-01-07

## 2017-11-13 MED ORDER — INSULIN GLARGINE (U-100) 100 UNIT/ML (3 ML) SUBCUTANEOUS PEN
Freq: Every day | SUBCUTANEOUS | 3 refills | 0.00000 days | Status: CP
Start: 2017-11-13 — End: 2017-12-07

## 2017-11-16 ENCOUNTER — Ambulatory Visit: Admit: 2017-11-16 | Discharge: 2017-11-16 | Payer: PRIVATE HEALTH INSURANCE

## 2017-11-16 ENCOUNTER — Other Ambulatory Visit: Admit: 2017-11-16 | Discharge: 2017-11-16 | Payer: PRIVATE HEALTH INSURANCE

## 2017-11-16 ENCOUNTER — Ambulatory Visit
Admit: 2017-11-16 | Discharge: 2017-11-16 | Payer: PRIVATE HEALTH INSURANCE | Attending: Nurse Practitioner | Primary: Nurse Practitioner

## 2017-11-16 DIAGNOSIS — Z8781 Personal history of (healed) traumatic fracture: Secondary | ICD-10-CM | POA: Diagnosis not present

## 2017-11-16 DIAGNOSIS — Z79899 Other long term (current) drug therapy: Secondary | ICD-10-CM | POA: Diagnosis not present

## 2017-11-16 DIAGNOSIS — I10 Essential (primary) hypertension: Secondary | ICD-10-CM | POA: Diagnosis not present

## 2017-11-16 DIAGNOSIS — E119 Type 2 diabetes mellitus without complications: Secondary | ICD-10-CM | POA: Diagnosis not present

## 2017-11-16 DIAGNOSIS — N529 Male erectile dysfunction, unspecified: Secondary | ICD-10-CM | POA: Diagnosis not present

## 2017-11-16 DIAGNOSIS — Z9079 Acquired absence of other genital organ(s): Secondary | ICD-10-CM | POA: Diagnosis not present

## 2017-11-16 DIAGNOSIS — Z794 Long term (current) use of insulin: Secondary | ICD-10-CM | POA: Diagnosis not present

## 2017-11-16 DIAGNOSIS — Z923 Personal history of irradiation: Secondary | ICD-10-CM | POA: Diagnosis not present

## 2017-11-16 DIAGNOSIS — G8929 Other chronic pain: Secondary | ICD-10-CM | POA: Diagnosis not present

## 2017-11-16 DIAGNOSIS — C7989 Secondary malignant neoplasm of other specified sites: Secondary | ICD-10-CM | POA: Diagnosis not present

## 2017-11-16 DIAGNOSIS — Z888 Allergy status to other drugs, medicaments and biological substances status: Secondary | ICD-10-CM | POA: Diagnosis not present

## 2017-11-16 DIAGNOSIS — M549 Dorsalgia, unspecified: Secondary | ICD-10-CM | POA: Diagnosis not present

## 2017-11-16 DIAGNOSIS — Z5112 Encounter for antineoplastic immunotherapy: Secondary | ICD-10-CM | POA: Diagnosis not present

## 2017-11-16 DIAGNOSIS — M25552 Pain in left hip: Secondary | ICD-10-CM | POA: Diagnosis not present

## 2017-11-16 DIAGNOSIS — C61 Malignant neoplasm of prostate: Secondary | ICD-10-CM | POA: Diagnosis not present

## 2017-11-16 DIAGNOSIS — M25551 Pain in right hip: Secondary | ICD-10-CM | POA: Diagnosis not present

## 2017-11-20 ENCOUNTER — Ambulatory Visit: Admit: 2017-11-20 | Discharge: 2017-12-03 | Payer: PRIVATE HEALTH INSURANCE

## 2017-11-20 DIAGNOSIS — C61 Malignant neoplasm of prostate: Secondary | ICD-10-CM | POA: Diagnosis not present

## 2017-11-20 DIAGNOSIS — R599 Enlarged lymph nodes, unspecified: Secondary | ICD-10-CM | POA: Diagnosis not present

## 2017-11-20 DIAGNOSIS — R590 Localized enlarged lymph nodes: Secondary | ICD-10-CM | POA: Diagnosis not present

## 2017-11-20 DIAGNOSIS — R918 Other nonspecific abnormal finding of lung field: Secondary | ICD-10-CM | POA: Diagnosis not present

## 2017-11-20 DIAGNOSIS — R937 Abnormal findings on diagnostic imaging of other parts of musculoskeletal system: Secondary | ICD-10-CM | POA: Diagnosis not present

## 2017-11-26 MED ORDER — AMITRIPTYLINE 25 MG TABLET
ORAL_TABLET | 3 refills | 0 days | Status: CP
Start: 2017-11-26 — End: 2018-12-16

## 2017-12-03 ENCOUNTER — Ambulatory Visit
Admit: 2017-12-03 | Discharge: 2017-12-04 | Payer: PRIVATE HEALTH INSURANCE | Attending: Nurse Practitioner | Primary: Nurse Practitioner

## 2017-12-03 ENCOUNTER — Other Ambulatory Visit: Admit: 2017-12-03 | Discharge: 2017-12-04 | Payer: PRIVATE HEALTH INSURANCE

## 2017-12-03 DIAGNOSIS — C61 Malignant neoplasm of prostate: Secondary | ICD-10-CM | POA: Diagnosis not present

## 2017-12-03 DIAGNOSIS — M25552 Pain in left hip: Secondary | ICD-10-CM | POA: Diagnosis not present

## 2017-12-03 DIAGNOSIS — M25551 Pain in right hip: Secondary | ICD-10-CM | POA: Diagnosis not present

## 2017-12-03 DIAGNOSIS — Z923 Personal history of irradiation: Secondary | ICD-10-CM | POA: Diagnosis not present

## 2017-12-03 DIAGNOSIS — Z79899 Other long term (current) drug therapy: Secondary | ICD-10-CM | POA: Diagnosis not present

## 2017-12-03 DIAGNOSIS — M549 Dorsalgia, unspecified: Secondary | ICD-10-CM | POA: Diagnosis not present

## 2017-12-03 DIAGNOSIS — C7800 Secondary malignant neoplasm of unspecified lung: Principal | ICD-10-CM

## 2017-12-03 MED ORDER — ENZALUTAMIDE 40 MG CAPSULE: 80 mg | capsule | Freq: Every day | 6 refills | 0 days | Status: AC

## 2017-12-03 MED ORDER — ENZALUTAMIDE 40 MG CAPSULE
ORAL_CAPSULE | Freq: Every day | ORAL | 6 refills | 0.00000 days | Status: CP
Start: 2017-12-03 — End: 2017-12-03

## 2017-12-04 NOTE — Unmapped (Signed)
Per test claim for [facility] at the [facility], patient needs Medication Assistance Program for Prior Authorization.

## 2017-12-07 MED ORDER — VENLAFAXINE ER 75 MG CAPSULE,EXTENDED RELEASE 24 HR
ORAL_CAPSULE | Freq: Every day | ORAL | 3 refills | 0 days | Status: CP
Start: 2017-12-07 — End: 2018-08-27

## 2017-12-07 MED ORDER — PEN NEEDLE, DIABETIC 32 GAUGE X 5/32" (4 MM)
3 refills | 0 days | Status: CP
Start: 2017-12-07 — End: 2018-05-02

## 2017-12-07 MED ORDER — INSULIN GLARGINE (U-100) 100 UNIT/ML (3 ML) SUBCUTANEOUS PEN
Freq: Every day | SUBCUTANEOUS | 3 refills | 0.00000 days | Status: CP
Start: 2017-12-07 — End: 2018-01-09

## 2017-12-07 MED ORDER — HYDROCHLOROTHIAZIDE 12.5 MG TABLET
ORAL_TABLET | Freq: Every day | ORAL | 3 refills | 0.00000 days | Status: CP
Start: 2017-12-07 — End: 2018-04-13

## 2017-12-14 MED FILL — XTANDI/40MG/CAPS: XTANDI/40MG/CAPS | 30 days supply | Qty: 60 | Fill #0

## 2017-12-14 NOTE — Unmapped (Signed)
[facility] Onboarding    Specialty Medication(s): Michael Stokes   Diagnosis: mCRPC  Refills: 30 day    Michael Stokes, DOB: 05-Feb-1935  Phone: [phone] (home)   Confirmed Shipping address: [address]  [place] [address]  All above HIPAA information was verified with patient.    Next Scheduled Delivery Date: 12/16/17 from [facility]     Financial/Shipment   Primary Billing: [employer]  Secondary Billing [place], [place], CoPay Card, Secondary Commercial): PANF $7,500  Anticipated copay of $0 reviewed with patient (See full details under Referrals tab in EPIC).    Verified delivery address in FSI and reviewed medication storage requirement.    The following was explained to the patient:  Advised patient of the following:  -A Welcome packet will be sent to the patient   -Assignment of Benefit for the patient to review and return before next refill  -Arrangement of payment method can be done by contacting the pharmacy  -Take medications with during travel, have doctor's appointments, or if being admitted to the hospital.    Advised patient of refill order process:  Specialty pharmacy process for medication shipment was also reviewed.  Discussed the service provided by [facility] with respects to monthly outbound calls to the patient to set up refill deliveries 7-10 days prior to their subsequent needed refill.  Emphasized need for patient to be reachable in order to schedule medication shipment and that shipment will be shipped to the deliverable address provided via UPS.  Informed patient that welcome packet will be sent.        Provided the [facility] pharmacy contact information below:  [facility] [phone], option 4)    Medication Education   Patient was counseled on this medication previously please see prior note.     Patient verbalized understanding of the above information.  Answered all patient questions.  [facility] team will follow up with patient monthly for standard refill processing and delivery.      Laverna Peace PharmD, BCOP, CPP  Hematology/Oncology Pharmacist  P: [phone]

## 2017-12-14 NOTE — Unmapped (Unsigned)
XTANDI APPROVED FOR $0 WITH GRANT

## 2017-12-29 MED ORDER — ALLOPURINOL 100 MG TABLET
ORAL_TABLET | Freq: Every day | ORAL | 1 refills | 0 days | Status: CP
Start: 2017-12-29 — End: 2018-06-23

## 2018-01-05 NOTE — Unmapped (Signed)
The Internal Medicine Enhanced Care team was unable to reach Michael Stokes to follow-up with the patient in regards to the Chronic Care Management program. We have made one attempts to contact patient. A voicemail was left with a request to return our call.     Planned to discuss:  -DM:  Lantus 56u nightly  Novolog 6 u ac  -Eye exam

## 2018-01-06 NOTE — Unmapped (Signed)
[facility] Refill Coordination Note  Specialty Medication(s): Diana Eves  Additional Medications shipped: na    [name], DOB: [birth year]  Phone: [phone] (home) , Alternate phone contact: N/A  Phone or address changes today?: No  All above HIPAA information was verified with patient.  Shipping Address: [address]  [place] [address]   Insurance changes? No    Completed refill call assessment today to schedule patient's medication shipment from the [facility] [phone]).      Confirmed the medication and dosage are correct and have not changed: Yes, regimen is correct and unchanged.    Confirmed patient started or stopped the following medications in the past month:  No, there are no changes reported at this time.    Are you tolerating your medication?:  Zollie Beckers reports tolerating the medication. Some hot flashes but not too big of an issue.    ADHERENCE    Did you miss any doses in the past 4 weeks? No missed doses reported.    FINANCIAL/SHIPPING    Delivery Scheduled: Yes, Expected medication delivery date: Friday, March 1     The patient will receive an FSI print out for each medication shipped and additional FDA Medication Guides as required.  Patient education from [place] or Robet Leu may also be included in the shipment    Zollie Beckers did not have any additional questions at this time.    Delivery address validated in FSI scheduling system: Yes, address listed in FSI is correct.    We will follow up with patient monthly for standard refill processing and delivery.      Thank you,  Tawanna Solo [facility] Specialty Pharmacist

## 2018-01-07 ENCOUNTER — Ambulatory Visit: Admit: 2018-01-07 | Discharge: 2018-01-08 | Payer: PRIVATE HEALTH INSURANCE

## 2018-01-07 DIAGNOSIS — I10 Essential (primary) hypertension: Secondary | ICD-10-CM | POA: Diagnosis not present

## 2018-01-07 DIAGNOSIS — E119 Type 2 diabetes mellitus without complications: Secondary | ICD-10-CM | POA: Diagnosis not present

## 2018-01-07 DIAGNOSIS — C61 Malignant neoplasm of prostate: Secondary | ICD-10-CM | POA: Diagnosis not present

## 2018-01-07 MED ORDER — INSULIN ASPART (U-100) 100 UNIT/ML (3 ML) SUBCUTANEOUS PEN
prn refills | 0 days | Status: CP
Start: 2018-01-07 — End: 2018-01-25

## 2018-01-07 MED FILL — XTANDI/40MG/CAPS: XTANDI/40MG/CAPS | 30 days supply | Qty: 60 | Fill #1

## 2018-01-07 NOTE — Unmapped (Signed)
1. Continue Lantus 56 units nightly  2. Increase meal time insulin to 8 units prior to all meals

## 2018-01-09 NOTE — Unmapped (Signed)
Assessment/Plan:     Lab Results   Component Value Date    A1C 9.0 (H) 01/07/2018    A1C 8.2 (H) 10/09/2017    A1C 9.2 (H) 07/08/2017     1. DM-  Continue Lantus 56 units nightly. FBG range between 89- to 110, therefore long acting insulin regimen effective. Increase meal time insulin from 6 to 8 units prior to all meals for postprandial coverage to achieve A1C goal.  Continue ASA, statin, ARB.  Urine micro unremarkable in 8/18, needs foot exam and ophth referral addressed next visit.   2. Psych - Patient under high stress as prostate cancer has relapsed. No acute distress noted.  3. htn - borderline, continue regimen for now.      F/u: 04/06/2018 w/ a1c    Subject/Objective:     Chief Complaint   Patient presents with   ??? Diabetes     S: 83 y.o. year old male with PMHx significant for   Past Medical History:   Diagnosis Date   ??? Cancer with pulmonary metastases (CMS-HCC)    ??? Chronic kidney disease    ??? Depression    ??? Diabetes Mellitus, uncontrolled 06/06/2009   ??? GERD (gastroesophageal reflux disease)    ??? Hypertension    ??? Hypertension 06/06/2009   ??? Peripheral neuropathy    .     Patient Active Problem List   Diagnosis   ??? Diabetes   ??? Hypertension   ??? Malignant neoplasm of prostate (CMS-HCC)   ??? Polyneuropathy in diabetes (CMS-HCC)   ??? Hypercholesterolemia   ??? Cancer with pulmonary metastases (CMS-HCC)   ??? Dupuytren's contracture of both hands   ??? Rotator cuff tear   ??? Kyphosis, acquired   ??? Durable power of attorney for healthcare exists but copy not available   ??? CKD stage 3 due to type 2 diabetes mellitus (CMS-HCC)   ??? Gout   ??? Enrolled in chronic care management     FBG mostly in 90-120 range, pre-prandials mostly in 140-250 range.         Your Medication List          Accurate as of 01/07/18 11:59 PM. If you have any questions, ask your nurse or doctor.               CHANGE how you take these medications    insulin ASPART 100 unit/mL injection pen  Commonly known as:  NovoLOG FLEXPEN  8u TID AC  What changed: additional instructions  Changed by:  Dwana Melena, PA        CONTINUE taking these medications    allopurinol 100 MG tablet  Commonly known as:  ZYLOPRIM  Take 2 tablets (200 mg total) by mouth daily.     amitriptyline 25 MG tablet  Commonly known as:  ELAVIL  TAKE 1 TABLET(25 MG) BY MOUTH EVERY NIGHT     aspirin 81 MG tablet  Commonly known as:  ENTERIC COATED ASPIRIN  Take 1 tablet (81 mg total) by mouth daily.     atorvastatin 40 MG tablet  Commonly known as:  LIPITOR  Take 1 tablet (40 mg total) by mouth daily.     blood sugar diagnostic Strp  Commonly known as:  FREESTYLE LITE STRIPS  by Other route Two (2) times a day.     carvedilol 25 MG tablet  Commonly known as:  COREG  Take 1 tablet (25 mg total) by mouth Two (2) times a day.  cholecalciferol (vitamin D3) 1,000 unit tablet  2000u daily     enzalutamide 40 mg Cap capsule  Commonly known as:  XTANDI  Take 2 capsules (80 mg total) by mouth daily.     hydroCHLOROthiazide 12.5 MG tablet  Commonly known as:  HYDRODIURIL  Take 2 tablets (25 mg total) by mouth daily.     insulin glargine 100 unit/mL (3 mL) injection pen  Commonly known as:  LANTUS SOLOSTAR U-100 INSULIN  Inject 0.56 mL (56 Units total) under the skin daily.     losartan 100 MG tablet  Commonly known as:  COZAAR  Take 1 tablet (100 mg total) by mouth daily.     pen needle, diabetic 32 gauge x 5/32 Ndle  Commonly known as:  BD ULTRA-FINE NANO PEN NEEDLE  BID as directed w/ insulin     venlafaxine 75 MG 24 hr capsule  Commonly known as:  EFFEXOR-XR  Take 1 capsule (75 mg total) by mouth daily.            Allergies   Allergen Reactions   ??? Amlodipine Swelling   ??? Topiramate Swelling   ??? Arb-Angiotensin Receptor Antagonist Other (See Comments)     Hyperkalemia   ??? Zolpidem Other (See Comments)     Hangover        ROS: negative for vision changes, CP, abdominal pain, bowel/bladder changes, bleeding, lower extremity edema    O:  Vital Signs:    Vitals:    01/07/18 1002 01/07/18 1106   BP: 185/82 140/75   Pulse: 78    Weight: 96.7 kg (213 lb 3.2 oz)    Height: 172.7 cm (5' 7.99)        Physical Exam  General Appearance:    Alert, cooperative, no distress, appears stated age   Head:    Normocephalic, without obvious abnormality, atraumatic   Eyes:     Ears:     Throat:   Lips, mucosa, and tongue normal   Neck:   Supple, symmetrical, trachea midline,      No JVD bilaterally.   Lungs:     Clear to auscultation bilaterally, respirations unlabored   Chest Wall:    No tenderness or deformity    Heart:    Regular rate and rhythm, S1 and S2 normal, no murmur, rub    or gallop   Abdomen:     Soft, non-tender       Extremities:   Extremities normal, atraumatic, no cyanosis or edema   Psych :  No agitation, anxiety, or depressed mood.

## 2018-01-11 MED ORDER — BLOOD SUGAR DIAGNOSTIC STRIPS
ORAL_STRIP | Freq: Two times a day (BID) | 11 refills | 0 days | Status: CP
Start: 2018-01-11 — End: 2018-12-16

## 2018-01-11 MED ORDER — LOSARTAN 100 MG TABLET
ORAL_TABLET | Freq: Every day | ORAL | 3 refills | 0.00000 days | Status: CP
Start: 2018-01-11 — End: 2019-01-12

## 2018-01-11 MED ORDER — INSULIN GLARGINE (U-100) 100 UNIT/ML (3 ML) SUBCUTANEOUS PEN
Freq: Every day | SUBCUTANEOUS | 3 refills | 0 days | Status: CP
Start: 2018-01-11 — End: 2018-02-04

## 2018-01-12 NOTE — Unmapped (Signed)
I was the supervising provider for the service delivered.    Jillene Bucks

## 2018-01-14 ENCOUNTER — Ambulatory Visit
Admit: 2018-01-14 | Discharge: 2018-01-15 | Payer: PRIVATE HEALTH INSURANCE | Attending: Nurse Practitioner | Primary: Nurse Practitioner

## 2018-01-14 ENCOUNTER — Other Ambulatory Visit: Admit: 2018-01-14 | Discharge: 2018-01-15 | Payer: PRIVATE HEALTH INSURANCE

## 2018-01-14 DIAGNOSIS — Z8546 Personal history of malignant neoplasm of prostate: Secondary | ICD-10-CM | POA: Diagnosis not present

## 2018-01-14 DIAGNOSIS — R918 Other nonspecific abnormal finding of lung field: Secondary | ICD-10-CM | POA: Diagnosis not present

## 2018-01-14 DIAGNOSIS — Z08 Encounter for follow-up examination after completed treatment for malignant neoplasm: Secondary | ICD-10-CM | POA: Diagnosis not present

## 2018-01-14 DIAGNOSIS — M25552 Pain in left hip: Secondary | ICD-10-CM | POA: Diagnosis not present

## 2018-01-14 DIAGNOSIS — M25551 Pain in right hip: Secondary | ICD-10-CM | POA: Diagnosis not present

## 2018-01-14 DIAGNOSIS — R911 Solitary pulmonary nodule: Secondary | ICD-10-CM | POA: Diagnosis not present

## 2018-01-14 DIAGNOSIS — Z923 Personal history of irradiation: Secondary | ICD-10-CM | POA: Diagnosis not present

## 2018-01-14 DIAGNOSIS — I10 Essential (primary) hypertension: Secondary | ICD-10-CM | POA: Diagnosis not present

## 2018-01-14 DIAGNOSIS — G8929 Other chronic pain: Secondary | ICD-10-CM | POA: Diagnosis not present

## 2018-01-14 DIAGNOSIS — R590 Localized enlarged lymph nodes: Secondary | ICD-10-CM | POA: Diagnosis not present

## 2018-01-14 DIAGNOSIS — M549 Dorsalgia, unspecified: Secondary | ICD-10-CM | POA: Diagnosis not present

## 2018-01-14 DIAGNOSIS — C61 Malignant neoplasm of prostate: Principal | ICD-10-CM

## 2018-01-14 LAB — CBC W/ AUTO DIFF
BASOPHILS ABSOLUTE COUNT: 0.1 10*9/L (ref 0.0–0.1)
BASOPHILS RELATIVE PERCENT: 1 %
EOSINOPHILS ABSOLUTE COUNT: 0.2 10*9/L (ref 0.0–0.4)
EOSINOPHILS RELATIVE PERCENT: 3.5 %
HEMOGLOBIN: 12.2 g/dL — ABNORMAL LOW (ref 13.5–17.5)
LYMPHOCYTES ABSOLUTE COUNT: 1.2 10*9/L — ABNORMAL LOW (ref 1.5–5.0)
LYMPHOCYTES RELATIVE PERCENT: 21.4 %
MEAN CORPUSCULAR HEMOGLOBIN CONC: 33.5 g/dL (ref 31.0–37.0)
MEAN CORPUSCULAR HEMOGLOBIN: 30.7 pg (ref 26.0–34.0)
MEAN CORPUSCULAR VOLUME: 91.5 fL (ref 80.0–100.0)
MEAN PLATELET VOLUME: 7.9 fL (ref 7.0–10.0)
MONOCYTES ABSOLUTE COUNT: 0.3 10*9/L (ref 0.2–0.8)
MONOCYTES RELATIVE PERCENT: 4.9 %
NEUTROPHILS ABSOLUTE COUNT: 3.8 10*9/L (ref 2.0–7.5)
NEUTROPHILS RELATIVE PERCENT: 66.4 %
PLATELET COUNT: 202 10*9/L (ref 150–440)
RED BLOOD CELL COUNT: 3.99 10*12/L — ABNORMAL LOW (ref 4.50–5.90)
RED CELL DISTRIBUTION WIDTH: 14.7 % (ref 12.0–15.0)
WBC ADJUSTED: 5.8 10*9/L (ref 4.5–11.0)

## 2018-01-14 LAB — LYMPHOCYTES ABSOLUTE COUNT: Lab: 1.2 — ABNORMAL LOW

## 2018-01-14 LAB — COMPREHENSIVE METABOLIC PANEL
ALBUMIN: 3.8 g/dL (ref 3.5–5.0)
ALKALINE PHOSPHATASE: 110 U/L (ref 38–126)
ALT (SGPT): 28 U/L (ref 19–72)
ANION GAP: 6 mmol/L — ABNORMAL LOW (ref 9–15)
AST (SGOT): 27 U/L (ref 19–55)
BILIRUBIN TOTAL: 0.9 mg/dL (ref 0.0–1.2)
BUN / CREAT RATIO: 13
CALCIUM: 9.4 mg/dL (ref 8.5–10.2)
CHLORIDE: 101 mmol/L (ref 98–107)
CO2: 31 mmol/L — ABNORMAL HIGH (ref 22.0–30.0)
CREATININE: 1.51 mg/dL — ABNORMAL HIGH (ref 0.70–1.30)
EGFR MDRD NON AF AMER: 45 mL/min/{1.73_m2} — ABNORMAL LOW (ref >=60)
GLUCOSE RANDOM: 238 mg/dL — ABNORMAL HIGH (ref 65–179)
POTASSIUM: 4.7 mmol/L (ref 3.5–5.0)
PROTEIN TOTAL: 5.8 g/dL — ABNORMAL LOW (ref 6.5–8.3)
SODIUM: 138 mmol/L (ref 135–145)

## 2018-01-14 LAB — CALCIUM: Calcium:MCnc:Pt:Ser/Plas:Qn:: 9.4

## 2018-01-14 LAB — PROSTATE SPECIFIC ANTIGEN: Prostate specific Ag:MCnc:Pt:Ser/Plas:Qn:: 1.69

## 2018-01-14 NOTE — Unmapped (Signed)
BP recheck required

## 2018-01-14 NOTE — Unmapped (Signed)
Labs drawn and sent for analysis.  Care provided by  D Maxwell, RN

## 2018-01-14 NOTE — Unmapped (Signed)
CC: f/u prostate cancer    A&P:    1.  mCRPC:    ?? Lupron 22.5 mg 11/16/17, due again on or after 02/14/18  ?? Rec daily calcium and vitamin D  ?? Continue good self care  ?? HOLD enzalutamide in the setting of HTN. Increase carvedilol to 37.5 mg BID (from 25 mg BID).   ?? RTC 1 week to see how his blood pressure is. If better controlled, can restart enza. He does not need labs at that visit.    Great PSA response        Approximately 25 minutes was spent in the care of this patient, half in education and counseling.  A copy of the AVS was provided to the patient which includes key points from our visit as well as our contact information.      Albertina Parr, NP-C  Adult Nurse Practitioner  Urology & Medical Oncology      Onc history:    --Status post a radical prostatectomy in 2001 in [place].   --He had salvage radiotherapy in 2002 and had  been followed with a gradually rising PSA. He was found to have multiple pulmonary nodules, but no bone metastases.   --Lupron was started in early 2009 with dramatic reduction in his pulmonary disease.    --His PSA has been rising on ADT, so Casodex (2010, PSA 9.6) was started and subsequently Zytiga (04/2011, for sx C7 lesion without PSA rise).  --XRT C4-T2 2012 before starting Zytiga  --He discontinued Zytiga because he had substantial weight gain on Zytiga and Prednisone.   --He also discontinued Xtandi (started in 05/2012) because of fatigue and rash, joint swelling.    In looking back through his notes it looks like his Lupron was stoppped when he started casodex in 2010    Despite the side effects, he had a sustained biochemical response. He fractured a vertebrae falling off of a ladder and now has chronic back pain.    --Restarted primary ADT with Lupron 22.5 mg in 03/2016  --PSA rise; Scans 11/2017 with increased size of 1 pulm nodule and LAN  --Started Xtandi end of 11/2017 at a reduced dose of 80 mg in an effort to minimize SE        HISTORY OF PRESENT ILLNESS:     Michael Stokes returns today for scheduled followup.    He denies any ADT related side effects. Rare hot flash, but nothing bothersome, stable    He is doing really well with the enza. Slight increase in hot flashes, otherwise nothing bothersome  They recently got a puppy    He denies LUTS, hematuria.   He does note some bilateral hip pain that is worse when he walks, chronic      Allergies   Allergen Reactions   ??? Amlodipine Swelling   ??? Topiramate Swelling   ??? Arb-Angiotensin Receptor Antagonist Other (See Comments)     Hyperkalemia   ??? Zolpidem Other (See Comments)     Hangover     Meds: Updated in EPIC    10 pt ROS reviewed and negative except per HPI           VITAL SIGNS:          Vitals:    01/14/18 1227   BP: 170/82   Pulse:    Resp:    Temp:    SpO2:      GENERAL: Pleasant, NAD.   VS: Reviewed and unremarkable  Head: normocephalic  Eyes: sclera aniecteric  Pulm: Non labored WOB; CTA bil  CV: RRR; no MRG  Skin: Warm, dry  Msk: No Le edema  NEURO: A&O x 3          Data:    CT CAP: 5/17  IMPRESSION:   1. New retroperitoneal and pelvic adenopathy concerning for metastatic disease.  2. Pulmonary nodules, at least one of which is slightly increased in size, and a few of which are new compared to prior, concerning for metastatic disease.    Bone scan: 5/17  IMPRESSION:   -Grossly unchanged focal uptake in the right aspect of C7. No new osseous lesion identified.    CT CAP 11/2017    Impression     - Increased size of right lower lobe pulmonary nodule.    - Enlarged aortocaval lymph node now measuring 1.7 cm. Decreased size of previously seen retrocaval and external iliac lymph nodes.       Bone scan 11/2017    Impression     Increased focal uptake at C7 compared to most recent bone scan (03/20/2016) is concerning for recurrent disease, though is indeterminate given known history of metastatic lesion at this site.         Lab Results   Component Value Date    PSA 1.69 01/14/2018    PSA 18.00 (H) 12/03/2017    PSA 15.90 (H) 11/16/2017    PSA 7.35 (H) 08/03/2017    PSA 6.99 (H) 04/02/2017    PSA 12.70 (H) 12/25/2016     03/27/2015 = 3.9     PSA Diagnostic   Date Value Ref Range Status   12/25/2014 3.9 0.0 - 4.0 ng/mL Final   10/20/2014 1.7 0.0 - 4.0 ng/mL Final   09/18/2014 2.1 0.0 - 4.0 ng/mL Final   06/12/2014 1.3 0.0 - 4.0 ng/mL Final   03/09/2014 0.5 0.0 - 4.0 ng/mL Final   12/08/2013 0.2 0.0 - 4.0 ng/mL Final   09/08/2013 <0.1 0.0 - 4.0 ng/mL Final   06/09/2013 <0.1 0.0 - 4.0 ng/mL Final   03/03/2013 <0.1 0.0 - 4.0 ng/mL Final   12/02/2012 <0.1 0.0 - 4.0 NG/ML Final   08/26/2012 <0.1 0.0 - 4.0 NG/ML Final   05/28/2012 <0.1 0.0 - 4.0 NG/ML Final   02/26/2012 <0.1 0.0 - 4.0 NG/ML Final   12/04/2011 <0.1 0.0 - 4.0 NG/ML Final   07/24/2011 1.4 0.0 - 4.0 NG/ML Final   04/17/2011 3.0 0.0 - 4.0 NG/ML Final   01/09/2011 3.1 0.0 - 4.0 NG/ML Final     2010-10-10 PSA, DIAGNOSTIC 2.3 NG/ML 0.0-4.0    2010-06-20 PSA, DIAGNOSTIC 1.2 NG/ML 0.0-4.0    2010-01-24 PSA, DIAGNOSTIC 1.1 NG/ML 0.0-4.0           His PSA increased previously to 9.6 . In 04/26/09 it was 3.7, 01/18/09 it was 1.2.

## 2018-01-14 NOTE — Unmapped (Addendum)
??   Stop the Xtandi until I see you again  ?? Come back in 1 week to see how you're doing with the new blood pressure management  ?? Increase Carvedilol to 37.5 mg two times/day. You can break a 25 mg tablet to give you an extra 12.5. Call me if you prefer a new prescription for 12.5 mg instead of cutting the pills  ?? No labs next week. Just blood pressure and a visit    Albertina Parr, NP-C, OCN  Adult Nurse Practitioner  Urology and Medical Oncology    If you have been prescribed a medication today, always read the package insert that comes with the medication.    In the event of an emergency, always call 911    Urology:  Main clinic & [facility]: [phone]  Fax: [phone]    Cancer Hospital:  Phone: [phone]  Fax: [phone]    After hours/nights/weekends:  Hospital Operator: [phone]    [email or web address]  [email or web address]

## 2018-01-18 DIAGNOSIS — E119 Type 2 diabetes mellitus without complications: Secondary | ICD-10-CM | POA: Diagnosis not present

## 2018-01-18 NOTE — Unmapped (Signed)
The Internal Medicine Enhanced Care team was unable to reach Michael Stokes to follow-up with the patient in regards to the Chronic Care Management program. We have made one attempts to contact patient. A voicemail was left with a request to return our call.     Planned to discuss:    Review last clinic note/s with patient.   1. DM-  Continue Lantus 56 units nightly. FBG range between 89- to 110, therefore long acting insulin regimen effective. Increase meal time insulin from 6 to 8 units prior to all meals for postprandial coverage to achieve A1C goal.  Continue ASA, statin, ARB.  Urine micro unremarkable in 8/18, needs foot exam and ophth referral addressed next visit.   2. Psych - Patient under high stress as prostate cancer has relapsed. No acute distress noted.  3. htn - borderline, continue regimen for now.

## 2018-01-21 ENCOUNTER — Ambulatory Visit
Admit: 2018-01-21 | Discharge: 2018-01-22 | Payer: PRIVATE HEALTH INSURANCE | Attending: Nurse Practitioner | Primary: Nurse Practitioner

## 2018-01-21 DIAGNOSIS — C61 Malignant neoplasm of prostate: Secondary | ICD-10-CM | POA: Diagnosis not present

## 2018-01-21 MED ORDER — CARVEDILOL 25 MG TABLET
ORAL_TABLET | Freq: Two times a day (BID) | ORAL | 3 refills | 0.00000 days | Status: CP
Start: 2018-01-21 — End: 2018-04-19

## 2018-01-21 NOTE — Unmapped (Addendum)
??   Keep taking carvedilol 37.5 mg two times/day  ?? I will send this to your pharmacy  ?? Check your blood pressire every day  ?? If the top number is greater than 155 and/or the bottom number is greater than 90, please call  ?? Restart the [place] 2 pills/day      Albertina Parr, NP-C, OCN  Adult Nurse Practitioner  Urology and Medical Oncology    If you have been prescribed a medication today, always read the package insert that comes with the medication.    In the event of an emergency, always call 911    Urology:  Main clinic & [facility]: [phone]  Fax: [phone]    Cancer Hospital:  Phone: [phone]  Fax: [phone]    After hours/nights/weekends:  Hospital Operator: [phone]    [email or web address]  [email or web address]

## 2018-01-21 NOTE — Unmapped (Signed)
CC: f/u prostate cancer    A&P:    1.  mCRPC:    ?? Lupron 22.5 mg 11/16/17, due again on or after 02/14/18  ?? Rec daily calcium and vitamin D  ?? Continue good self care  ?? Restart enzalutamide 80 mg daily  ?? Continue carvedilol to 37.5 mg BID. Check BP daily and notify us of abnormal readings (parameters given)     RTC as scheduled in early April for labs, visit, Lupron    Approximately 25 minutes was spent in the care of this patient, half in education and counseling.  A copy of the AVS was provided to the patient which includes key points from our visit as well as our contact information.      Albertina Parr, NP-C  Adult Nurse Practitioner  Urology & Medical Oncology      Onc history:    --Status post a radical prostatectomy in 2001 in [place].   --He had salvage radiotherapy in 2002 and had  been followed with a gradually rising PSA. He was found to have multiple pulmonary nodules, but no bone metastases.   --Lupron was started in early 2009 with dramatic reduction in his pulmonary disease.    --His PSA has been rising on ADT, so Casodex (2010, PSA 9.6) was started and subsequently Zytiga (04/2011, for sx C7 lesion without PSA rise).  --XRT C4-T2 2012 before starting Zytiga  --He discontinued Zytiga because he had substantial weight gain on Zytiga and Prednisone.   --He also discontinued Xtandi (started in 05/2012) because of fatigue and rash, joint swelling.    In looking back through his notes it looks like his Lupron was stoppped when he started casodex in 2010    Despite the side effects, he had a sustained biochemical response. He fractured a vertebrae falling off of a ladder and now has chronic back pain.    --Restarted primary ADT with Lupron 22.5 mg in 03/2016  --PSA rise; Scans 11/2017 with increased size of 1 pulm nodule and LAN  --Started Xtandi end of 11/2017 at a reduced dose of 80 mg in an effort to minimize SE        HISTORY OF PRESENT ILLNESS:     Michael Stokes returns today for scheduled followup.    He has been off the enza for one week due to HTN. He increased his carvedilol as directed.     Manual BP 140/70 today    He has a cuff at home      Allergies   Allergen Reactions   ??? Amlodipine Swelling   ??? Topiramate Swelling   ??? Arb-Angiotensin Receptor Antagonist Other (See Comments)     Hyperkalemia   ??? Zolpidem Other (See Comments)     Hangover     Meds: Updated in EPIC    10 pt ROS reviewed and negative except per HPI           VITAL SIGNS:          Vitals:    01/21/18 1403   BP: 140/74   Pulse:    Resp:    Temp:    SpO2:      GENERAL: Pleasant, NAD.   VS: Reviewed and unremarkable  Head: normocephalic  Eyes: sclera aniecteric  Pulm: Non labored WOB; CTA bil  CV: RRR; no MRG  Skin: Warm, dry  Msk: No Le edema  NEURO: A&O x 3          Data:  CT CAP: 5/17  IMPRESSION:   1. New retroperitoneal and pelvic adenopathy concerning for metastatic disease.  2. Pulmonary nodules, at least one of which is slightly increased in size, and a few of which are new compared to prior, concerning for metastatic disease.    Bone scan: 5/17  IMPRESSION:   -Grossly unchanged focal uptake in the right aspect of C7. No new osseous lesion identified.    CT CAP 11/2017    Impression     - Increased size of right lower lobe pulmonary nodule.    - Enlarged aortocaval lymph node now measuring 1.7 cm. Decreased size of previously seen retrocaval and external iliac lymph nodes.       Bone scan 11/2017    Impression     Increased focal uptake at C7 compared to most recent bone scan (03/20/2016) is concerning for recurrent disease, though is indeterminate given known history of metastatic lesion at this site.         Lab Results   Component Value Date    PSA 1.69 01/14/2018    PSA 18.00 (H) 12/03/2017    PSA 15.90 (H) 11/16/2017    PSA 7.35 (H) 08/03/2017    PSA 6.99 (H) 04/02/2017    PSA 12.70 (H) 12/25/2016     03/27/2015 = 3.9     PSA Diagnostic   Date Value Ref Range Status   12/25/2014 3.9 0.0 - 4.0 ng/mL Final   10/20/2014 1.7 0.0 - 4.0 ng/mL Final   09/18/2014 2.1 0.0 - 4.0 ng/mL Final   06/12/2014 1.3 0.0 - 4.0 ng/mL Final   03/09/2014 0.5 0.0 - 4.0 ng/mL Final   12/08/2013 0.2 0.0 - 4.0 ng/mL Final   09/08/2013 <0.1 0.0 - 4.0 ng/mL Final   06/09/2013 <0.1 0.0 - 4.0 ng/mL Final   03/03/2013 <0.1 0.0 - 4.0 ng/mL Final   12/02/2012 <0.1 0.0 - 4.0 NG/ML Final   08/26/2012 <0.1 0.0 - 4.0 NG/ML Final   05/28/2012 <0.1 0.0 - 4.0 NG/ML Final   02/26/2012 <0.1 0.0 - 4.0 NG/ML Final   12/04/2011 <0.1 0.0 - 4.0 NG/ML Final   07/24/2011 1.4 0.0 - 4.0 NG/ML Final   04/17/2011 3.0 0.0 - 4.0 NG/ML Final   01/09/2011 3.1 0.0 - 4.0 NG/ML Final     2010-10-10 PSA, DIAGNOSTIC 2.3 NG/ML 0.0-4.0    2010-06-20 PSA, DIAGNOSTIC 1.2 NG/ML 0.0-4.0    2010-01-24 PSA, DIAGNOSTIC 1.1 NG/ML 0.0-4.0           His PSA increased previously to 9.6 . In 04/26/09 it was 3.7, 01/18/09 it was 1.2.

## 2018-01-25 MED ORDER — INSULIN ASPART (U-100) 100 UNIT/ML (3 ML) SUBCUTANEOUS PEN
3 refills | 0 days | Status: CP
Start: 2018-01-25 — End: 2018-04-06

## 2018-01-25 MED ORDER — ATORVASTATIN 40 MG TABLET
ORAL_TABLET | Freq: Every day | ORAL | 3 refills | 0.00000 days | Status: CP
Start: 2018-01-25 — End: 2018-07-28

## 2018-01-29 NOTE — Unmapped (Signed)
Chronic Care Management Note    Michael Stokes is a 82 y.o. male    PCP Action Items: none    Care Manager Action Item: none    Review last clinic note/s with patient.   1. DM- ??Continue Lantus 56 units nightly. FBG??range between 89- to 110, therefore long acting insulin regimen effective. Increase meal time insulin from 6 to 8 units prior to all meals for postprandial coverage to achieve A1C goal. ??Continue ASA, statin, ARB. ??Urine micro unremarkable in 8/18, needs foot exam and ophth referral addressed next visit.   2. Psych - Patient under high stress as prostate cancer has relapsed. No acute distress noted.  3. htn - borderline, continue regimen for now. ??    Diabetes:    Lab Results   Component Value Date    A1C 9.0 (H) 01/07/2018         The Diabetes Enhanced Care Team spoke to [facility] to follow up on CCM.    Pt reports a diabetes regimen of:  Lantus 56 u  Novolog 8 units     Missed doses? no  Hypos? no    Pt reports blood sugars of:  Date Fasting 515 PM 9 PM   3/1 89     3/2 142     3/5   201   3/6 115     3/8 128     3/9  84    3/11  114    3/15  101    3/17 118     3/19  118    3/20 123         Recommended Dietitian Visit? no    Exercise: working out in the yard     Follow Up:  Any recent visits to outside providers? (Urgent Care, ER, Hospital)? no  Need any refills?  no    Remind patient of our After Hours #, Urgent Care and SDC.    Need any medical equipment?  no    Is the patient due for Healthcare Maintenance?  yes  --re-enforced need for retinal eye exam    Does the patient need an appointment to be seen in clinic? no  Any outstanding orders that need to be addressed? no  Does patient have transportation to upcoming appointments? yes  Are there any other barriers this patient faces to receiving adequate medical care? no  Patient plan of care was reviewed. Any changes/updates needed were addressed.      The patient was discussed with Arby Barrette, PA-C and the following changes to his/her medications were made:  -continue current regimen  -encouraged pt to get eyes checked    All medical decisions were made by Arby Barrette, PA-C.

## 2018-01-29 NOTE — Unmapped (Signed)
[facility] Refill Coordination Note  Specialty Medication(s): Michael Stokes 40mg   Additional Medications shipped: n/a    Michael Stokes, DOB: [birth year]  Phone: [phone] (home) , Alternate phone contact: N/A  Phone or address changes today?: No  All above HIPAA information was verified with patient.  Shipping Address: [address]  [place] [address]   Insurance changes? No    Completed refill call assessment today to schedule patient's medication shipment from the [facility] [phone]).      Confirmed the medication and dosage are correct and have not changed: Yes, regimen is correct and unchanged.    Confirmed patient started or stopped the following medications in the past month:  No, there are no changes reported at this time.    Are you tolerating your medication?:  Michael Stokes reports side effects of hot flashes.    ADHERENCE    Is this medicine transplant or covered by Medicare Part B? No.        Did you miss any doses in the past 4 weeks? Yes.  Michael Stokes reports missing 7 days of medication therapy in the last 4 weeks.  Michael Stokes reports stopped for 7 days due to increased blood pressue  as the cause of their non-adherance.    FINANCIAL/SHIPPING    Delivery Scheduled: Yes, Expected medication delivery date: 02/08/18     The patient will receive an FSI print out for each medication shipped and additional FDA Medication Guides as required.  Patient education from [place] or [employer] may also be included in the shipment.    Michael Stokes did not have any additional questions at this time.    Delivery address validated in FSI scheduling system: Yes, address listed in FSI is correct.    We will follow up with patient monthly for standard refill processing and delivery.      Thank you,  Belen Zwahlen Vangie Bicker   [facility] Specialty Pharmacist

## 2018-02-05 MED ORDER — INSULIN GLARGINE (U-100) 100 UNIT/ML (3 ML) SUBCUTANEOUS PEN
Freq: Every day | SUBCUTANEOUS | 3 refills | 0 days | Status: CP
Start: 2018-02-05 — End: 2018-05-02

## 2018-02-05 MED FILL — XTANDI/40MG/CAPS: XTANDI/40MG/CAPS | 30 days supply | Qty: 60 | Fill #2

## 2018-02-12 NOTE — Unmapped (Signed)
-----   Message from Rayna Sexton sent at 02/09/2018 10:23 AM EDT -----  Regarding: RE: htn  Hello,    My name is Carole Binning and I'm a pharmacy student on rotation with Florentina Addison this month.   For the patient, we recommend increasing his HCTZ to 50 mg (per EPIC, his dose is 25mg  daily). Although there is a risk of increasing his uric acid level, he is controlled with allopurinol 200 mg (last gout flare 2016). Additionally the increased dose of HCTZ may help with concerns of hyperkalemia. An additional option would be to add hydralazine 25 mg TID if BP remains uncontrolled.   Aldosterone antagonists (spironolactone) and direct renin inhibitors (aliskiren) are not recommended due to the risk of hyperkalemia. Additionally, alpha 1 antagonists (doxazosin) and alpha 2 agonists (clonidine) are not recommended given the patient's age (risk for orthostatic hypotension).     Please let us know if you'd like further clarification.    Thanks,  Carole Binning     ----- Message -----  From: Chancy Hurter, ANP  Sent: 02/05/2018   1:16 PM  To: [name], CPP  Subject: htn                                              Hi    He has been checking his bp at home and sent me some readings. On enza 80 mg    3-15 146/82  pulse 72.      3-17.  162/82   p.71     3-18.   148/81   p.79    3-27.  174/86  p.91     3-28.   161/81 p,79     On carvedilol 37.5 mg BID  hctz 12.5    Hyperkalemia on ARBs and edema with amlodipine    What are your thoughts re: additional management? i'm seeing him again 4/8    thanks

## 2018-02-15 ENCOUNTER — Other Ambulatory Visit: Admit: 2018-02-15 | Discharge: 2018-02-16 | Payer: PRIVATE HEALTH INSURANCE

## 2018-02-15 ENCOUNTER — Ambulatory Visit
Admit: 2018-02-15 | Discharge: 2018-02-16 | Payer: PRIVATE HEALTH INSURANCE | Attending: Nurse Practitioner | Primary: Nurse Practitioner

## 2018-02-15 DIAGNOSIS — C61 Malignant neoplasm of prostate: Secondary | ICD-10-CM | POA: Diagnosis not present

## 2018-02-15 DIAGNOSIS — Z888 Allergy status to other drugs, medicaments and biological substances status: Secondary | ICD-10-CM | POA: Diagnosis not present

## 2018-02-15 DIAGNOSIS — M549 Dorsalgia, unspecified: Secondary | ICD-10-CM | POA: Diagnosis not present

## 2018-02-15 DIAGNOSIS — R590 Localized enlarged lymph nodes: Secondary | ICD-10-CM | POA: Diagnosis not present

## 2018-02-15 DIAGNOSIS — I10 Essential (primary) hypertension: Secondary | ICD-10-CM | POA: Diagnosis not present

## 2018-02-15 DIAGNOSIS — Z9079 Acquired absence of other genital organ(s): Secondary | ICD-10-CM | POA: Diagnosis not present

## 2018-02-15 DIAGNOSIS — Z923 Personal history of irradiation: Secondary | ICD-10-CM | POA: Diagnosis not present

## 2018-02-15 DIAGNOSIS — R918 Other nonspecific abnormal finding of lung field: Secondary | ICD-10-CM | POA: Diagnosis not present

## 2018-02-15 DIAGNOSIS — G8929 Other chronic pain: Secondary | ICD-10-CM | POA: Diagnosis not present

## 2018-02-15 DIAGNOSIS — Z5112 Encounter for antineoplastic immunotherapy: Secondary | ICD-10-CM | POA: Diagnosis not present

## 2018-02-15 DIAGNOSIS — Z79899 Other long term (current) drug therapy: Secondary | ICD-10-CM | POA: Diagnosis not present

## 2018-02-15 LAB — COMPREHENSIVE METABOLIC PANEL
ALBUMIN: 3.7 g/dL (ref 3.5–5.0)
ALKALINE PHOSPHATASE: 102 U/L (ref 38–126)
ALT (SGPT): 27 U/L (ref 19–72)
ANION GAP: 8 mmol/L — ABNORMAL LOW (ref 9–15)
AST (SGOT): 26 U/L (ref 19–55)
BLOOD UREA NITROGEN: 29 mg/dL — ABNORMAL HIGH (ref 7–21)
BUN / CREAT RATIO: 15
CALCIUM: 9.5 mg/dL (ref 8.5–10.2)
CHLORIDE: 104 mmol/L (ref 98–107)
CO2: 27 mmol/L (ref 22.0–30.0)
CREATININE: 1.89 mg/dL — ABNORMAL HIGH (ref 0.70–1.30)
EGFR MDRD AF AMER: 42 mL/min/{1.73_m2} — ABNORMAL LOW (ref >=60)
EGFR MDRD NON AF AMER: 34 mL/min/{1.73_m2} — ABNORMAL LOW (ref >=60)
GLUCOSE RANDOM: 191 mg/dL — ABNORMAL HIGH (ref 65–179)
POTASSIUM: 4.7 mmol/L (ref 3.5–5.0)
PROTEIN TOTAL: 6 g/dL — ABNORMAL LOW (ref 6.5–8.3)
SODIUM: 139 mmol/L (ref 135–145)

## 2018-02-15 LAB — CALCIUM: Calcium:MCnc:Pt:Ser/Plas:Qn:: 9.5

## 2018-02-15 LAB — CBC W/ AUTO DIFF
BASOPHILS ABSOLUTE COUNT: 0.1 10*9/L (ref 0.0–0.1)
BASOPHILS RELATIVE PERCENT: 0.9 %
EOSINOPHILS ABSOLUTE COUNT: 0.2 10*9/L (ref 0.0–0.4)
EOSINOPHILS RELATIVE PERCENT: 3.1 %
HEMOGLOBIN: 11.9 g/dL — ABNORMAL LOW (ref 13.5–17.5)
LYMPHOCYTES ABSOLUTE COUNT: 1.5 10*9/L (ref 1.5–5.0)
LYMPHOCYTES RELATIVE PERCENT: 21.6 %
MEAN CORPUSCULAR HEMOGLOBIN CONC: 34.7 g/dL (ref 31.0–37.0)
MEAN CORPUSCULAR HEMOGLOBIN: 31.6 pg (ref 26.0–34.0)
MEAN CORPUSCULAR VOLUME: 91.2 fL (ref 80.0–100.0)
MEAN PLATELET VOLUME: 7.7 fL (ref 7.0–10.0)
MONOCYTES ABSOLUTE COUNT: 0.4 10*9/L (ref 0.2–0.8)
MONOCYTES RELATIVE PERCENT: 5.4 %
NEUTROPHILS ABSOLUTE COUNT: 4.5 10*9/L (ref 2.0–7.5)
NEUTROPHILS RELATIVE PERCENT: 66.4 %
PLATELET COUNT: 194 10*9/L (ref 150–440)
RED BLOOD CELL COUNT: 3.78 10*12/L — ABNORMAL LOW (ref 4.50–5.90)
RED CELL DISTRIBUTION WIDTH: 14.6 % (ref 12.0–15.0)
WBC ADJUSTED: 6.7 10*9/L (ref 4.5–11.0)

## 2018-02-15 LAB — MEAN CORPUSCULAR HEMOGLOBIN: Lab: 31.6

## 2018-02-15 LAB — PROSTATE SPECIFIC ANTIGEN: Prostate specific Ag:MCnc:Pt:Ser/Plas:Qn:: 0.17

## 2018-02-15 NOTE — Unmapped (Signed)
Lab drawn and sent for analysis.

## 2018-02-15 NOTE — Unmapped (Signed)
CC: f/u prostate cancer    A&P:    1.  mCRPC:    ?? Lupron 22.5 mg today, due again on or after 05/16/18  ?? Rec daily calcium and vitamin D  ?? Continue good self care  ?? Restart enzalutamide 80 mg daily  ?? Continue carvedilol to 37.5 mg BID. Increase HCTS to 50 mg/daily (from 25 mg). I wrote this down for him. Cont to monitor BPs at home and notify me of any abnormal values.  ?? Good PSA response    RTC ~ 1 month for labs, [facility] [place]    Approximately 25 minutes was spent in the care of this patient, half in education and counseling.  A copy of the AVS was provided to the patient which includes key points from our visit as well as our contact information.      Albertina Parr, NP-C  Adult Nurse Practitioner  Urology & Medical Oncology      Onc history:    --Status post a radical prostatectomy in 2001 in [place].   --He had salvage radiotherapy in 2002 and had  been followed with a gradually rising PSA. He was found to have multiple pulmonary nodules, but no bone metastases.   --Lupron was started in early 2009 with dramatic reduction in his pulmonary disease.    --His PSA has been rising on ADT, so Casodex (2010, PSA 9.6) was started and subsequently Zytiga (04/2011, for sx C7 lesion without PSA rise).  --XRT C4-T2 2012 before starting Zytiga  --He discontinued Zytiga because he had substantial weight gain on Zytiga and Prednisone.   --He also discontinued Xtandi (started in 05/2012) because of fatigue and rash, joint swelling.    In looking back through his notes it looks like his Lupron was stoppped when he started casodex in 2010    Despite the side effects, he had a sustained biochemical response. He fractured a vertebrae falling off of a ladder and now has chronic back pain.    --Restarted primary ADT with Lupron 22.5 mg in 03/2016  --PSA rise; Scans 11/2017 with increased size of 1 pulm nodule and LAN  --Started Xtandi end of 11/2017 at a reduced dose of 80 mg in an effort to minimize SE        HISTORY OF PRESENT ILLNESS: Michael Stokes returns today for scheduled followup.    He has been off the enza for one week due to HTN. He increased his carvedilol as directed.     He was taking his BP at home and had some high readings (SBP >160). I instructed him to increase his HCTZ, but he had not done this yet    Manual BP 142/82 today    He has a cuff at home      Allergies   Allergen Reactions   ??? Amlodipine Swelling   ??? Topiramate Swelling   ??? Arb-Angiotensin Receptor Antagonist Other (See Comments)     Hyperkalemia   ??? Zolpidem Other (See Comments)     Hangover     Meds: Updated in EPIC    10 pt ROS reviewed and negative except per HPI           VITAL SIGNS:          Vitals:    02/15/18 1129   BP: 163/74   Pulse: 69   Resp: 18   Temp: 36.9 ??C (98.4 ??F)   SpO2: 97%     GENERAL: Pleasant, NAD.   VS: Reviewed and  unremarkable  Head: normocephalic  Eyes: sclera aniecteric  Pulm: Non labored WOB; CTA bil  CV: RRR; no MRG  Skin: Warm, dry  Msk: No Le edema  NEURO: A&O x 3          Data:    CT CAP: 5/17  IMPRESSION:   1. New retroperitoneal and pelvic adenopathy concerning for metastatic disease.  2. Pulmonary nodules, at least one of which is slightly increased in size, and a few of which are new compared to prior, concerning for metastatic disease.    Bone scan: 5/17  IMPRESSION:   -Grossly unchanged focal uptake in the right aspect of C7. No new osseous lesion identified.    CT CAP 11/2017    Impression     - Increased size of right lower lobe pulmonary nodule.    - Enlarged aortocaval lymph node now measuring 1.7 cm. Decreased size of previously seen retrocaval and external iliac lymph nodes.       Bone scan 11/2017    Impression     Increased focal uptake at C7 compared to most recent bone scan (03/20/2016) is concerning for recurrent disease, though is indeterminate given known history of metastatic lesion at this site.         Lab Results   Component Value Date    PSA 0.17 02/15/2018    PSA 1.69 01/14/2018    PSA 18.00 (H) 12/03/2017 PSA 15.90 (H) 11/16/2017    PSA 7.35 (H) 08/03/2017    PSA 6.99 (H) 04/02/2017     03/27/2015 = 3.9     PSA Diagnostic   Date Value Ref Range Status   12/25/2014 3.9 0.0 - 4.0 ng/mL Final   10/20/2014 1.7 0.0 - 4.0 ng/mL Final   09/18/2014 2.1 0.0 - 4.0 ng/mL Final   06/12/2014 1.3 0.0 - 4.0 ng/mL Final   03/09/2014 0.5 0.0 - 4.0 ng/mL Final   12/08/2013 0.2 0.0 - 4.0 ng/mL Final   09/08/2013 <0.1 0.0 - 4.0 ng/mL Final   06/09/2013 <0.1 0.0 - 4.0 ng/mL Final   03/03/2013 <0.1 0.0 - 4.0 ng/mL Final   12/02/2012 <0.1 0.0 - 4.0 NG/ML Final   08/26/2012 <0.1 0.0 - 4.0 NG/ML Final   05/28/2012 <0.1 0.0 - 4.0 NG/ML Final   02/26/2012 <0.1 0.0 - 4.0 NG/ML Final   12/04/2011 <0.1 0.0 - 4.0 NG/ML Final   07/24/2011 1.4 0.0 - 4.0 NG/ML Final   04/17/2011 3.0 0.0 - 4.0 NG/ML Final   01/09/2011 3.1 0.0 - 4.0 NG/ML Final     2010-10-10 PSA, DIAGNOSTIC 2.3 NG/ML 0.0-4.0    2010-06-20 PSA, DIAGNOSTIC 1.2 NG/ML 0.0-4.0    2010-01-24 PSA, DIAGNOSTIC 1.1 NG/ML 0.0-4.0           His PSA increased previously to 9.6 . In 04/26/09 it was 3.7, 01/18/09 it was 1.2.

## 2018-02-15 NOTE — Unmapped (Addendum)
Blood pressure medicine:    Carvedilol: 37.5 mg two times/day (1.5 tabs two times/day)  Hydrochlorathiazide: Increase to 50 mg/day. If you have the 12.5 mg tablets, this would be 4 of those. If you have the 25 mg tablets, it would be be 2.     Keep taking your cancer medicine    Lupron today, again in 3 months    Lab Results   Component Value Date    PSA 0.17 02/15/2018    PSA 1.69 01/14/2018    PSA 18.00 (H) 12/03/2017    PSA 15.90 (H) 11/16/2017    PSA 7.35 (H) 08/03/2017    PSA 6.99 (H) 04/02/2017           Albertina Parr, NP-C, OCN  Adult Nurse Practitioner  Urology and Medical Oncology    If you have been prescribed a medication today, always read the package insert that comes with the medication.    In the event of an emergency, always call 911    Urology:  Main clinic & [facility]: [phone]  Fax: [phone]    Cancer Hospital:  Phone: [phone]  Fax: [phone]    After hours/nights/weekends:  Hospital Operator: [phone]    [email or web address]  [email or web address]

## 2018-02-19 NOTE — Unmapped (Signed)
I was the supervising provider for the service delivered.    Jillene Bucks

## 2018-03-04 NOTE — Unmapped (Signed)
[facility] Refill Coordination Note  Specialty Medication(s): Xtandi 40mg   Additional Medications shipped: none    [name], DOB: 04-11-1935  Phone: [phone] (home) , Alternate phone contact: N/A  Phone or address changes today?: No  All above HIPAA information was verified with patient.  Shipping Address: [address]  [place] [address]   Insurance changes? No    Completed refill call assessment today to schedule patient's medication shipment from the [facility] [phone]).      Confirmed the medication and dosage are correct and have not changed: Yes, regimen is correct and unchanged.    Confirmed patient started or stopped the following medications in the past month:  No, there are no changes reported at this time.    Are you tolerating your medication?:  Zollie Beckers reports tolerating the medication.    ADHERENCE    Did you miss any doses in the past 4 weeks? No missed doses reported.    FINANCIAL/SHIPPING    Delivery Scheduled: Yes, Expected medication delivery date: 03/11/18     The patient will receive an FSI print out for each medication shipped and additional FDA Medication Guides as required.  Patient education from [place] or [employer] may also be included in the shipment    Zollie Beckers did not have any additional questions at this time.    Delivery address validated in FSI scheduling system: Yes, address listed in FSI is correct.    We will follow up with patient monthly for standard refill processing and delivery.      Thank you,  Lupita Shutter   [facility] Specialty Pharmacist

## 2018-03-05 NOTE — Unmapped (Signed)
The Internal Medicine Enhanced Care team was unable to reach Michael Stokes to follow-up with the patient in regards to the Chronic Care Management program. We have made one attempts to contact patient. A message was left with patient's wife with a request to return our call.

## 2018-03-08 NOTE — Unmapped (Signed)
The Internal Medicine Enhanced Care team was unable to reach [name] to follow-up with the patient in regards to the Chronic Care Management program. We have made two attempts to contact patient. A message was left with patient's wife with a request to return our call.

## 2018-03-10 MED FILL — XTANDI/40MG/CAPS: XTANDI/40MG/CAPS | 30 days supply | Qty: 60 | Fill #3

## 2018-03-18 ENCOUNTER — Ambulatory Visit
Admit: 2018-03-18 | Discharge: 2018-03-18 | Payer: PRIVATE HEALTH INSURANCE | Attending: Nurse Practitioner | Primary: Nurse Practitioner

## 2018-03-18 ENCOUNTER — Other Ambulatory Visit: Admit: 2018-03-18 | Discharge: 2018-03-18 | Payer: PRIVATE HEALTH INSURANCE

## 2018-03-18 DIAGNOSIS — R590 Localized enlarged lymph nodes: Secondary | ICD-10-CM | POA: Diagnosis not present

## 2018-03-18 DIAGNOSIS — I10 Essential (primary) hypertension: Secondary | ICD-10-CM | POA: Diagnosis not present

## 2018-03-18 DIAGNOSIS — M549 Dorsalgia, unspecified: Secondary | ICD-10-CM | POA: Diagnosis not present

## 2018-03-18 DIAGNOSIS — R911 Solitary pulmonary nodule: Secondary | ICD-10-CM | POA: Diagnosis not present

## 2018-03-18 DIAGNOSIS — C61 Malignant neoplasm of prostate: Secondary | ICD-10-CM | POA: Diagnosis not present

## 2018-03-18 DIAGNOSIS — R918 Other nonspecific abnormal finding of lung field: Secondary | ICD-10-CM | POA: Diagnosis not present

## 2018-03-18 DIAGNOSIS — Z923 Personal history of irradiation: Secondary | ICD-10-CM | POA: Diagnosis not present

## 2018-03-18 LAB — COMPREHENSIVE METABOLIC PANEL
ALBUMIN: 3.9 g/dL (ref 3.5–5.0)
ALKALINE PHOSPHATASE: 105 U/L (ref 38–126)
ALT (SGPT): 29 U/L (ref 19–72)
ANION GAP: 14 mmol/L (ref 9–15)
AST (SGOT): 25 U/L (ref 19–55)
BILIRUBIN TOTAL: 0.7 mg/dL (ref 0.0–1.2)
BLOOD UREA NITROGEN: 38 mg/dL — ABNORMAL HIGH (ref 7–21)
BUN / CREAT RATIO: 17
CALCIUM: 9.6 mg/dL (ref 8.5–10.2)
CHLORIDE: 99 mmol/L (ref 98–107)
CO2: 26 mmol/L (ref 22.0–30.0)
CREATININE: 2.18 mg/dL — ABNORMAL HIGH (ref 0.70–1.30)
EGFR MDRD AF AMER: 35 mL/min/{1.73_m2} — ABNORMAL LOW (ref >=60)
EGFR MDRD NON AF AMER: 29 mL/min/{1.73_m2} — ABNORMAL LOW (ref >=60)
POTASSIUM: 4.2 mmol/L (ref 3.5–5.0)
PROTEIN TOTAL: 6.2 g/dL — ABNORMAL LOW (ref 6.5–8.3)
SODIUM: 139 mmol/L (ref 135–145)

## 2018-03-18 LAB — CBC W/ AUTO DIFF
BASOPHILS ABSOLUTE COUNT: 0.1 10*9/L (ref 0.0–0.1)
BASOPHILS RELATIVE PERCENT: 1 %
EOSINOPHILS ABSOLUTE COUNT: 0.2 10*9/L (ref 0.0–0.4)
EOSINOPHILS RELATIVE PERCENT: 3.2 %
HEMATOCRIT: 35.4 % — ABNORMAL LOW (ref 41.0–53.0)
HEMOGLOBIN: 12.1 g/dL — ABNORMAL LOW (ref 13.5–17.5)
LARGE UNSTAINED CELLS: 2 % (ref 0–4)
LYMPHOCYTES ABSOLUTE COUNT: 1.4 10*9/L — ABNORMAL LOW (ref 1.5–5.0)
LYMPHOCYTES RELATIVE PERCENT: 20.1 %
MEAN CORPUSCULAR HEMOGLOBIN CONC: 34.1 g/dL (ref 31.0–37.0)
MEAN CORPUSCULAR HEMOGLOBIN: 31.3 pg (ref 26.0–34.0)
MEAN CORPUSCULAR VOLUME: 91.8 fL (ref 80.0–100.0)
MEAN PLATELET VOLUME: 7.9 fL (ref 7.0–10.0)
MONOCYTES RELATIVE PERCENT: 5 %
NEUTROPHILS ABSOLUTE COUNT: 4.7 10*9/L (ref 2.0–7.5)
NEUTROPHILS RELATIVE PERCENT: 68.7 %
RED BLOOD CELL COUNT: 3.86 10*12/L — ABNORMAL LOW (ref 4.50–5.90)
RED CELL DISTRIBUTION WIDTH: 14.9 % (ref 12.0–15.0)
WBC ADJUSTED: 6.9 10*9/L (ref 4.5–11.0)

## 2018-03-18 LAB — ALKALINE PHOSPHATASE: Alkaline phosphatase:CCnc:Pt:Ser/Plas:Qn:: 105

## 2018-03-18 LAB — NEUTROPHILS RELATIVE PERCENT: Lab: 68.7

## 2018-03-18 LAB — PROSTATE SPECIFIC ANTIGEN: Prostate specific Ag:MCnc:Pt:Ser/Plas:Qn:: <0.1

## 2018-03-18 NOTE — Unmapped (Addendum)
Blood pressure medicine:    Carvedilol: 37.5 mg two times/day (1.5 tabs two times/day)  Hydrochlorathiazide: Increase to 50 mg/day. If you have the 12.5 mg tablets, this would be 4 of those. If you have the 25 mg tablets, it would be be 2.     Keep taking your cancer medicine    Come back in about 2 months when your shot is due    In the meantime, send me some blood pressure readings    Make sure to drink plenty of water      Lab Results   Component Value Date    PSA <0.10 03/18/2018    PSA 0.17 02/15/2018    PSA 1.69 01/14/2018    PSA 18.00 (H) 12/03/2017    PSA 15.90 (H) 11/16/2017    PSA 7.35 (H) 08/03/2017           Albertina Parr, NP-C, OCN  Adult Nurse Practitioner  Urology and Medical Oncology    If you have been prescribed a medication today, always read the package insert that comes with the medication.    In the event of an emergency, always call 911    Urology:  Main clinic & [facility]: [phone]  Fax: [phone]    Cancer Hospital:  Phone: [phone]  Fax: [phone]    After hours/nights/weekends:  Hospital Operator: [phone]    [email or web address]  [email or web address]

## 2018-03-18 NOTE — Unmapped (Signed)
CC: f/u prostate cancer    A&P:    1.  mCRPC:    ?? Lupron 22.5 mg  due again on or after 05/16/18  ?? Rec daily calcium and vitamin D  ?? Continue good self care  ?? Restart enzalutamide 80 mg daily  ?? Continue carvedilol to 37.5 mg BID. Increase HCTS to 50 mg/daily (from 25 mg). I wrote this down for him. Cont to monitor BPs at home and notify me of any abnormal values.  ?? Good PSA response    RTC ~ 2 months  for labs, Lupron, clinic visit     Approximately 25 minutes was spent in the care of this patient, half in education and counseling.  A copy of the AVS was provided to the patient which includes key points from our visit as well as our contact information.      Albertina Parr, NP-C  Adult Nurse Practitioner  Urology & Medical Oncology      Onc history:    --Status post a radical prostatectomy in 2001 in [place].   --He had salvage radiotherapy in 2002 and had  been followed with a gradually rising PSA. He was found to have multiple pulmonary nodules, but no bone metastases.   --Lupron was started in early 2009 with dramatic reduction in his pulmonary disease.    --His PSA has been rising on ADT, so Casodex (2010, PSA 9.6) was started and subsequently Zytiga (04/2011, for sx C7 lesion without PSA rise).  --XRT C4-T2 2012 before starting Zytiga  --He discontinued Zytiga because he had substantial weight gain on Zytiga and Prednisone.   --He also discontinued Xtandi (started in 05/2012) because of fatigue and rash, joint swelling.    In looking back through his notes it looks like his Lupron was stoppped when he started casodex in 2010    Despite the side effects, he had a sustained biochemical response. He fractured a vertebrae falling off of a ladder and now has chronic back pain.    --Restarted primary ADT with Lupron 22.5 mg in 03/2016  --PSA rise; Scans 11/2017 with increased size of 1 pulm nodule and LAN  --Started Xtandi end of 11/2017 at a reduced dose of 80 mg in an effort to minimize SE        HISTORY OF PRESENT ILLNESS:     Michael Stokes returns today for scheduled followup.    He has been doing well  Compliant with enza and anti-htns  Denies SE r/t enza    No LUTS, bone pain, weight loss    BPs at home 130s-150/80          Allergies   Allergen Reactions   ??? Amlodipine Swelling   ??? Topiramate Swelling   ??? Arb-Angiotensin Receptor Antagonist Other (See Comments)     Hyperkalemia   ??? Zolpidem Other (See Comments)     Hangover     Meds: Updated in EPIC    10 pt ROS reviewed and negative except per HPI           VITAL SIGNS:          Vitals:    03/18/18 1042   BP: 153/72   Pulse: 69   Resp: 18   Temp: 36.7 ??C (98.1 ??F)     GENERAL: Pleasant, NAD.   VS: Reviewed and unremarkable  Head: normocephalic  Eyes: sclera aniecteric  Pulm: Non labored WOB; CTA bil  CV: RRR; no MRG  Skin: Warm, dry  Msk:  No Le edema  NEURO: A&O x 3          Data:    CT CAP: 5/17  IMPRESSION:   1. New retroperitoneal and pelvic adenopathy concerning for metastatic disease.  2. Pulmonary nodules, at least one of which is slightly increased in size, and a few of which are new compared to prior, concerning for metastatic disease.    Bone scan: 5/17  IMPRESSION:   -Grossly unchanged focal uptake in the right aspect of C7. No new osseous lesion identified.    CT CAP 11/2017    Impression     - Increased size of right lower lobe pulmonary nodule.    - Enlarged aortocaval lymph node now measuring 1.7 cm. Decreased size of previously seen retrocaval and external iliac lymph nodes.       Bone scan 11/2017    Impression     Increased focal uptake at C7 compared to most recent bone scan (03/20/2016) is concerning for recurrent disease, though is indeterminate given known history of metastatic lesion at this site.         Lab Results   Component Value Date    PSA <0.10 03/18/2018    PSA 0.17 02/15/2018    PSA 1.69 01/14/2018    PSA 18.00 (H) 12/03/2017    PSA 15.90 (H) 11/16/2017    PSA 7.35 (H) 08/03/2017     03/27/2015 = 3.9     PSA Diagnostic   Date Value Ref Range Status   12/25/2014 3.9 0.0 - 4.0 ng/mL Final   10/20/2014 1.7 0.0 - 4.0 ng/mL Final   09/18/2014 2.1 0.0 - 4.0 ng/mL Final   06/12/2014 1.3 0.0 - 4.0 ng/mL Final   03/09/2014 0.5 0.0 - 4.0 ng/mL Final   12/08/2013 0.2 0.0 - 4.0 ng/mL Final   09/08/2013 <0.1 0.0 - 4.0 ng/mL Final   06/09/2013 <0.1 0.0 - 4.0 ng/mL Final   03/03/2013 <0.1 0.0 - 4.0 ng/mL Final   12/02/2012 <0.1 0.0 - 4.0 NG/ML Final   08/26/2012 <0.1 0.0 - 4.0 NG/ML Final   05/28/2012 <0.1 0.0 - 4.0 NG/ML Final   02/26/2012 <0.1 0.0 - 4.0 NG/ML Final   12/04/2011 <0.1 0.0 - 4.0 NG/ML Final   07/24/2011 1.4 0.0 - 4.0 NG/ML Final   04/17/2011 3.0 0.0 - 4.0 NG/ML Final   01/09/2011 3.1 0.0 - 4.0 NG/ML Final     2010-10-10 PSA, DIAGNOSTIC 2.3 NG/ML 0.0-4.0    2010-06-20 PSA, DIAGNOSTIC 1.2 NG/ML 0.0-4.0    2010-01-24 PSA, DIAGNOSTIC 1.1 NG/ML 0.0-4.0           His PSA increased previously to 9.6 . In 04/26/09 it was 3.7, 01/18/09 it was 1.2.

## 2018-03-18 NOTE — Unmapped (Signed)
4540:  Labs drawn and sent for analysis.  Care provided by  Will Bonnet.

## 2018-03-22 NOTE — Unmapped (Signed)
The Internal Medicine Enhanced Care team was unable to reach Michael Stokes to follow-up with the patient in regards to the Chronic Care Management program. We have made one attempts to contact patient. A voicemail was left with a request to return our call.

## 2018-03-23 NOTE — Unmapped (Signed)
Patient Plan of Care updated. Please review and make changes as needed.

## 2018-03-25 NOTE — Unmapped (Signed)
What do I need to do [name]?    [name]

## 2018-03-30 NOTE — Unmapped (Signed)
Reviewed and approved

## 2018-03-31 NOTE — Unmapped (Signed)
[facility] Refill Coordination Note  Specialty Medication(s): Xtandi 40 mg 2 cap once daily  Additional Medications shipped: None    Michael Stokes, DOB: [birth year]  Phone: [phone] (home) , Alternate phone contact: N/A  Phone or address changes today?: No  All above HIPAA information was verified with patient.  Shipping Address: [address]  [place] [address]   Insurance changes? No    Completed refill call assessment today to schedule patient's medication shipment from the [facility] [phone]).      Confirmed the medication and dosage are correct and have not changed: Yes, regimen is correct and unchanged.    Confirmed patient started or stopped the following medications in the past month:  No, there are no changes reported at this time.    Are you tolerating your medication?:  Michael Stokes reports tolerating the medication.    ADHERENCE    Did you miss any doses in the past 4 weeks? No missed doses reported.    FINANCIAL/SHIPPING    Delivery Scheduled: Yes, Expected medication delivery date: 04/14/18 COURIER    The patient will receive an FSI print out for each medication shipped and additional FDA Medication Guides as required.  Patient education from [place] or Robet Leu may also be included in the shipment    Michael Stokes did not have any additional questions at this time.    Delivery address validated in FSI scheduling system: Yes, address listed in FSI is correct.    We will follow up with patient monthly for standard refill processing and delivery.      Thank you,  Burnett Corrente   [facility] Specialty PharmD Candidate

## 2018-04-06 ENCOUNTER — Ambulatory Visit: Admit: 2018-04-06 | Discharge: 2018-04-07 | Payer: PRIVATE HEALTH INSURANCE

## 2018-04-06 DIAGNOSIS — E119 Type 2 diabetes mellitus without complications: Secondary | ICD-10-CM | POA: Diagnosis not present

## 2018-04-06 DIAGNOSIS — E118 Type 2 diabetes mellitus with unspecified complications: Secondary | ICD-10-CM | POA: Diagnosis not present

## 2018-04-06 DIAGNOSIS — C61 Malignant neoplasm of prostate: Secondary | ICD-10-CM | POA: Diagnosis not present

## 2018-04-06 DIAGNOSIS — Z794 Long term (current) use of insulin: Secondary | ICD-10-CM | POA: Diagnosis not present

## 2018-04-06 DIAGNOSIS — I10 Essential (primary) hypertension: Secondary | ICD-10-CM | POA: Diagnosis not present

## 2018-04-06 DIAGNOSIS — R197 Diarrhea, unspecified: Secondary | ICD-10-CM | POA: Diagnosis not present

## 2018-04-06 MED ORDER — INSULIN ASPART (U-100) 100 UNIT/ML (3 ML) SUBCUTANEOUS PEN
prn refills | 0 days | Status: CP
Start: 2018-04-06 — End: 2018-05-26

## 2018-04-06 NOTE — Unmapped (Signed)
Assessment/Plan:     Lab Results   Component Value Date    A1C 8.0 (H) 04/06/2018    A1C 9.0 (H) 01/07/2018    A1C 8.2 (H) 10/09/2017       1. DM2 - improving control.  Continue Lantus 56u niightly, Novolog 8u prior to bkfst and lunch.  Increase to 10u prior to dinner.  Continue ASA, statin, ARB.  Foot exam and retinal pics today in clinic.  Reassess a1c 3 mo  2. htn - moderately controlled on recheck, continue regimen  3. Diarrhea - ok to use otc tx  4. Prostate cancer - appears BG ok w/ initiation of Xtandi approx 2 months ago, continue to monitor    F/u: 3 mo a1c    Subject/Objective:     Chief Complaint   Patient presents with   ??? Follow-up     S: 83 y.o. year old male with PMHx significant for   Past Medical History:   Diagnosis Date   ??? Cancer with pulmonary metastases (CMS-HCC)    ??? Chronic kidney disease    ??? Depression    ??? Diabetes Mellitus, uncontrolled 06/06/2009   ??? GERD (gastroesophageal reflux disease)    ??? Hypertension    ??? Hypertension 06/06/2009   ??? Peripheral neuropathy    .     Patient Active Problem List   Diagnosis   ??? Diabetes   ??? Hypertension   ??? Malignant neoplasm of prostate (CMS-HCC)   ??? Polyneuropathy in diabetes (CMS-HCC)   ??? Hypercholesterolemia   ??? Cancer with pulmonary metastases (CMS-HCC)   ??? Dupuytren's contracture of both hands   ??? Rotator cuff tear   ??? Kyphosis, acquired   ??? Durable power of attorney for healthcare exists but copy not available   ??? CKD stage 3 due to type 2 diabetes mellitus (CMS-HCC)   ??? Gout   ??? Enrolled in chronic care management       Presents for DM f/u.  Most FBG in 89-130 range, no lows.  Dinner pre-prandials mostly in 83-130 range, no lows.  Few in 150-170 range.  qhs readings mostly in 90-160 range, no lows.  Highest 201.  Started on Xtandi (may increase BG) for prostate cancer approx 2 months ago.  More active, working outside more.  Developed diarrhea after eating some fruit approx 3 days ago, improving.  No f/ch, no blood.         Your Medication List Accurate as of 04/06/18  9:58 AM. If you have any questions, ask your nurse or doctor.               CHANGE how you take these medications    carvedilol 25 MG tablet  Commonly known as:  COREG  Take 1.5 tablets (37.5 mg total) by mouth Two (2) times a day.  What changed:  how much to take        CONTINUE taking these medications    allopurinol 100 MG tablet  Commonly known as:  ZYLOPRIM  Take 2 tablets (200 mg total) by mouth daily.     amitriptyline 25 MG tablet  Commonly known as:  ELAVIL  TAKE 1 TABLET(25 MG) BY MOUTH EVERY NIGHT     aspirin 81 MG tablet  Commonly known as:  ENTERIC COATED ASPIRIN  Take 1 tablet (81 mg total) by mouth daily.     atorvastatin 40 MG tablet  Commonly known as:  LIPITOR  Take 1 tablet (40 mg total) by mouth daily.  blood sugar diagnostic Strp  Commonly known as:  FREESTYLE LITE STRIPS  by Other route Two (2) times a day.     cholecalciferol (vitamin D3) 1,000 unit tablet  2000u daily     enzalutamide 40 mg capsule  Commonly known as:  XTANDI  Take 2 capsules (80 mg total) by mouth daily.     hydroCHLOROthiazide 12.5 MG tablet  Commonly known as:  HYDRODIURIL  Take 2 tablets (25 mg total) by mouth daily.     insulin ASPART 100 unit/mL (3 mL) injection pen  Commonly known as:  NovoLOG FLEXPEN  8u TID AC     insulin glargine 100 unit/mL (3 mL) injection pen  Commonly known as:  LANTUS SOLOSTAR U-100 INSULIN  Inject 0.56 mL (56 Units total) under the skin daily.     losartan 100 MG tablet  Commonly known as:  COZAAR  Take 1 tablet (100 mg total) by mouth daily.     pen needle, diabetic 32 gauge x 5/32 Ndle  Commonly known as:  BD ULTRA-FINE NANO PEN NEEDLE  BID as directed w/ insulin     venlafaxine 75 MG 24 hr capsule  Commonly known as:  EFFEXOR-XR  Take 1 capsule (75 mg total) by mouth daily.            Allergies   Allergen Reactions   ??? Amlodipine Swelling   ??? Topiramate Swelling   ??? Arb-Angiotensin Receptor Antagonist Other (See Comments)     Hyperkalemia   ??? Zolpidem Other (See Comments)     Hangover        ROS: negative for vision changes, CP, abdominal pain, bowel/bladder changes, bleeding, lower extremity edema    O:  Vital Signs:    Vitals:    04/06/18 0939   BP: 147/70   Pulse: 74   SpO2: 95%   Weight: 96.1 kg (211 lb 14.4 oz)       Physical Exam  General Appearance:    Alert, cooperative, no distress, appears stated age   Head:    Normocephalic, without obvious abnormality, atraumatic   Eyes:    PERRL, conjunctiva/corneas clear, EOM's intact, fundi     benign, both eyes   Ears:    Normal TM's and external ear canals, both ears   Throat:   Lips, mucosa, and tongue normal   Neck:   Supple, symmetrical, trachea midline, no adenopathy;     No JVD bilaterally.   Lungs:     Clear to auscultation bilaterally, respirations unlabored   Chest Wall:    No tenderness or deformity    Heart:    Regular rate and rhythm, S1 and S2 normal, no murmur, rub    or gallop   Abdomen:     Soft, non-tender, bowel sounds active all four quadrants,     no masses, no organomegaly       Extremities:   Extremities normal, atraumatic, no cyanosis or edema   Psych :  No agitation, anxiety, or depressed mood.

## 2018-04-06 NOTE — Unmapped (Signed)
Addended by: Rochele Raring on: 04/06/2018 11:17 AM     Modules accepted: Orders

## 2018-04-06 NOTE — Unmapped (Addendum)
1. Increase Novolog (meal-time insulin) to 10 units prior to dinner.  Continue 8 units prior to breakfast and lunch.  2. Continue all other meds/insulin  3. Congratulations on weight loss, continue efforts

## 2018-04-13 MED ORDER — HYDROCHLOROTHIAZIDE 12.5 MG TABLET
ORAL_TABLET | Freq: Every day | ORAL | 3 refills | 0.00000 days | Status: CP
Start: 2018-04-13 — End: 2018-04-19

## 2018-04-14 MED FILL — XTANDI/40MG/CAPS: XTANDI/40MG/CAPS | 30 days supply | Qty: 60 | Fill #4

## 2018-04-19 MED ORDER — HYDROCHLOROTHIAZIDE 50 MG TABLET
ORAL_TABLET | Freq: Every day | ORAL | 3 refills | 0 days | Status: CP
Start: 2018-04-19 — End: 2018-08-27

## 2018-04-19 MED ORDER — CARVEDILOL 25 MG TABLET
ORAL_TABLET | Freq: Two times a day (BID) | ORAL | 3 refills | 0 days | Status: CP
Start: 2018-04-19 — End: 2018-07-01

## 2018-04-19 NOTE — Unmapped (Signed)
Hi,     Patient contacted the Communication Center regarding the following:    - Patient stated that he is having some refill issues with the following medications carvedilol and hydrochlorothiazide.    Please contact patient at [phone].    Thanks in advance,    Tylene Fantasia  [facility]   [phone]

## 2018-04-19 NOTE — Unmapped (Signed)
AOC Triage Note     Patient: Michael Stokes     Reason for call:  medication changes    Time call returned: 1405   Phone Assessment: Spoke with Mr. Early Chars.  On his last provider visit on 5/6, changed were made to HCTZ up to 50 mg and carvedilol up to 37.5mg .  Needs new prescriptions sent to [facility] pharmacy for refill.    Triage Recommendations:message provider   Patient Response: verbalizes understanding     Outstanding tasks: see message, new scripts needed for HTCZ and carvedilol, thanks     Patient Pharmacy has been verified and primary pharmacy has been marked as preferred

## 2018-04-19 NOTE — Unmapped (Signed)
Rx sent for  HCTZ 50 mg PO daily (changed to the 50 mg tab so he only has to take one)  Carvedilol 37.5 mg BID (this is 1.5 tabs BID)  Can you let him know?  thanks

## 2018-05-03 MED ORDER — INSULIN GLARGINE (U-100) 100 UNIT/ML (3 ML) SUBCUTANEOUS PEN
Freq: Every day | SUBCUTANEOUS | 3 refills | 0 days | Status: CP
Start: 2018-05-03 — End: 2018-05-26

## 2018-05-03 MED ORDER — PEN NEEDLE, DIABETIC 32 GAUGE X 5/32" (4 MM)
3 refills | 0 days | Status: CP
Start: 2018-05-03 — End: 2018-12-16

## 2018-05-06 NOTE — Unmapped (Signed)
[facility] Refill Coordination Note    Specialty Medication(s) to be Shipped:   Hematology/Oncology: Michael Stokes    Other medication(s) to be shipped: NO     Michael Stokes, DOB: 1934-11-22  Phone: [phone] (home)   Shipping Address: [address]  [place] [address]    All above HIPAA information was verified with patient.     Completed refill call assessment today to schedule patient's medication shipment from the [facility] [phone]).       Specialty medication(s) and dose(s) confirmed: Regimen is correct and unchanged.   Changes to medications: Michael Stokes reports no changes reported at this time.  Changes to insurance: No  Questions for the pharmacist: No    The patient will receive an FSI print out for each medication shipped and additional FDA Medication Guides as required.  Patient education from Michael Stokes or Michael Stokes may also be included in the shipment.    DISEASE-SPECIFIC INFORMATION        N/A    ADHERENCE     Medication Adherence    Patient reported X missed doses in the last month:  0          Refill Coordination    Has the Patients' Contact Information Changed:  No  Is the Shipping Address Different:  No         MEDICARE PART B DOCUMENTATION     Not Applicable    SHIPPING     Shipping address confirmed in FSI.     Delivery Scheduled: Yes, Expected medication delivery date: 05/18/18 via UPS or courier.     Mitzi Davenport   [facility] Specialty Technician

## 2018-05-17 MED FILL — XTANDI/40MG/CAPS: XTANDI/40MG/CAPS | 30 days supply | Qty: 60 | Fill #5

## 2018-05-20 ENCOUNTER — Other Ambulatory Visit: Admit: 2018-05-20 | Discharge: 2018-05-21 | Payer: PRIVATE HEALTH INSURANCE

## 2018-05-20 ENCOUNTER — Ambulatory Visit
Admit: 2018-05-20 | Discharge: 2018-05-21 | Payer: PRIVATE HEALTH INSURANCE | Attending: Nurse Practitioner | Primary: Nurse Practitioner

## 2018-05-20 DIAGNOSIS — I10 Essential (primary) hypertension: Secondary | ICD-10-CM | POA: Diagnosis not present

## 2018-05-20 DIAGNOSIS — R197 Diarrhea, unspecified: Secondary | ICD-10-CM | POA: Diagnosis not present

## 2018-05-20 DIAGNOSIS — M549 Dorsalgia, unspecified: Secondary | ICD-10-CM | POA: Diagnosis not present

## 2018-05-20 DIAGNOSIS — Z5111 Encounter for antineoplastic chemotherapy: Secondary | ICD-10-CM | POA: Diagnosis not present

## 2018-05-20 DIAGNOSIS — C61 Malignant neoplasm of prostate: Secondary | ICD-10-CM | POA: Diagnosis not present

## 2018-05-20 DIAGNOSIS — C7800 Secondary malignant neoplasm of unspecified lung: Secondary | ICD-10-CM | POA: Diagnosis not present

## 2018-05-20 LAB — CBC W/ AUTO DIFF
BASOPHILS ABSOLUTE COUNT: 0.1 10*9/L (ref 0.0–0.1)
BASOPHILS RELATIVE PERCENT: 0.8 %
EOSINOPHILS ABSOLUTE COUNT: 0.2 10*9/L (ref 0.0–0.4)
EOSINOPHILS RELATIVE PERCENT: 2.8 %
HEMATOCRIT: 34.2 % — ABNORMAL LOW (ref 41.0–53.0)
HEMOGLOBIN: 11.5 g/dL — ABNORMAL LOW (ref 13.5–17.5)
LARGE UNSTAINED CELLS: 3 % (ref 0–4)
LYMPHOCYTES ABSOLUTE COUNT: 1.2 10*9/L — ABNORMAL LOW (ref 1.5–5.0)
LYMPHOCYTES RELATIVE PERCENT: 19.6 %
MEAN CORPUSCULAR HEMOGLOBIN CONC: 33.6 g/dL (ref 31.0–37.0)
MEAN CORPUSCULAR HEMOGLOBIN: 31 pg (ref 26.0–34.0)
MEAN CORPUSCULAR VOLUME: 92.3 fL (ref 80.0–100.0)
MEAN PLATELET VOLUME: 8.2 fL (ref 7.0–10.0)
MONOCYTES RELATIVE PERCENT: 5.4 %
NEUTROPHILS ABSOLUTE COUNT: 4.3 10*9/L (ref 2.0–7.5)
NEUTROPHILS RELATIVE PERCENT: 68.9 %
RED BLOOD CELL COUNT: 3.71 10*12/L — ABNORMAL LOW (ref 4.50–5.90)
RED CELL DISTRIBUTION WIDTH: 15.1 % — ABNORMAL HIGH (ref 12.0–15.0)
WBC ADJUSTED: 6.2 10*9/L (ref 4.5–11.0)

## 2018-05-20 LAB — COMPREHENSIVE METABOLIC PANEL
ALBUMIN: 3.9 g/dL (ref 3.5–5.0)
ALKALINE PHOSPHATASE: 98 U/L (ref 38–126)
ALT (SGPT): 20 U/L (ref 19–72)
AST (SGOT): 26 U/L (ref 19–55)
BLOOD UREA NITROGEN: 30 mg/dL — ABNORMAL HIGH (ref 7–21)
BUN / CREAT RATIO: 17
CHLORIDE: 100 mmol/L (ref 98–107)
CO2: 28 mmol/L (ref 22.0–30.0)
CREATININE: 1.8 mg/dL — ABNORMAL HIGH (ref 0.70–1.30)
EGFR CKD-EPI AA MALE: 40 mL/min/{1.73_m2} — ABNORMAL LOW (ref >=60)
EGFR CKD-EPI NON-AA MALE: 35 mL/min/{1.73_m2} — ABNORMAL LOW (ref >=60)
GLUCOSE RANDOM: 217 mg/dL — ABNORMAL HIGH (ref 65–179)
POTASSIUM: 4.1 mmol/L (ref 3.5–5.0)
PROTEIN TOTAL: 6.2 g/dL — ABNORMAL LOW (ref 6.5–8.3)
SODIUM: 137 mmol/L (ref 135–145)

## 2018-05-20 LAB — MEAN PLATELET VOLUME: Lab: 8.2

## 2018-05-20 LAB — PROTEIN TOTAL: Protein:MCnc:Pt:Ser/Plas:Qn:: 6.2 — ABNORMAL LOW

## 2018-05-20 LAB — PROSTATE SPECIFIC ANTIGEN: Prostate specific Ag:MCnc:Pt:Ser/Plas:Qn:: <0.1

## 2018-05-20 NOTE — Unmapped (Signed)
Clinical Pharmacist Practitioner: [facility]    Oral Hormonal therapy Follow-Up    Assessment and recommendations:  1. Enzalutamide monitoring and side effect management- has minimal side effects associated with taking 80 mg daily. His blood pressure remains elevated BPs in clinic were 167/77 and 150/82. He does report new diarrhea.   - Hold enzaluamide for 1 week and restart after BP more well controlled   - Increase carvedilol to 50 mg BID    2. Diarrhea- has 3-4 bowel movements daily started 3 weeks ago he took imodium a few times but felt it wasn't working so stopped taking it.  - Take 2 tablets of imodium daily in the morning my repeat 1 tablet at additional loose stool.     Follow- up: 1 month f/u    ______________________________________________________________________    Oral Hormonal therapy: Enzalutamide 80 mg daily (dose decreased due to BP, holding for 1 week)     Start date: 12/17/17    HPI: Status post a radical prostatectomy in 2001 in [place]. He had salvage radiotherapy in 2002 and had ??been followed with a gradually rising PSA. He was found to have multiple pulmonary nodules, but no bone metastases. Lupron was started in early 2009 with dramatic reduction in his pulmonary disease.His PSA has been rising on ADT, so Casodex (2010, PSA 9.6) was started and subsequently Zytiga (04/2011, for sx C7 lesion without PSA rise). He received XRT C4-T2 2012 before starting Zytiga. He discontinued Zytiga because he had substantial weight gain on Zytiga and Prednisone. He also discontinued Xtandi (started in 05/2012) because of fatigue and rash, joint swelling. In looking back through his notes it looks like his Lupron was stoppped when he started casodex in 2010. Despite the side effects, he had a sustained biochemical response. He fractured a vertebrae falling off of a ladder and now has chronic back pain. Restarted primary ADT with Lupron 22.5 mg in 03/2016. PSA rise; Scans 11/2017 with increased size of 1 pulm nodule and LAN. Started Xtandi end of 11/2017 at a reduced dose of 80 mg in an effort to minimize SE.    Interim History: Mr. Michael Stokes reports 3-4 BM per day which started 3 weeks ago. He tried to take loperamide which slows the diarrhea he said he didn't feel like it helped that much so tried for a few days then stopped. He felt like it decreased his stools from 4 stools to 2 or 3 stools. This is his main complaint today. He has no other side effects to report.    Adherence: Takes 2 tablets every evening with no missed doses.     Toxicities: Hypertenison, diarrhea    Drug-Drug Interactions: none    Medications:  Current Outpatient Medications   Medication Sig Dispense Refill   ??? allopurinol (ZYLOPRIM) 100 MG tablet Take 2 tablets (200 mg total) by mouth daily. 180 tablet 1   ??? amitriptyline (ELAVIL) 25 MG tablet TAKE 1 TABLET(25 MG) BY MOUTH EVERY NIGHT 90 tablet 3   ??? aspirin (ENTERIC COATED ASPIRIN) 81 MG tablet Take 1 tablet (81 mg total) by mouth daily. 100 tablet 11   ??? atorvastatin (LIPITOR) 40 MG tablet Take 1 tablet (40 mg total) by mouth daily. 90 tablet 3   ??? blood sugar diagnostic (FREESTYLE LITE STRIPS) Strp by Other route Two (2) times a day. 100 strip 11   ??? carvedilol (COREG) 25 MG tablet Take 1.5 tablets (37.5 mg total) by mouth Two (2) times a day. 270 tablet 3   ???  cholecalciferol, vitamin D3, 1,000 unit tablet 2000u daily 30 tablet 11   ??? enzalutamide (XTANDI) 40 mg cap capsule Take 2 capsules (80 mg total) by mouth daily. 60 capsule 6   ??? hydroCHLOROthiazide (HYDRODIURIL) 50 MG tablet Take 1 tablet (50 mg total) by mouth daily. 90 tablet 3   ??? insulin ASPART (NOVOLOG FLEXPEN) 100 unit/mL (3 mL) injection pen 8u prior to breakfast and lunch, 10u prior to dinner 15 mL prn   ??? insulin glargine (LANTUS SOLOSTAR U-100 INSULIN) 100 unit/mL (3 mL) injection pen Inject 0.56 mL (56 Units total) under the skin daily. 15 mL 3   ??? losartan (COZAAR) 100 MG tablet Take 1 tablet (100 mg total) by mouth daily. 90 tablet 3   ??? pen needle, diabetic (BD ULTRA-FINE NANO PEN NEEDLE) 32 gauge x 5/32 Ndle BID as directed w/ insulin 200 each 3   ??? venlafaxine (EFFEXOR-XR) 75 MG 24 hr capsule Take 1 capsule (75 mg total) by mouth daily. 90 capsule 3     No current facility-administered medications for this visit.      I spent 10 minutes with Mr.Febo in direct patient care.     Laverna Peace PharmD, BCOP, CPP  Hematology/Oncology Pharmacist  P: [phone]

## 2018-05-20 NOTE — Unmapped (Addendum)
Blood pressure medicine:    Carvedilol: 37.5 mg two times/day (1.5 tabs two times/day)  Hydrochlorathiazide: Increase to 50 mg/day. If you have the 12.5 mg tablets, this would be 4 of those. If you have the 25 mg tablets, it would be be 2.     STOP the cancer medicine for 1 week. I will call you at the end of next week to see how you are doing    Make sure you are eating a gentle diet    Come back in about a month; sooner if needed    Make sure to drink plenty of water      Lab Results   Component Value Date    PSA <0.10 05/20/2018    PSA <0.10 03/18/2018    PSA 0.17 02/15/2018    PSA 1.69 01/14/2018    PSA 18.00 (H) 12/03/2017    PSA 15.90 (H) 11/16/2017           Albertina Parr, NP-C, OCN  Adult Nurse Practitioner  Urology and Medical Oncology    If you have been prescribed a medication today, always read the package insert that comes with the medication.    In the event of an emergency, always call 911    Urology:  Main clinic & [facility]: [phone]  Fax: [phone]    Cancer Hospital:  Phone: [phone]  Fax: [phone]    After hours/nights/weekends:  Hospital Operator: [phone]    [email or web address]  [email or web address]                Patient Education        Diarrhea: Care Instructions  Your Care Instructions    Diarrhea is loose, watery stools (bowel movements). The exact cause is often hard to find. Sometimes diarrhea is your body's way of getting rid of what caused an upset stomach. Viruses, food poisoning, and many medicines can cause diarrhea. Some people get diarrhea in response to emotional stress, anxiety, or certain foods.  Almost everyone has diarrhea now and then. It usually isn't serious, and your stools will return to normal soon. The important thing to do is replace the fluids you have lost, so you can prevent dehydration.  The doctor has checked you carefully, but problems can develop later. If you notice any problems or new symptoms, get medical treatment right away.  Follow-up care is a key part of your treatment and safety. Be sure to make and go to all appointments, and call your doctor if you are having problems. It's also a good idea to know your test results and keep a list of the medicines you take.  How can you care for yourself at home?  ?? Watch for signs of dehydration, which means your body has lost too much water. Dehydration is a serious condition and should be treated right away. Signs of dehydration are:  ? Increasing thirst and dry eyes and mouth.  ? Feeling faint or lightheaded.  ? Darker urine, and a smaller amount of urine than normal.  ?? To prevent dehydration, drink plenty of fluids, enough so that your urine is light yellow or clear like water. Choose water and other caffeine-free clear liquids until you feel better. If you have kidney, heart, or liver disease and have to limit fluids, talk with your doctor before you increase the amount of fluids you drink.  ?? Begin eating small amounts of mild foods the next day, if you feel like it.  ? Try  yogurt that has live cultures of Lactobacillus. (Check the label.)  ? Avoid spicy foods, fruits, alcohol, and caffeine until 48 hours after all symptoms are gone.  ? Avoid chewing gum that contains sorbitol.  ? Avoid dairy products (except for yogurt with Lactobacillus) while you have diarrhea and for 3 days after symptoms are gone.  ?? The doctor may recommend that you take over-the-counter medicine, such as loperamide (Imodium), if you still have diarrhea after 6 hours. Read and follow all instructions on the label. Do not use this medicine if you have bloody diarrhea, a high fever, or other signs of serious illness. Call your doctor if you think you are having a problem with your medicine.  When should you call for help?  Call 911 anytime you think you may need emergency care. For example, call if:  ?? ?? You passed out (lost consciousness).   ?? ?? Your stools are maroon or very bloody.   ??Call your doctor now or seek immediate medical care if:  ?? ?? You are dizzy or lightheaded, or you feel like you may faint.   ?? ?? Your stools are black and look like tar, or they have streaks of blood.   ?? ?? You have new or worse belly pain.   ?? ?? You have symptoms of dehydration, such as:  ? Dry eyes and a dry mouth.  ? Passing only a little dark urine.  ? Feeling thirstier than usual.   ?? ?? You have a new or higher fever.   ??Watch closely for changes in your health, and be sure to contact your doctor if:  ?? ?? Your diarrhea is getting worse.   ?? ?? You see pus in the diarrhea.   ?? ?? You are not getting better after 2 days (48 hours).   Where can you learn more?  Go to [facility] at [email or web address]  Select Preferences in the upper right hand corner, then select Health Library under Resources. Enter [phone] in the search box to learn more about Diarrhea: Care Instructions.  Current as of: August 02, 2017  Content Version: 12.1  ?? 2006-2019 Healthwise, Incorporated. Care instructions adapted under license by [facility] [facility]. If you have questions about a medical condition or this instruction, always ask your healthcare professional. Healthwise, Incorporated disclaims any warranty or liability for your use of this information.

## 2018-05-20 NOTE — Unmapped (Signed)
CC: f/u prostate cancer    A&P:    1.  mCRPC:    ?? Lupron 22.5 mg  due again on or after 08/18/18  ?? Rec daily calcium and vitamin D  ?? Continue good self care  ?? HOLD enza in the setting of diarrhea and HTN x 1 week  ?? I will call him at the end of next week to see how he's doing      2. HTN    ?? Increase carvedilol to 50 mg BID. He can use the 25 mg tabs that he has at home and I can send in a new rx when he needs one  ?? Continue HCTZ and losartan    3. Diarrhea    ?? Hold enza to see if that has been contributing  ?? Will call him at the end of next week to see how he's doing  ?? We can restart at 40 mg dose if diarrhea resolves on holding enza  ?? Review how to take immodium  ?? notidy [place] if abd pain, bleeding, fevers, etc  ?? He was unable to provide a stool sample today  ?? Avoid spicy/greasy/fried foods    RTC 1 month for labs, visit; sooner if needed    Approximately 25 minutes was spent in the care of this patient, half in education and counseling.  A copy of the AVS was provided to the patient which includes key points from our visit as well as our contact information.      Albertina Parr, NP-C  Adult Nurse Practitioner  Urology & Medical Oncology      Onc history:    --Status post a radical prostatectomy in 2001 in [place].   --He had salvage radiotherapy in 2002 and had  been followed with a gradually rising PSA. He was found to have multiple pulmonary nodules, but no bone metastases.   --Lupron was started in early 2009 with dramatic reduction in his pulmonary disease.    --His PSA has been rising on ADT, so Casodex (2010, PSA 9.6) was started and subsequently Zytiga (04/2011, for sx C7 lesion without PSA rise).  --XRT C4-T2 2012 before starting Zytiga  --He discontinued Zytiga because he had substantial weight gain on Zytiga and Prednisone.   --He also discontinued Xtandi (started in 05/2012) because of fatigue and rash, joint swelling.    In looking back through his notes it looks like his Lupron was stoppped when he started casodex in 2010    Despite the side effects, he had a sustained biochemical response. He fractured a vertebrae falling off of a ladder and now has chronic back pain.    --Restarted primary ADT with Lupron 22.5 mg in 03/2016  --PSA rise; Scans 11/2017 with increased size of 1 pulm nodule and LAN  --Started Xtandi end of 11/2017 at a reduced dose of 80 mg in an effort to minimize SE        HISTORY OF PRESENT ILLNESS:     Michael Stokes returns today for scheduled followup.    Today, he reports  -Diarrhea x 3 weeks. ~ 3 loose stools/day. Tried immodium, helped some. No abd pain. Manson Passey. afebrile. Has not taken antibiotics. No sick contacts  -concerned about gradual changes in memory    No LUTS, bone pain, weight loss    BPs at home [id number]/68-82        Allergies   Allergen Reactions   ??? Amlodipine Swelling   ??? Topiramate Swelling   ???  Arb-Angiotensin Receptor Antagonist Other (See Comments)     Hyperkalemia   ??? Zolpidem Other (See Comments)     Hangover     Meds: Updated in EPIC    10 pt ROS reviewed and negative except per HPI           VITAL SIGNS:          Vitals:    05/20/18 1314   BP: 150/82   Pulse:    Resp:    Temp:    SpO2:      GENERAL: Pleasant, NAD.   VS: Reviewed and unremarkable  Head: normocephalic  Eyes: sclera aniecteric  Pulm: Non labored WOB; CTA bil  CV: RRR; no MRG  Abd: Doft, NT, BS x 4, normoactive  Skin: Warm, dry  Msk: No Le edema  NEURO: A&O x 3      Data:    CT CAP: 5/17  IMPRESSION:   1. New retroperitoneal and pelvic adenopathy concerning for metastatic disease.  2. Pulmonary nodules, at least one of which is slightly increased in size, and a few of which are new compared to prior, concerning for metastatic disease.    Bone scan: 5/17  IMPRESSION:   -Grossly unchanged focal uptake in the right aspect of C7. No new osseous lesion identified.    CT CAP 11/2017    Impression     - Increased size of right lower lobe pulmonary nodule.    - Enlarged aortocaval lymph node now measuring 1.7 cm. Decreased size of previously seen retrocaval and external iliac lymph nodes.       Bone scan 11/2017    Impression     Increased focal uptake at C7 compared to most recent bone scan (03/20/2016) is concerning for recurrent disease, though is indeterminate given known history of metastatic lesion at this site.         Lab Results   Component Value Date    PSA <0.10 05/20/2018    PSA <0.10 03/18/2018    PSA 0.17 02/15/2018    PSA 1.69 01/14/2018    PSA 18.00 (H) 12/03/2017    PSA 15.90 (H) 11/16/2017     03/27/2015 = 3.9     PSA Diagnostic   Date Value Ref Range Status   12/25/2014 3.9 0.0 - 4.0 ng/mL Final   10/20/2014 1.7 0.0 - 4.0 ng/mL Final   09/18/2014 2.1 0.0 - 4.0 ng/mL Final   06/12/2014 1.3 0.0 - 4.0 ng/mL Final   03/09/2014 0.5 0.0 - 4.0 ng/mL Final   12/08/2013 0.2 0.0 - 4.0 ng/mL Final   09/08/2013 <0.1 0.0 - 4.0 ng/mL Final   06/09/2013 <0.1 0.0 - 4.0 ng/mL Final   03/03/2013 <0.1 0.0 - 4.0 ng/mL Final   12/02/2012 <0.1 0.0 - 4.0 NG/ML Final   08/26/2012 <0.1 0.0 - 4.0 NG/ML Final   05/28/2012 <0.1 0.0 - 4.0 NG/ML Final   02/26/2012 <0.1 0.0 - 4.0 NG/ML Final   12/04/2011 <0.1 0.0 - 4.0 NG/ML Final   07/24/2011 1.4 0.0 - 4.0 NG/ML Final   04/17/2011 3.0 0.0 - 4.0 NG/ML Final   01/09/2011 3.1 0.0 - 4.0 NG/ML Final     2010-10-10 PSA, DIAGNOSTIC 2.3 NG/ML 0.0-4.0    2010-06-20 PSA, DIAGNOSTIC 1.2 NG/ML 0.0-4.0    2010-01-24 PSA, DIAGNOSTIC 1.1 NG/ML 0.0-4.0           His PSA increased previously to 9.6 . In 04/26/09 it was 3.7, 01/18/09 it was 1.2.

## 2018-05-20 NOTE — Unmapped (Signed)
1048:  Labs drawn and sent for analysis.  Care provided by  Townsend Roger, RN

## 2018-05-26 DIAGNOSIS — I10 Essential (primary) hypertension: Secondary | ICD-10-CM | POA: Diagnosis not present

## 2018-05-26 DIAGNOSIS — E11649 Type 2 diabetes mellitus with hypoglycemia without coma: Secondary | ICD-10-CM | POA: Diagnosis not present

## 2018-05-26 MED ORDER — INSULIN GLARGINE (U-100) 100 UNIT/ML (3 ML) SUBCUTANEOUS PEN
Freq: Every day | SUBCUTANEOUS | 3 refills | 0 days | Status: CP
Start: 2018-05-26 — End: 2018-07-28

## 2018-05-26 MED ORDER — INSULIN ASPART (U-100) 100 UNIT/ML (3 ML) SUBCUTANEOUS PEN
prn refills | 0 days | Status: CP
Start: 2018-05-26 — End: 2018-07-28

## 2018-05-26 NOTE — Unmapped (Addendum)
Chronic Care Management Note    Michael Stokes is a 83 y.o. male    PCP Action Items: none    Care Manager Action Item: follow-up in about 2 weeks    Review last clinic note/s with patient.   Diabetes - visit with Julieanne Cotton, PA  Continue Lantus 56u nightly, Novolog 8u prior to bkfst and lunch.  Increase to 10u prior to dinner.  Continue ASA, statin, ARB.  Foot exam and retinal pics today in clinic.  Reassess a1c 3 mo    HTN - visit with Carlyon Shadow Dunn, ANP  Increase carvedilol to 50 mg two times/day - 2 tablets in the morning and 2 tablets in the evening. When you run out, we will send a prescription for the higher dose. Continue HCTZ and losrtan.    Diabetes:    Lab Results   Component Value Date    A1C 8.0 (H) 04/06/2018      The Diabetes Enhanced Care Team spoke to Michael Stokes to follow up on his recent visit with [facility] on 04/06/18.    Pt reports a diabetes regimen of:  - Lantus 56 U at night  - Novolog; 8 U before breakfast and lunch, 9 U before dinner    Missed doses? no  Hypos? no    Pt reports blood sugars of:  Date Before breakfast Before dinner   7/17 79    7/16 101 149   7/13  114   7/10 115    7/7  89   7/6 85      24-Hour Diet Recall:     Breakfast: shredded wheat  Lunch: small sandwich, diet tea, water  Dinner: sweet potato, BBQ ribs    Recommended Dietitian Visit? no    Hypertension:    Patient reports a regimen of:  - Carvedilol 1.5 tablets twice a day  - HCTZ 50 mg once a day  - Losartan 100 mg once a day    CA informed patient of plan by Barkley Bruns, ANP to increase his carvedilol to 2 tablets twice a day. Patient expressed understanding and will check MyChart for the message from provider.    Pt reports blood pressures of:  Date BP Heart Rate   7/13 135/82 68   7/7 136/70 70   6/29 123/68 79       Follow Up:    Any recent visits to outside providers? (Urgent Care, ER, Hospital)? no  Need any refills?  Yes, patient needs refills for lantus and novolog. Refills have been sent.    Need any medical equipment?  no    Is the patient due for Healthcare Maintenance?  no  -- If so, please ask to schedule an appointment for maintenance.    Does the patient need an appointment to be seen in clinic? no  - Upcoming appointment 07/07/18 with [facility]    Any outstanding orders that need to be addressed? no  Does patient have transportation to upcoming appointments? yes  Are there any other barriers this patient faces to receiving adequate medical care? no      Patient plan of care was reviewed. Any changes/updates needed were addressed.    The patient was discussed with Julieanne Cotton, PA and the following changes to his/her medications were made:  - Continue lantus 56 U nightly  - Continue novolog 8 U before breakfast and lunch, 9 U before dinner.     All medical decisions were made by  Scot Jun Dale, [place].

## 2018-05-28 NOTE — Unmapped (Signed)
Called to check up on Mr. Gabay    Diarrhea  -Took 3 loperamide total on Monday  -no BM for 24 hours after that  -Wednesday 1 BM, not as loose  -This morning, very loose, so he restarted loperamide    HTN  -135/82, HR 68 on 7/13  -120-80, HR 80 on 7/19    He has been off enzalutamide for 1 week    Plan:  1. Continue PRN loperamide  2. Continue anti-HTN regimen  3. Restart enzalutamide at 40 mg/day  4. Check and record BP daily x 1 week  5. I will call him toward the end of next week to see how he's doing  6. Would like to try to obtain stool sample, as he was unable to do this in clinic last week    We spoke for about 10 minutes    Albertina Parr, NP-C  Adult Nurse Practitioner  Urology & Medical Oncology

## 2018-06-02 NOTE — Unmapped (Signed)
I was the supervising provider for the service delivered.    Michael Stokes

## 2018-06-03 NOTE — Unmapped (Signed)
I was the supervising provider for the service delivered.    Jillene Bucks

## 2018-06-09 NOTE — Unmapped (Signed)
Pt says his Xtandi dose was reduced from 2 capsules daily to 1 capsule daily due to diarrhea and he still has periodic episides.  He has plenty of Xtandi due to the reduced dose.  We will call him again in 2 weeks to schedule the next refill.

## 2018-06-10 DIAGNOSIS — E119 Type 2 diabetes mellitus without complications: Secondary | ICD-10-CM | POA: Diagnosis not present

## 2018-06-15 DIAGNOSIS — E11649 Type 2 diabetes mellitus with hypoglycemia without coma: Secondary | ICD-10-CM | POA: Diagnosis not present

## 2018-06-15 DIAGNOSIS — I10 Essential (primary) hypertension: Secondary | ICD-10-CM | POA: Diagnosis not present

## 2018-06-15 NOTE — Unmapped (Signed)
called to check in re: diarrhea and BP  Left vm to return my call  mDunn

## 2018-06-15 NOTE — Unmapped (Signed)
The Internal Medicine Enhanced Care team was unable to reach Michael Stokes to follow-up in regards to the Chronic Care Management Program. Household member picked up phone and reported that he was not available at the time. CA asked to call back later in the week. This is our first attempt to contact patient.    CA planned to discuss:  - DM2: lantus, novolog  - HTN: carvedilol, HCTZ, losartan

## 2018-06-18 NOTE — Unmapped (Signed)
Pt continued Xtandi at 1 cap daily and still has a good bit of medication on hand. Asked that we reschedule the call for next week. Reaching out to provider now to get new RX in with correct directions.     He reported slight improvement in the diarrhea with the decreased dose and has a follow up with his physician around the 18th.

## 2018-06-21 MED ORDER — ENZALUTAMIDE 40 MG CAPSULE
ORAL_CAPSULE | Freq: Every day | ORAL | 5 refills | 0 days | Status: CP
Start: 2018-06-21 — End: 2018-07-06

## 2018-06-21 NOTE — Unmapped (Addendum)
Per chart review, dose was restarted at 40 mg PO daily.  New Rx with dose change submitted.    Lucia Gaskins RN MSN  Per diem NN

## 2018-06-21 NOTE — Unmapped (Signed)
Addended by: Lucia Gaskins A on: 06/21/2018 09:58 AM     Modules accepted: Orders

## 2018-06-23 MED ORDER — ALLOPURINOL 100 MG TABLET
ORAL_TABLET | Freq: Every day | ORAL | 3 refills | 0.00000 days | Status: CP
Start: 2018-06-23 — End: 2018-08-27

## 2018-06-23 NOTE — Unmapped (Signed)
Chronic Care Management Note    Michael Stokes is a 83 y.o. male    PCP Action Items: N/A    Care Manager Action Item: N/A    Review last clinic note/s with patient.     Diabetes:    Lab Results   Component Value Date    A1C 8.0 (H) 04/06/2018         The Diabetes Enhanced Care Team spoke to [facility] to follow up on diabetes and hypertension.    Pt reports a diabetes regimen of:  - Lantus 56 U nightly  - Novolog 8 U before breakfast and lunch, 9 U before dinner    Missed doses? no  Hypos? Yes. Once - patient hadn't eaten lunch and experienced some symptoms.     Pt reports blood sugars of:  Date Before breakfast Before lunch After lunch Before dinner   8/14 124      8/13    93   8/12   109    8/9 148      8/4 159      7/18  197       24-Hour Diet Recall:     Breakfast: 2 small frozen waffles with light syrup  Lunch: small baked sweet potato with light butter, asparagus  Dinner: 2 slices of pork, veggies (broccoli, carrots, cauliflower)  Snacks: none  Drinks: water    Recommended Dietitian Visit? no    Exercise: yard work, Dealer    Hypertension:    Patient is currently taking carvedilol 50 mg daily, HCTZ 50 mg daily, and losartan 100 mg daily.     Follow Up:    Any recent visits to outside providers? (Urgent Care, ER, Hospital)? no  Need any refills?  Yes. Patient needs refill of allopurinol.     Need any medical equipment?  no    Is the patient due for Healthcare Maintenance?  no  -- If so, please ask to schedule an appointment for maintenance.    Does the patient need an appointment to be seen in clinic? no  Any outstanding orders that need to be addressed? no  Does patient have transportation to upcoming appointments? yes  Are there any other barriers this patient faces to receiving adequate medical care? no    Patient plan of care was reviewed. Any changes/updates needed were addressed.    The patient was discussed with Arby Barrette, PA and the following changes to his/her medications were made:  - Continue current regimen  - Check pre-prandial blood sugars    Patient expressed understanding and will bring meter/recordings with him to his upcoming visit.    All medical decisions were made by Arby Barrette, PA.

## 2018-06-23 NOTE — Unmapped (Signed)
The Internal Medicine [facility] team was unable to reach Michael Stokes to follow-up in regards to the Chronic Care Management Program. A voicemail was left with a request to return our call. This is our second attempt to contact patient.    CA planned to discuss:  - DM2: lantus, novolog  - HTN: carvedilol, HCTZ, losartan

## 2018-07-01 ENCOUNTER — Other Ambulatory Visit: Admit: 2018-07-01 | Discharge: 2018-07-02 | Payer: PRIVATE HEALTH INSURANCE

## 2018-07-01 ENCOUNTER — Ambulatory Visit
Admit: 2018-07-01 | Discharge: 2018-07-02 | Payer: PRIVATE HEALTH INSURANCE | Attending: Nurse Practitioner | Primary: Nurse Practitioner

## 2018-07-01 DIAGNOSIS — C7800 Secondary malignant neoplasm of unspecified lung: Secondary | ICD-10-CM | POA: Diagnosis not present

## 2018-07-01 DIAGNOSIS — I10 Essential (primary) hypertension: Secondary | ICD-10-CM | POA: Diagnosis not present

## 2018-07-01 DIAGNOSIS — C61 Malignant neoplasm of prostate: Secondary | ICD-10-CM | POA: Diagnosis not present

## 2018-07-01 DIAGNOSIS — R197 Diarrhea, unspecified: Secondary | ICD-10-CM | POA: Diagnosis not present

## 2018-07-01 DIAGNOSIS — M545 Low back pain: Secondary | ICD-10-CM | POA: Diagnosis not present

## 2018-07-01 LAB — CBC W/ AUTO DIFF
BASOPHILS ABSOLUTE COUNT: 0.1 10*9/L (ref 0.0–0.1)
BASOPHILS RELATIVE PERCENT: 0.8 %
EOSINOPHILS ABSOLUTE COUNT: 0.2 10*9/L (ref 0.0–0.4)
EOSINOPHILS RELATIVE PERCENT: 2.7 %
HEMATOCRIT: 34.8 % — ABNORMAL LOW (ref 41.0–53.0)
HEMOGLOBIN: 11.6 g/dL — ABNORMAL LOW (ref 13.5–17.5)
LARGE UNSTAINED CELLS: 3 % (ref 0–4)
LYMPHOCYTES ABSOLUTE COUNT: 1.6 10*9/L (ref 1.5–5.0)
MEAN CORPUSCULAR HEMOGLOBIN CONC: 33.3 g/dL (ref 31.0–37.0)
MEAN CORPUSCULAR HEMOGLOBIN: 30.8 pg (ref 26.0–34.0)
MEAN PLATELET VOLUME: 7.7 fL (ref 7.0–10.0)
MONOCYTES ABSOLUTE COUNT: 0.4 10*9/L (ref 0.2–0.8)
MONOCYTES RELATIVE PERCENT: 5.5 %
NEUTROPHILS ABSOLUTE COUNT: 4.2 10*9/L (ref 2.0–7.5)
NEUTROPHILS RELATIVE PERCENT: 64.6 %
PLATELET COUNT: 200 10*9/L (ref 150–440)
RED BLOOD CELL COUNT: 3.76 10*12/L — ABNORMAL LOW (ref 4.50–5.90)
RED CELL DISTRIBUTION WIDTH: 15.4 % — ABNORMAL HIGH (ref 12.0–15.0)
WBC ADJUSTED: 6.5 10*9/L (ref 4.5–11.0)

## 2018-07-01 LAB — COMPREHENSIVE METABOLIC PANEL
ALBUMIN: 3.7 g/dL (ref 3.5–5.0)
ALKALINE PHOSPHATASE: 138 U/L — ABNORMAL HIGH (ref 38–126)
ALT (SGPT): 20 U/L (ref 19–72)
ANION GAP: 11 mmol/L (ref 9–15)
AST (SGOT): 22 U/L (ref 19–55)
BILIRUBIN TOTAL: 0.6 mg/dL (ref 0.0–1.2)
BLOOD UREA NITROGEN: 49 mg/dL — ABNORMAL HIGH (ref 7–21)
BUN / CREAT RATIO: 21
CALCIUM: 9.5 mg/dL (ref 8.5–10.2)
CHLORIDE: 101 mmol/L (ref 98–107)
CO2: 25 mmol/L (ref 22.0–30.0)
CREATININE: 2.36 mg/dL — ABNORMAL HIGH (ref 0.70–1.30)
EGFR CKD-EPI AA MALE: 29 mL/min/{1.73_m2} — ABNORMAL LOW (ref >=60)
GLUCOSE RANDOM: 223 mg/dL — ABNORMAL HIGH (ref 65–179)
POTASSIUM: 3.8 mmol/L (ref 3.5–5.0)
PROTEIN TOTAL: 6.1 g/dL — ABNORMAL LOW (ref 6.5–8.3)
SODIUM: 137 mmol/L (ref 135–145)

## 2018-07-01 LAB — PROSTATE SPECIFIC ANTIGEN: Prostate specific Ag:MCnc:Pt:Ser/Plas:Qn:: <0.1

## 2018-07-01 LAB — EGFR CKD-EPI NON-AA MALE: Lab: 25 — ABNORMAL LOW

## 2018-07-01 LAB — MONOCYTES ABSOLUTE COUNT: Lab: 0.4

## 2018-07-01 MED ORDER — CARVEDILOL 25 MG TABLET
ORAL_TABLET | Freq: Two times a day (BID) | ORAL | 11 refills | 0 days | Status: CP
Start: 2018-07-01 — End: 2018-11-24

## 2018-07-01 NOTE — Unmapped (Addendum)
Lab Results   Component Value Date    PSA <0.10 07/01/2018    PSA <0.10 05/20/2018    PSA <0.10 03/18/2018    PSA 0.17 02/15/2018    PSA 1.69 01/14/2018    PSA 18.00 (H) 12/03/2017       Blood pressure medicine:    Carvedilol: 50 mg two times/day (2 tabs two times/day)  Hydrochlorathiazide: 50 mg/day  Losart[place]: 100 mg/day    Continue the [employer] (1 pill/day)    I sent a referral to [facility] at [facility] re: diarrhea    In 2 weeks get kidney number checked at [facility] back to see me in October when you are due for your shot    Make sure you are eating a gentle diet      Make sure to drink plenty of water              Albertina Parr, NP-C, OCN  Adult Nurse Practitioner  Urology and Medical Oncology    If you have been prescribed a medication today, always read the package insert that comes with the medication.    In the event of an emergency, always call 911    Urology:  Main clinic & [facility]: [phone]  Fax: [phone]    Cancer Hospital:  Phone: [phone]  Fax: [phone]    After hours/nights/weekends:  Hospital Operator: [phone]    [email or web address]  [email or web address]                Patient Education        Diarrhea: Care Instructions  Your Care Instructions    Diarrhea is loose, watery stools (bowel movements). The exact cause is often hard to find. Sometimes diarrhea is your body's way of getting rid of what caused an upset stomach. Viruses, food poisoning, and many medicines can cause diarrhea. Some people get diarrhea in response to emotional stress, anxiety, or certain foods.  Almost everyone has diarrhea now and then. It usually isn't serious, and your stools will return to normal soon. The important thing to do is replace the fluids you have lost, so you can prevent dehydration.  The doctor has checked you carefully, but problems can develop later. If you notice any problems or new symptoms, get medical treatment right away.  Follow-up care is a key part of your treatment and safety. Be sure to make and go to all appointments, and call your doctor if you are having problems. It's also a good idea to know your test results and keep a list of the medicines you take.  How can you care for yourself at home?  ?? Watch for signs of dehydration, which means your body has lost too much water. Dehydration is a serious condition and should be treated right away. Signs of dehydration are:  ? Increasing thirst and dry eyes and mouth.  ? Feeling faint or lightheaded.  ? Darker urine, and a smaller amount of urine than normal.  ?? To prevent dehydration, drink plenty of fluids, enough so that your urine is light yellow or clear like water. Choose water and other caffeine-free clear liquids until you feel better. If you have kidney, heart, or liver disease and have to limit fluids, talk with your doctor before you increase the amount of fluids you drink.  ?? Begin eating small amounts of mild foods the next day, if you feel like it.  ? Try yogurt that has live cultures  of Lactobacillus. (Check the label.)  ? Avoid spicy foods, fruits, alcohol, and caffeine until 48 hours after all symptoms are gone.  ? Avoid chewing gum that contains sorbitol.  ? Avoid dairy products (except for yogurt with Lactobacillus) while you have diarrhea and for 3 days after symptoms are gone.  ?? The doctor may recommend that you take over-the-counter medicine, such as loperamide (Imodium), if you still have diarrhea after 6 hours. Read and follow all instructions on the label. Do not use this medicine if you have bloody diarrhea, a high fever, or other signs of serious illness. Call your doctor if you think you are having a problem with your medicine.  When should you call for help?  Call 911 anytime you think you may need emergency care. For example, call if:  ?? ?? You passed out (lost consciousness).   ?? ?? Your stools are maroon or very bloody.   ??Call your doctor now or seek immediate medical care if:  ?? ?? You are dizzy or lightheaded, or you feel like you may faint.   ?? ?? Your stools are black and look like tar, or they have streaks of blood.   ?? ?? You have new or worse belly pain.   ?? ?? You have symptoms of dehydration, such as:  ? Dry eyes and a dry mouth.  ? Passing only a little dark urine.  ? Feeling thirstier than usual.   ?? ?? You have a new or higher fever.   ??Watch closely for changes in your health, and be sure to contact your doctor if:  ?? ?? Your diarrhea is getting worse.   ?? ?? You see pus in the diarrhea.   ?? ?? You are not getting better after 2 days (48 hours).   Where can you learn more?  Go to [facility] at [email or web address]  Select Preferences in the upper right hand corner, then select Health Library under Resources. Enter [phone] in the search box to learn more about Diarrhea: Care Instructions.  Current as of: August 02, 2017  Content Version: 12.1  ?? 2006-2019 Healthwise, Incorporated. Care instructions adapted under license by [facility] If you have questions about a medical condition or this instruction, always ask your healthcare professional. Healthwise, Incorporated disclaims any warranty or liability for your use of this information.

## 2018-07-01 NOTE — Unmapped (Signed)
0913:  Labs drawn and sent for analysis.  Care provided by  Blythe Stanford, RN

## 2018-07-01 NOTE — Unmapped (Addendum)
CC: f/u prostate cancer    A&P:    1.  mCRPC:    ?? Lupron 22.5 mg due again on or after 08/18/18  ?? Rec daily calcium and vitamin D  ?? Continue good self care  ?? Continue enzalutamide 40 mg daily (one tab/day)      2. HTN    ?? Continue carvedilol to 50 mg BID.   ?? Continue HCTZ and losartan  ?? Maintain blood pressure log at home    3. Diarrhea    ?? Referral to GI med at [facility]  ?? Trial off enza and reduced dose has not really helped  ?? notidy [place] if abd pain, bleeding, fevers, etc  ?? He was unable to provide a stool sample today  ?? Avoid spicy/greasy/fried foods      4. SCr rise    ?? Recheck at [facility] lab in 1-2 weeks  ?? Drink more water    5. Low back pain    ?? S/p fall (and previous h/o trauma)  ?? Continue supportive measures      Labs at [facility] in 1-2 weeks  Labs, visit with me, Lupron early October    Approximately 25 minutes was spent in the care of this patient, half in education and counseling.  A copy of the AVS was provided to the patient which includes key points from our visit as well as our contact information.      Albertina Parr, NP-C  Adult Nurse Practitioner  Urology & Medical Oncology      Onc history:    --Status post a radical prostatectomy in 2001 in [place].   --He had salvage radiotherapy in 2002 and had  been followed with a gradually rising PSA. He was found to have multiple pulmonary nodules, but no bone metastases.   --Lupron was started in early 2009 with dramatic reduction in his pulmonary disease.    --His PSA has been rising on ADT, so Casodex (2010, PSA 9.6) was started and subsequently Zytiga (04/2011, for sx C7 lesion without PSA rise).  --XRT C4-T2 2012 before starting Zytiga  --He discontinued Zytiga because he had substantial weight gain on Zytiga and Prednisone.   --He also discontinued Xtandi (started in 05/2012) because of fatigue and rash, joint swelling.    In looking back through his notes it looks like his Lupron was stoppped when he started casodex in 2010    Despite the side effects, he had a sustained biochemical response. He fractured a vertebrae falling off of a ladder and now has chronic back pain.    --Restarted primary ADT with Lupron 22.5 mg in 03/2016  --PSA rise; Scans 11/2017 with increased size of 1 pulm nodule and LAN  --Started Xtandi end of 11/2017 at a reduced dose of 80 mg in an effort to minimize SE        HISTORY OF PRESENT ILLNESS:     Michael Stokes returns today for scheduled followup.    Today, he reports  -continues to have diarrhea, but says it is somewhat better. It's not completely liquid. He stopped taking the immodium because he is worried abotu constipation. We stopped the enza for about a week and it didn't help the diarrhea   No abd pain. Manson Passey. afebrile. Has not taken antibiotics. No sick contacts    -he fell after his last visit with me. Fell onto his buttocks and he has been having some midline low back pain. tylneol helps. Has a previous trauma history  where he fell off a ladder. No LE weakness, numbness or tingling       No LUTS, bone pain, weight loss    BPs at home [id number]    Manual today: 144/72        Allergies   Allergen Reactions   ??? Amlodipine Swelling   ??? Topiramate Swelling   ??? Arb-Angiotensin Receptor Antagonist Other (See Comments)     Hyperkalemia   ??? Zolpidem Other (See Comments)     Hangover     Meds: Updated in EPIC    10 pt ROS reviewed and negative except per HPI           VITAL SIGNS:          Vitals:    07/01/18 1204   BP: 144/72   Pulse:    Resp:    Temp:    SpO2:      GENERAL: Pleasant, NAD.   VS: Reviewed and unremarkable  Head: normocephalic  Eyes: sclera aniecteric  Pulm: Non labored WOB; CTA bil  CV: RRR; no MRG  Abd: Doft, NT, BS x 4, normoactive  Skin: Warm, dry  Msk: No Le edema  NEURO: A&O x 3      Data:    CT CAP: 5/17  IMPRESSION:   1. New retroperitoneal and pelvic adenopathy concerning for metastatic disease.  2. Pulmonary nodules, at least one of which is slightly increased in size, and a few of which are new compared to prior, concerning for metastatic disease.    Bone scan: 5/17  IMPRESSION:   -Grossly unchanged focal uptake in the right aspect of C7. No new osseous lesion identified.    CT CAP 11/2017    Impression     - Increased size of right lower lobe pulmonary nodule.    - Enlarged aortocaval lymph node now measuring 1.7 cm. Decreased size of previously seen retrocaval and external iliac lymph nodes.       Bone scan 11/2017    Impression     Increased focal uptake at C7 compared to most recent bone scan (03/20/2016) is concerning for recurrent disease, though is indeterminate given known history of metastatic lesion at this site.         Lab Results   Component Value Date    PSA <0.10 07/01/2018    PSA <0.10 05/20/2018    PSA <0.10 03/18/2018    PSA 0.17 02/15/2018    PSA 1.69 01/14/2018    PSA 18.00 (H) 12/03/2017     03/27/2015 = 3.9     PSA Diagnostic   Date Value Ref Range Status   12/25/2014 3.9 0.0 - 4.0 ng/mL Final   10/20/2014 1.7 0.0 - 4.0 ng/mL Final   09/18/2014 2.1 0.0 - 4.0 ng/mL Final   06/12/2014 1.3 0.0 - 4.0 ng/mL Final   03/09/2014 0.5 0.0 - 4.0 ng/mL Final   12/08/2013 0.2 0.0 - 4.0 ng/mL Final   09/08/2013 <0.1 0.0 - 4.0 ng/mL Final   06/09/2013 <0.1 0.0 - 4.0 ng/mL Final   03/03/2013 <0.1 0.0 - 4.0 ng/mL Final   12/02/2012 <0.1 0.0 - 4.0 NG/ML Final   08/26/2012 <0.1 0.0 - 4.0 NG/ML Final   05/28/2012 <0.1 0.0 - 4.0 NG/ML Final   02/26/2012 <0.1 0.0 - 4.0 NG/ML Final   12/04/2011 <0.1 0.0 - 4.0 NG/ML Final   07/24/2011 1.4 0.0 - 4.0 NG/ML Final   04/17/2011 3.0 0.0 - 4.0 NG/ML Final   01/09/2011 3.1 0.0 - 4.0  NG/ML Final     2010-10-10 PSA, DIAGNOSTIC 2.3 NG/ML 0.0-4.0    2010-06-20 PSA, DIAGNOSTIC 1.2 NG/ML 0.0-4.0    2010-01-24 PSA, DIAGNOSTIC 1.1 NG/ML 0.0-4.0           His PSA increased previously to 9.6 . In 04/26/09 it was 3.7, 01/18/09 it was 1.2.

## 2018-07-06 MED ORDER — ENZALUTAMIDE 40 MG CAPSULE
ORAL_CAPSULE | Freq: Every day | ORAL | 0 refills | 0.00000 days | Status: CP
Start: 2018-07-06 — End: 2018-10-05
  Filled 2018-07-07: qty 90, 90d supply, fill #0

## 2018-07-07 ENCOUNTER — Ambulatory Visit: Admit: 2018-07-07 | Discharge: 2018-07-08 | Payer: PRIVATE HEALTH INSURANCE

## 2018-07-07 DIAGNOSIS — I10 Essential (primary) hypertension: Secondary | ICD-10-CM | POA: Diagnosis not present

## 2018-07-07 DIAGNOSIS — E118 Type 2 diabetes mellitus with unspecified complications: Secondary | ICD-10-CM | POA: Diagnosis not present

## 2018-07-07 DIAGNOSIS — Z794 Long term (current) use of insulin: Secondary | ICD-10-CM | POA: Diagnosis not present

## 2018-07-07 DIAGNOSIS — E119 Type 2 diabetes mellitus without complications: Secondary | ICD-10-CM | POA: Diagnosis not present

## 2018-07-07 DIAGNOSIS — H409 Unspecified glaucoma: Secondary | ICD-10-CM | POA: Diagnosis not present

## 2018-07-07 LAB — ALBUMIN / CREATININE URINE RATIO: CREATININE, URINE: 123.6 mg/dL

## 2018-07-07 LAB — CREATININE, URINE: Lab: 123.6

## 2018-07-07 MED ORDER — VITAMINS  A,C,E-ZINC-COPPER 7,160 UNIT-113 MG-100 UNIT TABLET
0 refills | 0 days
Start: 2018-07-07 — End: ?

## 2018-07-07 MED FILL — XTANDI 40 MG CAPSULE: 90 days supply | Qty: 90 | Fill #0 | Status: AC

## 2018-07-07 NOTE — Unmapped (Addendum)
Assessment/Plan:     Lab Results   Component Value Date    A1C 5.7 07/07/2018    A1C 8.0 (H) 04/06/2018    A1C 9.0 (H) 01/07/2018       1. DM2 - controlled, congratulated on changes.  Continue Lantus 56u nightly, Novolog 8,8,9.  Continue current regimen for now, reassess 3 mo instead of 6 to assess for lows, consider a1c at that time as well.  Continue ASA, statin, ARB.  Eye UTD, urine micro today  2. htn - controlled, continue.  Of note, SCr 1.4-2.3 over the past few years, consider PCP f/u or nephrology referral  3. Glaucoma - reports recent evidence of glaucoma, started Preservision recently, followed by [facility]    **ACR unremarkable    F/u: 3 mo, consider a1c    Subject/Objective:     Chief Complaint   Patient presents with   ??? Follow-up     S: 83 y.o. year old male with PMHx significant for   Past Medical History:   Diagnosis Date   ??? Cancer with pulmonary metastases (CMS-HCC)    ??? Chronic kidney disease    ??? Depression    ??? Diabetes Mellitus, uncontrolled 06/06/2009   ??? GERD (gastroesophageal reflux disease)    ??? Hypertension    ??? Hypertension 06/06/2009   ??? Peripheral neuropathy    .     Patient Active Problem List   Diagnosis   ??? Diabetes   ??? Hypertension   ??? Malignant neoplasm of prostate (CMS-HCC)   ??? Polyneuropathy in diabetes (CMS-HCC)   ??? Hypercholesterolemia   ??? Cancer with pulmonary metastases (CMS-HCC)   ??? Dupuytren's contracture of both hands   ??? Rotator cuff tear   ??? Kyphosis, acquired   ??? Durable power of attorney for healthcare exists but copy not available   ??? CKD stage 3 due to type 2 diabetes mellitus (CMS-HCC)   ??? Gout   ??? Enrolled in chronic care management       Presents for DM f/u.  Since last visit, has cut out mostly all sugar drinks and is reducing some other carbs.  Novolog has been reduced from 10u evening meal to 9u since last visit.  Had 1 low of 66 before lunch one day when he ate a late lunch, otherwise unappreciable lows per review of home log        Your Medication List Accurate as of July 07, 2018  9:37 AM. If you have any questions, ask your nurse or doctor.               START taking these medications    vitamins  A,C,E-zinc-copper 7,160-113-100 unit-mg-unit tablet  Commonly known as:  OCUVITE PRESERVISION  1 tab BID  Started by:  Dwana Melena, PA        CONTINUE taking these medications    allopurinol 100 MG tablet  Commonly known as:  ZYLOPRIM  Take 2 tablets (200 mg total) by mouth daily.     amitriptyline 25 MG tablet  Commonly known as:  ELAVIL  TAKE 1 TABLET(25 MG) BY MOUTH EVERY NIGHT     aspirin 81 MG tablet  Commonly known as:  ECOTRIN  Take 1 tablet (81 mg total) by mouth daily.     atorvastatin 40 MG tablet  Commonly known as:  LIPITOR  Take 1 tablet (40 mg total) by mouth daily.     blood sugar diagnostic Strp  by Other route Two (2) times a day.  carvedilol 25 MG tablet  Commonly known as:  COREG  Take 2 tablets (50 mg total) by mouth Two (2) times a day.     cholecalciferol (vitamin D3) 1,000 unit (25 mcg) tablet  2000u daily     enzalutamide 40 mg capsule  Commonly known as:  XTANDI  Take 1 capsule (40 mg total) by mouth daily.     hydroCHLOROthiazide 50 MG tablet  Commonly known as:  HYDRODIURIL  Take 1 tablet (50 mg total) by mouth daily.     insulin ASPART 100 unit/mL (3 mL) injection pen  Commonly known as:  NovoLOG FLEXPEN  8u prior to breakfast and lunch, 9u prior to dinner     insulin glargine 100 unit/mL (3 mL) injection pen  Commonly known as:  BASAGLAR, LANTUS  Inject 0.56 mL (56 Units total) under the skin daily.     losartan 100 MG tablet  Commonly known as:  COZAAR  Take 1 tablet (100 mg total) by mouth daily.     pen needle, diabetic 32 gauge x 5/32 Ndle  BID as directed w/ insulin     venlafaxine 75 MG 24 hr capsule  Commonly known as:  EFFEXOR-XR  Take 1 capsule (75 mg total) by mouth daily.            Allergies   Allergen Reactions   ??? Amlodipine Swelling   ??? Topiramate Swelling   ??? Arb-Angiotensin Receptor Antagonist Other (See Comments)     Hyperkalemia   ??? Zolpidem Other (See Comments)     Hangover        ROS: negative for vision changes, CP, abdominal pain, bowel/bladder changes, bleeding, lower extremity edema    O:  Vital Signs:    Vitals:    07/07/18 0910   BP: 119/58   Pulse: 76   Resp: 16   Temp: 36.9 ??C (98.4 ??F)   TempSrc: Oral   SpO2: 95%   Weight: 94.2 kg (207 lb 9.6 oz)   Height: 172.7 cm (5' 8)       Physical Exam  General Appearance:    Alert, cooperative, no distress, appears stated age   Head:    Normocephalic, without obvious abnormality, atraumatic   Eyes:     Ears:     Throat:   Lips, mucosa, and tongue normal   Neck:   Supple, symmetrical, trachea midline,      No JVD bilaterally.   Lungs:     Clear to auscultation bilaterally, respirations unlabored   Chest Wall:    No tenderness or deformity    Heart:    Regular rate and rhythm, S1 and S2 normal, no murmur, rub    or gallop   Abdomen:     Soft, non-tender       Extremities:   Extremities normal, atraumatic, no cyanosis or edema   Psych :  No agitation, anxiety, or depressed mood.

## 2018-07-13 NOTE — Unmapped (Addendum)
[facility] CONSULTATION VISIT    REFERRING PROVIDER: Chancy Hurter, ANP  [address]  Surgery  [id number] [facility]  [place], [place] [address]    PRIMARY CARE PROVIDER: [name], MD    PATIENT PROFILE: Michael Stokes is a 83 y.o. male (DOB: 03/23/1935) who is seen in consultation at the request of Dr. Shea Evans for evaluation of chronic diarrhea.      ASSESSMENT AND PLAN:     83 y.o. male with PMHx of metastatic prostate cancer, HTN, DMT2, CKD and GERD who is being seen in consultation for chronic diarrhea.  Etiology of patient's change in bowel frequency and urgency includes infectious vs inflammatory vs autoimmune vs functional.  He does not have any other symptoms of infection, has a normal WBC and is otherwise stable which makes infectious etiology less likely, especially given the time course of 2 months. However, will rule out with stool studies.  Medications remain a possible source, however his stool frequency did not change with holding his enzalutimide.  Additionally, he has been on stable doses of the rest of his medications and none of them commonly cause diarrhea.  He may have an inflammatory colitis, especially microscopic colitis, as SSRIs or any other medications that can cause chronic diarrhea have been associated with microscopic colitis.  Regardless, would rule out infectious causes and continue with symptomatic management at this time.    Plan:  - check fecal calprotectin, C diff, GIPP  - check ESR, CRP, TSH  - continue to use imodium as needed   - take 2 mg twice a day for the next two week; advised to slowly titrate down dose to goal of 2-3 well-formed bowel movements  - start psyllium (over the counter) daily to help bulk up stool  - If patient symptoms do not improve with symptomatic management, can discuss doing a colonoscopy in the future to evaluate for microscopic colitis.  This colonoscopy can also serve as CRC screening and his last colonoscopy was in 2007.  Despite his age, he is very functional and may still benefit from [facility] screening.       Patient was seen and discussed with Dr. Maricela Bo who is in agreement with above assessment and plan.    Time spent with patient was 60 minutes, with > 50% of time spent in review, counseling, education and coordination of care.    Jackelyn Hoehn, MD  Gastroenterology Fellow, PGY-4  [employer] [place]    I saw and evaluated the patient, participating in the key portions of the service.?? I reviewed the resident???s note.?? I agree with the resident???s findings and plan. Misty Stanley, MD          SUBJECTIVE:     CHIEF COMPLAINT: diarrhea    HISTORY OF PRESENT ILLNESS:      Michael Stokes is a 83 y.o. male (DOB: [birth year]) with PMHx of metastatic prostate cancer, HTN, DMT2, CKD and GERD who is being seen in consultation for chronic diarrhea.  He reported onset of diarrhea in mid-June 2019 to his urology provider.  He initially tried loperamide which helped improve the frequency of his stools from 3-4 loose BMs a day to about 2-3 BMs a day.  He endorses constipation occasionally after taking the imodium, and therefore has been intermittently wary of the loperamide.  During his last follow up visit with urology on 07/01/18, he reported some improvement in his diarrhea with semi-liquid stool and has been off  imodium for many days.  His enzalutamide was held for a week without improvement in his diarrhea and has since been restarted at 40mg  daily    Today he reports previously having 2-3 well-formed BMs a day that suddenly changes to 3-4 semi-solid Bristol Scale 6 BMs a day with increased urgency, especially after meals.  He reports improvement with imodium, however did not take it for more than two days at a time and only took maximum of 2 pills a day.  He has not taken imodium for the last 4 weeks as he noticed some mild improvement in his stool frequency.  He denies recent illnesses, travel, antibiotics or changes in medications.  He denies nocturnal symptoms, hematochezia or melena.  He notes that he was recommended to stop drinking diet green tea to see if this was the source of his change in stool consistency, however has not noticed a difference.  He also endorses significant PO fiber intake in his diet.  He denies fevers, chills, lightheadedness, nausea, vomiting, or abdominal pain.  Of note, he reports improved diabetes control with his last Hgb A1c of around 5.      REVIEW OF SYSTEMS:   The balance of 12 systems reviewed is negative except as noted in the HPI.     PAST MEDICAL HISTORY:  Past Medical History:   Diagnosis Date   ??? Cancer with pulmonary metastases (CMS-HCC)    ??? Chronic kidney disease    ??? Depression    ??? Diabetes Mellitus, uncontrolled 06/06/2009   ??? GERD (gastroesophageal reflux disease)    ??? Hypertension    ??? Hypertension 06/06/2009   ??? Peripheral neuropathy      Patient Active Problem List    Diagnosis Date Noted   ??? Enrolled in chronic care management 09/09/2016   ??? Gout 08/03/2015   ??? CKD stage 3 due to type 2 diabetes mellitus (CMS-HCC) 03/05/2015   ??? Durable power of attorney for healthcare exists but copy not available 07/26/2014   ??? Kyphosis, acquired 07/01/2013   ??? Dupuytren's contracture of both hands 06/16/2013   ??? Rotator cuff tear 06/16/2013   ??? Cancer with pulmonary metastases (CMS-HCC)    ??? Malignant neoplasm of prostate (CMS-HCC) 03/09/2012   ??? Polyneuropathy in diabetes (CMS-HCC) 08/03/2009   ??? Diabetes 06/06/2009   ??? Hypertension 06/06/2009   ??? Hypercholesterolemia 06/06/2009       PAST SURGICAL HISTORY:  Past Surgical History:   Procedure Laterality Date   ??? pros     ??? PROSTATECTOMY  2001   ??? PROSTATECTOMY         MEDICATIONS:  Current Outpatient Medications   Medication Sig Dispense Refill   ??? allopurinol (ZYLOPRIM) 100 MG tablet Take 2 tablets (200 mg total) by mouth daily. 180 tablet 3   ??? amitriptyline (ELAVIL) 25 MG tablet TAKE 1 TABLET(25 MG) BY MOUTH EVERY NIGHT 90 tablet 3   ??? aspirin (ENTERIC COATED ASPIRIN) 81 MG tablet Take 1 tablet (81 mg total) by mouth daily. 100 tablet 11   ??? atorvastatin (LIPITOR) 40 MG tablet Take 1 tablet (40 mg total) by mouth daily. 90 tablet 3   ??? blood sugar diagnostic (FREESTYLE LITE STRIPS) Strp by Other route Two (2) times a day. 100 strip 11   ??? carvedilol (COREG) 25 MG tablet Take 2 tablets (50 mg total) by mouth Two (2) times a day. 120 tablet 11   ??? cholecalciferol, vitamin D3, 1,000 unit tablet 2000u daily 30 tablet 11   ???  enzalutamide (XTANDI) 40 mg capsule Take 1 capsule (40 mg total) by mouth daily. 90 capsule 0   ??? hydroCHLOROthiazide (HYDRODIURIL) 50 MG tablet Take 1 tablet (50 mg total) by mouth daily. 90 tablet 3   ??? insulin ASPART (NOVOLOG FLEXPEN) 100 unit/mL (3 mL) injection pen 8u prior to breakfast and lunch, 9u prior to dinner 15 mL prn   ??? insulin glargine (LANTUS SOLOSTAR U-100 INSULIN) 100 unit/mL (3 mL) injection pen Inject 0.56 mL (56 Units total) under the skin daily. 15 mL 3   ??? losartan (COZAAR) 100 MG tablet Take 1 tablet (100 mg total) by mouth daily. 90 tablet 3   ??? pen needle, diabetic (BD ULTRA-FINE NANO PEN NEEDLE) 32 gauge x 5/32 Ndle BID as directed w/ insulin 200 each 3   ??? UNABLE TO FIND Xtendi     ??? venlafaxine (EFFEXOR-XR) 75 MG 24 hr capsule Take 1 capsule (75 mg total) by mouth daily. 90 capsule 3   ??? vitamins  A,C,E-zinc-copper (PRESERVISION AREDS) 7,160-113-100 unit-mg-unit tablet 1 tab BID  0     No current facility-administered medications for this visit.        ALLERGIES:  Amlodipine; Topiramate; Arb-angiotensin receptor antagonist; and Zolpidem    FAMILY HISTORY:  family history includes Cancer in his father and mother; Cystic fibrosis in his daughter; Deep vein thrombosis in his daughter.    SOCIAL HISTORY:  Social History     Tobacco Use   ??? Smoking status: Former Smoker     Packs/day: 0.50     Years: 15.00     Pack years: 7.50     Last attempt to quit: 11/09/1981     Years since quitting: 36.7   ??? Smokeless tobacco: Never Used   Substance Use Topics   ??? Alcohol use: No     Alcohol/week: 0.0 standard drinks   ??? Drug use: No       OBJECTIVE:   VITAL SIGNS: BP 154/70  - Pulse 72  - Ht 172.7 cm (5' 8)  - Wt 94.4 kg (208 lb 1.6 oz)  - BMI 31.64 kg/m??   Wt Readings from Last 6 Encounters:   07/15/18 94.4 kg (208 lb 1.6 oz)   07/07/18 94.2 kg (207 lb 9.6 oz)   07/01/18 94 kg (207 lb 4.8 oz)   05/20/18 95.5 kg (210 lb 8 oz)   04/06/18 96.1 kg (211 lb 14.4 oz)   03/18/18 96.9 kg (213 lb 9.6 oz)       PHYSICAL EXAM:  Constitutional: Alert, Oriented x 3, No acute distress, well nourished and well hydrated; appears younger than stated age  HEENT: PERRL, conjunctiva clear, anicteric, oropharynx clear, neck supple.   CV: Regular rate and rhythm, normal S1, S2.  Lung: Clear to auscultation bilaterally. Unlabored breathing.   Abdomen: Soft, normal bowel sounds, non-distended, non-tender.   Extremities: Warm, no edema.  MSK: No joint swelling or tenderness noted.   Skin: No rashes, jaundice or skin lesions.  Neuro: No focal deficits. Normal gait.  Mental Status: Thought organized, appropriate affect, pleasantly interactive, not anxious appearing.     DIAGNOSTIC STUDIES:  I have reviewed all pertinent diagnostic studies, including:    GI Procedures:  Colonoscopy in 2007: do not have access to report    Radiographic studies:  none    Laboratory results:  Lab Results   Component Value Date    WBC 6.5 07/01/2018    HGB 11.6 (L) 07/01/2018    HCT 34.8 (L) 07/01/2018  PLT 200 07/01/2018     Lab Results   Component Value Date    NA 140 07/14/2018    K 4.1 07/14/2018    CL 101 07/14/2018    CO2 26.0 07/14/2018    BUN 42 (H) 07/14/2018    CREATININE 2.31 (H) 07/14/2018    CALCIUM 9.5 07/14/2018    MG 1.9 03/26/2017    PHOS 3.4 03/26/2017     Lab Results   Component Value Date    ALKPHOS 138 (H) 07/01/2018    BILITOT 0.6 07/01/2018    PROT 6.1 (L) 07/01/2018    ALBUMIN 3.7 07/01/2018    ALT 20 07/01/2018    AST 22 07/01/2018    GGT 37 03/03/2013     Lab Results   Component Value Date    INR 1.0 08/15/2015

## 2018-07-14 ENCOUNTER — Ambulatory Visit: Admit: 2018-07-14 | Discharge: 2018-07-15 | Payer: PRIVATE HEALTH INSURANCE

## 2018-07-14 DIAGNOSIS — C61 Malignant neoplasm of prostate: Secondary | ICD-10-CM | POA: Diagnosis not present

## 2018-07-14 LAB — BASIC METABOLIC PANEL
ANION GAP: 13 mmol/L (ref 9–15)
BLOOD UREA NITROGEN: 42 mg/dL — ABNORMAL HIGH (ref 7–21)
BUN / CREAT RATIO: 18
CHLORIDE: 101 mmol/L (ref 98–107)
CO2: 26 mmol/L (ref 22.0–30.0)
EGFR CKD-EPI AA MALE: 30 mL/min/{1.73_m2} — ABNORMAL LOW (ref >=60)
EGFR CKD-EPI NON-AA MALE: 26 mL/min/{1.73_m2} — ABNORMAL LOW (ref >=60)
GLUCOSE RANDOM: 183 mg/dL — ABNORMAL HIGH (ref 65–179)
POTASSIUM: 4.1 mmol/L (ref 3.5–5.0)

## 2018-07-14 LAB — POTASSIUM: Potassium:SCnc:Pt:Ser/Plas:Qn:: 4.1

## 2018-07-15 ENCOUNTER — Ambulatory Visit
Admit: 2018-07-15 | Discharge: 2018-07-16 | Payer: PRIVATE HEALTH INSURANCE | Attending: Gastroenterology | Primary: Gastroenterology

## 2018-07-15 DIAGNOSIS — R197 Diarrhea, unspecified: Secondary | ICD-10-CM | POA: Diagnosis not present

## 2018-07-15 LAB — ERYTHROCYTE SEDIMENTATION RATE: Lab: 54 — ABNORMAL HIGH

## 2018-07-15 LAB — C-REACTIVE PROTEIN
C reactive protein:MCnc:Pt:Ser/Plas:Qn:: 17.2 — ABNORMAL HIGH
C-REACTIVE PROTEIN: 17.2 mg/L — ABNORMAL HIGH (ref <10.0)

## 2018-07-15 LAB — TSH: THYROID STIMULATING HORMONE: 59 u[IU]/mL — ABNORMAL HIGH (ref 0.600–3.300)

## 2018-07-15 LAB — THYROID STIMULATING HORMONE: Thyrotropin:ACnc:Pt:Ser/Plas:Qn:: 59 — ABNORMAL HIGH

## 2018-07-15 NOTE — Unmapped (Signed)
I was the supervising provider for the service delivered.    Jillene Bucks

## 2018-07-15 NOTE — Unmapped (Addendum)
-   please obtain the blood work and stool studies we have ordered  - continue to take the imodium as needed   - take 2 mg twice a day for the next two weeks   - if your stool frequency improves with this dose, you can slowly titrate down to 1 tablet in the morning with a goal of 2-3 well-formed bowel movements  - start psyllium (over the counter) to bulk up your stool  - continue to monitor your symptoms  - if your stool frequency and urgency do not improve with the imodium and psyllium, we can discuss pursuing a colonoscopy in the future  - return to clinic in 2 months

## 2018-07-16 ENCOUNTER — Ambulatory Visit: Admit: 2018-07-16 | Discharge: 2018-07-17 | Payer: PRIVATE HEALTH INSURANCE

## 2018-07-16 DIAGNOSIS — R197 Diarrhea, unspecified: Secondary | ICD-10-CM | POA: Diagnosis not present

## 2018-07-16 NOTE — Unmapped (Signed)
Spoke with pt discussed with pt Mary's recommendations- pt verbalized the understanding.

## 2018-07-16 NOTE — Unmapped (Signed)
Left HIPAA compliant message for patient to call back about recent blood work.  He does have elevated ESR (54) and CRP (17.2) concerning for an inflammatory process.  However, more significantly, he is noted to have an elevated TSH to 59.  I would expect the patient to have constipation and not diarrhea if he truly has hypothyroidism. Would like to investigate further with a free T4 and will reach out to PCP to address thyroid dysfunction.  Based on these findings and PCP recommendations, will monitor patient diarrhea on thyroid treatment and symptomatic management as discussed in clinic with repeat inflammatory markers before pursuing a colonoscopy.

## 2018-07-16 NOTE — Unmapped (Signed)
-----   Message from Estevan Oaks sent at 07/15/2018  1:33 PM EDT -----  Regarding: RE: lab follow up  Pt scheduled as requested, he has been notified.    Thanks,  Jaynie Bream  ----- Message -----  From: Chancy Hurter, ANP  Sent: 07/15/2018   1:20 PM EDT  To: Marica Otter, RN, [facility] [place]  Subject: lab follow up                                    Hey team-    J: can you pls let him know his creatinine is stable, but still above baseline? I'd like for him to get a renal ultrasound    L: can you pls get him scheduled for a renal ultrasound? He can have it at [facility] before his next visit with me if he wants. If not, same day as next visit with me is fine    Thanks, Corrie Dandy

## 2018-07-28 MED ORDER — ATORVASTATIN 40 MG TABLET
ORAL_TABLET | Freq: Every day | ORAL | 3 refills | 0 days | Status: CP
Start: 2018-07-28 — End: 2019-01-22

## 2018-07-28 MED ORDER — INSULIN GLARGINE (U-100) 100 UNIT/ML (3 ML) SUBCUTANEOUS PEN
Freq: Every day | SUBCUTANEOUS | 5 refills | 0 days | Status: CP
Start: 2018-07-28 — End: 2018-12-17

## 2018-07-28 MED ORDER — INSULIN ASPART (U-100) 100 UNIT/ML (3 ML) SUBCUTANEOUS PEN
3 refills | 0 days | Status: CP
Start: 2018-07-28 — End: 2019-05-19

## 2018-08-03 NOTE — Unmapped (Signed)
The [facility] Medicine [facility] team was unable to reach Michael Stokes to follow-up in regards to the Chronic Care Management Program. A voicemail was left with a request to return our call. This is our second attempt to contact patient.

## 2018-08-03 NOTE — Unmapped (Signed)
The [facility] Medicine [facility] Care team was unable to reach Michael Stokes to follow-up in regards to the Chronic Care Management Program. Patient was not home at the time. This is our first attempt to contact patient.

## 2018-08-05 NOTE — Unmapped (Addendum)
Chronic Care Management Note    Michael Stokes is a 83 y.o. male    PCP Action Items: none    Care Manager Action Item: none    Review last clinic note/s with patient.   1. DM2 - controlled, congratulated on changes.  Continue Lantus 56u nightly, Novolog 8,8,9.  Continue current regimen for now, reassess 3 mo instead of 6 to assess for lows, consider a1c at that time as well.  Continue ASA, statin, ARB.  Eye UTD, urine micro today  2. htn - controlled, continue.  Of note, SCr 1.4-2.3 over the past few years, consider PCP f/u or nephrology referral    Diabetes:    Lab Results   Component Value Date    A1C 5.7 07/07/2018       The Diabetes Enhanced Care Team spoke to Michael Stokes to follow up on diabetes and htn.    Pt reports a diabetes regimen of:  - Lantus 56 U nightly  - Novolog 8, 8, 9    Missed doses? no  Hypos? Patient reports sometimes feeling woozy when he goes outside to do yard work. No hypoglycemia symptoms otherwise.     Pt reports blood sugars of:  Date Before breakfast After breakfast After dinner   9/25 - - 73   9/24 - 59 -   9/23 48 - -   9/22 - - 87   9/21 - - 69   9/18 80 90 -   9/17 - 91 -   9/15 95 - -   9/13 - 89 -   9/11 74 - -     24-Hour Diet Recall:   Breakfast: 3/4-1 cup of wheat or corn flakes with 2% milk, dried strawberries, shredded wheat  Lunch: light chicken salad sandwich  Dinner: mixed veggies, sometimes shrimp, chicken, imitation crab, sweet potato    Recommended Dietitian Visit? no    HTN:  Patient has not checked his BP at home recently. Reviewed medications list. Patient taking all medications as directed.     Follow Up:    Any recent visits to outside providers? (Urgent Care, ER, Hospital)? no  Need any refills?  no    Is the patient due for Healthcare Maintenance?  Yes, zoster vaccine  -- If so, please ask to schedule an appointment for maintenance.    Does the patient need an appointment to be seen in clinic? no  Any outstanding orders that need to be addressed? no  Does patient have transportation to upcoming appointments? yes  Are there any other barriers this patient faces to receiving adequate medical care? no    Patient plan of care was reviewed. Any changes/updates needed were addressed.      The patient was discussed with Arby Barrette, PA and the following changes to his/her medications were made:  - Decrease Lantus from 56 U to 52 U nightly    The Internal Medicine Enhanced Care team was unable to reach Michael Stokes to follow-up in regards to their recent telephone encounter with our Diabetes Team. A voicemail was left with a request to return our call. This is our first attempt to contact patient. Will attempt again 9/27.     All medical decisions were made by Arby Barrette, PA.

## 2018-08-06 NOTE — Unmapped (Signed)
The Internal Medicine Enhanced Care team was unable to reach Michael Stokes to follow-up in regards to their recent telephone encounter with our Diabetes Team. Spouse picked up and reported that he wasn't available at the time. He will be back later in the afternoon, and spouse agreed to have call back then. This is our second attempt to contact patient.

## 2018-08-06 NOTE — Unmapped (Signed)
CA was able to reach patient to f/u on most recent CCM outreach. CA informed patient of provider's decision to decrease lantus from 56 U to 52 U and continue novolog 8, 8, 9. Patient expressed understanding and agreed to make the change. CA will f/u in 2 weeks.

## 2018-08-06 NOTE — Unmapped (Signed)
I was the supervising provider for the service delivered.    Michael Stokes

## 2018-08-16 ENCOUNTER — Ambulatory Visit: Admit: 2018-08-16 | Discharge: 2018-08-16 | Payer: PRIVATE HEALTH INSURANCE

## 2018-08-16 DIAGNOSIS — R944 Abnormal results of kidney function studies: Secondary | ICD-10-CM | POA: Diagnosis not present

## 2018-08-16 DIAGNOSIS — N179 Acute kidney failure, unspecified: Secondary | ICD-10-CM | POA: Diagnosis not present

## 2018-08-16 DIAGNOSIS — N189 Chronic kidney disease, unspecified: Secondary | ICD-10-CM | POA: Diagnosis not present

## 2018-08-16 DIAGNOSIS — R93422 Abnormal radiologic findings on diagnostic imaging of left kidney: Secondary | ICD-10-CM | POA: Diagnosis not present

## 2018-08-16 DIAGNOSIS — I129 Hypertensive chronic kidney disease with stage 1 through stage 4 chronic kidney disease, or unspecified chronic kidney disease: Secondary | ICD-10-CM | POA: Diagnosis not present

## 2018-08-16 DIAGNOSIS — R7989 Other specified abnormal findings of blood chemistry: Secondary | ICD-10-CM | POA: Diagnosis not present

## 2018-08-19 ENCOUNTER — Other Ambulatory Visit: Admit: 2018-08-19 | Discharge: 2018-08-20 | Payer: PRIVATE HEALTH INSURANCE

## 2018-08-19 ENCOUNTER — Ambulatory Visit
Admit: 2018-08-19 | Discharge: 2018-08-20 | Payer: PRIVATE HEALTH INSURANCE | Attending: Nurse Practitioner | Primary: Nurse Practitioner

## 2018-08-19 DIAGNOSIS — N189 Chronic kidney disease, unspecified: Secondary | ICD-10-CM | POA: Diagnosis not present

## 2018-08-19 DIAGNOSIS — E1122 Type 2 diabetes mellitus with diabetic chronic kidney disease: Secondary | ICD-10-CM | POA: Diagnosis not present

## 2018-08-19 DIAGNOSIS — Z923 Personal history of irradiation: Secondary | ICD-10-CM | POA: Diagnosis not present

## 2018-08-19 DIAGNOSIS — M549 Dorsalgia, unspecified: Secondary | ICD-10-CM | POA: Diagnosis not present

## 2018-08-19 DIAGNOSIS — Z23 Encounter for immunization: Secondary | ICD-10-CM | POA: Diagnosis not present

## 2018-08-19 DIAGNOSIS — R590 Localized enlarged lymph nodes: Secondary | ICD-10-CM | POA: Diagnosis not present

## 2018-08-19 DIAGNOSIS — I129 Hypertensive chronic kidney disease with stage 1 through stage 4 chronic kidney disease, or unspecified chronic kidney disease: Secondary | ICD-10-CM | POA: Diagnosis not present

## 2018-08-19 DIAGNOSIS — C61 Malignant neoplasm of prostate: Secondary | ICD-10-CM | POA: Diagnosis not present

## 2018-08-19 DIAGNOSIS — G8929 Other chronic pain: Secondary | ICD-10-CM | POA: Diagnosis not present

## 2018-08-19 DIAGNOSIS — R197 Diarrhea, unspecified: Secondary | ICD-10-CM | POA: Diagnosis not present

## 2018-08-19 DIAGNOSIS — C7800 Secondary malignant neoplasm of unspecified lung: Secondary | ICD-10-CM | POA: Diagnosis not present

## 2018-08-19 LAB — COMPREHENSIVE METABOLIC PANEL
ALBUMIN: 3.7 g/dL (ref 3.5–5.0)
ALKALINE PHOSPHATASE: 104 U/L (ref 38–126)
ALT (SGPT): 23 U/L (ref 19–72)
ANION GAP: 8 mmol/L (ref 7–15)
AST (SGOT): 29 U/L (ref 19–55)
BILIRUBIN TOTAL: 0.7 mg/dL (ref 0.0–1.2)
BLOOD UREA NITROGEN: 41 mg/dL — ABNORMAL HIGH (ref 7–21)
BUN / CREAT RATIO: 18
CALCIUM: 9 mg/dL (ref 8.5–10.2)
CHLORIDE: 98 mmol/L (ref 98–107)
CO2: 30 mmol/L (ref 22.0–30.0)
EGFR CKD-EPI AA MALE: 30 mL/min/{1.73_m2} — ABNORMAL LOW (ref >=60)
EGFR CKD-EPI NON-AA MALE: 26 mL/min/{1.73_m2} — ABNORMAL LOW (ref >=60)
GLUCOSE RANDOM: 113 mg/dL (ref 65–179)
POTASSIUM: 4.1 mmol/L (ref 3.5–5.0)
SODIUM: 136 mmol/L (ref 135–145)

## 2018-08-19 LAB — THYROID STIMULATING HORMONE: Thyrotropin:ACnc:Pt:Ser/Plas:Qn:: 59.66 — ABNORMAL HIGH

## 2018-08-19 LAB — CBC W/ AUTO DIFF
BASOPHILS RELATIVE PERCENT: 0.7 %
EOSINOPHILS ABSOLUTE COUNT: 0.3 10*9/L (ref 0.0–0.4)
EOSINOPHILS RELATIVE PERCENT: 3.7 %
HEMATOCRIT: 33.9 % — ABNORMAL LOW (ref 41.0–53.0)
HEMOGLOBIN: 11.2 g/dL — ABNORMAL LOW (ref 13.5–17.5)
LARGE UNSTAINED CELLS: 2 % (ref 0–4)
LYMPHOCYTES ABSOLUTE COUNT: 1.7 10*9/L (ref 1.5–5.0)
LYMPHOCYTES RELATIVE PERCENT: 26.1 %
MEAN CORPUSCULAR HEMOGLOBIN CONC: 33.1 g/dL (ref 31.0–37.0)
MEAN CORPUSCULAR HEMOGLOBIN: 31.3 pg (ref 26.0–34.0)
MEAN CORPUSCULAR VOLUME: 94.4 fL (ref 80.0–100.0)
MEAN PLATELET VOLUME: 7.8 fL (ref 7.0–10.0)
MONOCYTES ABSOLUTE COUNT: 0.3 10*9/L (ref 0.2–0.8)
MONOCYTES RELATIVE PERCENT: 4.6 %
NEUTROPHILS RELATIVE PERCENT: 62.6 %
PLATELET COUNT: 181 10*9/L (ref 150–440)
RED BLOOD CELL COUNT: 3.59 10*12/L — ABNORMAL LOW (ref 4.50–5.90)
RED CELL DISTRIBUTION WIDTH: 15.9 % — ABNORMAL HIGH (ref 12.0–15.0)
WBC ADJUSTED: 6.6 10*9/L (ref 4.5–11.0)

## 2018-08-19 LAB — PROSTATE SPECIFIC ANTIGEN: Prostate specific Ag:MCnc:Pt:Ser/Plas:Qn:: <0.1

## 2018-08-19 LAB — EGFR CKD-EPI NON-AA MALE: Lab: 26 — ABNORMAL LOW

## 2018-08-19 LAB — MEAN CORPUSCULAR HEMOGLOBIN CONC: Lab: 33.1

## 2018-08-19 LAB — FREE T4: Thyroxine.free:MCnc:Pt:Ser/Plas:Qn:: 0.53 — ABNORMAL LOW

## 2018-08-19 NOTE — Unmapped (Signed)
Labs drawn and sent for analysis.  Care provided by  Y Cheek.

## 2018-08-19 NOTE — Unmapped (Addendum)
Lab Results   Component Value Date    PSA <0.10 07/01/2018    PSA <0.10 05/20/2018    PSA <0.10 03/18/2018    PSA 0.17 02/15/2018    PSA 1.69 01/14/2018    PSA 18.00 (H) 12/03/2017       Blood pressure medicine:    Carvedilol: 50 mg two times/day (2 tabs two times/day)  Hydrochlorathiazide: 50 mg/day  Losartaan: 100 mg/day    ?? Continue the Xtandi (1 pill/day)    ?? Call the nephrology clinic to make a follow up appointment: Phone:??[phone]     ?? In 2 weeks get kidney number checked at [facility]    ?? Come back in 6 weeks    ?? Make sure to drink plenty of water    ?? Flu shot today    ?? Checking thyroid numbers        Albertina Parr, NP-C, OCN  Adult Nurse Practitioner  Urology and Medical Oncology    If you have been prescribed a medication today, always read the package insert that comes with the medication.    In the event of an emergency, always call 911    Urology:  Main clinic & [facility]: [phone]  Fax: [phone]    [facility]:  Phone: [phone]  Fax: [phone]    After hours/nights/weekends:  Hospital Operator: [phone]    [email or web address]  [email or web address]

## 2018-08-19 NOTE — Unmapped (Signed)
CC: f/u prostate cancer    A&P:    1.  mCRPC:    ?? Lupron 22.5 mg due again on or after 11/17/2018  ?? Rec daily calcium and vitamin D  ?? Continue good self care  ?? Continue enzalutamide 40 mg daily (one tab/day)      2. HTN    ?? Continue carvedilol to 50 mg BID.   ?? Continue HCTZ and losartan  ?? Maintain blood pressure log at home    3. Diarrhea    ?? Resolved  ?? F/u with GI as scheduled      4. CKD w/ SCr rise    ?? RUS ok  ?? Gave him phone number to nephrology clinic, and he will call to make a follow up with them    5. Abnormal TSH    ?? Found at GI visit  ?? Will recheck today and add FT4. If abnormal, will send message to Michael Stokes, [place]; he sees Michael Stokes for DM and HTN management    Flu shot given today      RTC 6 weeks for labs, clinic visit. I will send him his PSA via MyChart when I get it back    ADD: PSA remains great. TFTs are abnormal; msg sent to both Michael Stokes and Michael Stokes    Approximately 25 minutes was spent in the care of this patient, half in education and counseling.  A copy of the AVS was provided to the patient which includes key points from our visit as well as our contact information.      Albertina Parr, NP-C  Adult Nurse Practitioner  Urology & Medical Oncology      Onc history:    --Status post a radical prostatectomy in 2001 in [place].   --He had salvage radiotherapy in 2002 and had  been followed with a gradually rising PSA. He was found to have multiple pulmonary nodules, but no bone metastases.   --Lupron was started in early 2009 with dramatic reduction in his pulmonary disease.    --His PSA has been rising on ADT, so Casodex (2010, PSA 9.6) was started and subsequently Zytiga (04/2011, for sx C7 lesion without PSA rise).  --XRT C4-T2 2012 before starting Zytiga  --He discontinued Zytiga because he had substantial weight gain on Zytiga and Prednisone.   --He also discontinued Xtandi (started in 05/2012) because of fatigue and rash, joint swelling.    In looking back through his notes it looks like his Lupron was stoppped when he started casodex in 2010    Despite the side effects, he had a sustained biochemical response. He fractured a vertebrae falling off of a ladder and now has chronic back pain.    --Restarted primary ADT with Lupron 22.5 mg in 03/2016  --PSA rise; Scans 11/2017 with increased size of 1 pulm nodule and LAN  --Started Xtandi end of 11/2017 at a reduced dose of 80 mg in an effort to minimize SE        HISTORY OF PRESENT ILLNESS:     Michael Stokes returns today for scheduled followup.    Today, he reports    --his diarrhea has resolved. Bowel movements are mostly normal in frequency and consistency  --taking enzaluatmide 40 mg daily  --back still a little sore from when he fell, but he has been able to do yardwork, including using a chain saw to cut down some trees     No n/v, headaches, SOB, cough, CP,  LUTS, hematuria, LE edema    No LUTS, bone pain, weight loss    BPs at home [id number]          Allergies   Allergen Reactions   ??? Amlodipine Swelling   ??? Topiramate Swelling   ??? Arb-Angiotensin Receptor Antagonist Other (See Comments)     Hyperkalemia   ??? Zolpidem Other (See Comments)     Hangover     Meds: Updated in EPIC    10 pt ROS reviewed and negative except per HPI           VITAL SIGNS:          There were no vitals filed for this visit.  GENERAL: Pleasant, NAD.   VS: Reviewed and unremarkable  Head: normocephalic  Eyes: sclera aniecteric  Pulm: Non labored WOB; CTA bil  CV: RRR; no MRG  Abd: Doft, NT, BS x 4, normoactive  Skin: Warm, dry  Msk: No Le edema  NEURO: A&O x 3      Data:    CT CAP: 5/17  IMPRESSION:   1. New retroperitoneal and pelvic adenopathy concerning for metastatic disease.  2. Pulmonary nodules, at least one of which is slightly increased in size, and a few of which are new compared to prior, concerning for metastatic disease.    Bone scan: 5/17  IMPRESSION:   -Grossly unchanged focal uptake in the right aspect of C7. No new osseous lesion identified.    CT CAP 11/2017    Impression     - Increased size of right lower lobe pulmonary nodule.    - Enlarged aortocaval lymph node now measuring 1.7 cm. Decreased size of previously seen retrocaval and external iliac lymph nodes.       Bone scan 11/2017    Impression     Increased focal uptake at C7 compared to most recent bone scan (03/20/2016) is concerning for recurrent disease, though is indeterminate given known history of metastatic lesion at this site.     Renal ultrasound 08/16/18    Impression       --Atrophic, echogenic right kidney, similar to prior CT.    --Mildly lobulated left renal contour. Otherwise, unremarkable sonographic appearance of the left kidney.         Lab Results   Component Value Date    PSA <0.10 07/01/2018    PSA <0.10 05/20/2018    PSA <0.10 03/18/2018    PSA 0.17 02/15/2018    PSA 1.69 01/14/2018    PSA 18.00 (H) 12/03/2017     03/27/2015 = 3.9     PSA Diagnostic   Date Value Ref Range Status   12/25/2014 3.9 0.0 - 4.0 ng/mL Final   10/20/2014 1.7 0.0 - 4.0 ng/mL Final   09/18/2014 2.1 0.0 - 4.0 ng/mL Final   06/12/2014 1.3 0.0 - 4.0 ng/mL Final   03/09/2014 0.5 0.0 - 4.0 ng/mL Final   12/08/2013 0.2 0.0 - 4.0 ng/mL Final   09/08/2013 <0.1 0.0 - 4.0 ng/mL Final   06/09/2013 <0.1 0.0 - 4.0 ng/mL Final   03/03/2013 <0.1 0.0 - 4.0 ng/mL Final   12/02/2012 <0.1 0.0 - 4.0 NG/ML Final   08/26/2012 <0.1 0.0 - 4.0 NG/ML Final   05/28/2012 <0.1 0.0 - 4.0 NG/ML Final   02/26/2012 <0.1 0.0 - 4.0 NG/ML Final   12/04/2011 <0.1 0.0 - 4.0 NG/ML Final   07/24/2011 1.4 0.0 - 4.0 NG/ML Final   04/17/2011 3.0 0.0 - 4.0 NG/ML Final  01/09/2011 3.1 0.0 - 4.0 NG/ML Final     2010-10-10 PSA, DIAGNOSTIC 2.3 NG/ML 0.0-4.0    2010-06-20 PSA, DIAGNOSTIC 1.2 NG/ML 0.0-4.0    2010-01-24 PSA, DIAGNOSTIC 1.1 NG/ML 0.0-4.0           His PSA increased previously to 9.6 . In 04/26/09 it was 3.7, 01/18/09 it was 1.2.

## 2018-08-23 NOTE — Unmapped (Signed)
-----   Message from Julieanne Cotton, [place] sent at 08/23/2018 12:48 PM EDT -----  Regarding: RE: thoughts re: management of hypothyroidism  No, not his PCP but manage his DM.  I will have our staff schedule PCP f/u to address TFTs.    Lyric,  Can you schedule PCP f/u w/ S. [facility] 1st available as he is overdue for f/u and for recent abnormal thyroid function tests?  Thanks    R. Arby Barrette  ----- Message -----  From: Chancy Hurter, ANP  Sent: 08/19/2018   3:36 PM EDT  To: Julieanne Cotton, PA, #  Subject: thoughts re: management of hypothyroidism        Hi, Michael Stokes and Dr Loel Dubonnet,    [facility] you are doing well.    I see Michael Stokes for his prostate cancer. I rechecked his TSH and added FT4 today, given his abnormal TSH at his recent visit with GI (Dr A.- he forgot to reply to the message you sent him about this).    TSH- 59.6  FT4- 0.53    Michael Stokes- are you his PCP? I know that you manage DM and HTN, but wasn't sure if this would be in your wheelhouse of something to manage for him.I want to make sure I get him where he needs to go.    Many thanks, Corrie Dandy

## 2018-08-27 ENCOUNTER — Ambulatory Visit
Admit: 2018-08-27 | Discharge: 2018-08-28 | Payer: PRIVATE HEALTH INSURANCE | Attending: Student in an Organized Health Care Education/Training Program | Primary: Student in an Organized Health Care Education/Training Program

## 2018-08-27 DIAGNOSIS — E039 Hypothyroidism, unspecified: Secondary | ICD-10-CM | POA: Diagnosis not present

## 2018-08-27 DIAGNOSIS — G629 Polyneuropathy, unspecified: Secondary | ICD-10-CM | POA: Diagnosis not present

## 2018-08-27 DIAGNOSIS — E1122 Type 2 diabetes mellitus with diabetic chronic kidney disease: Secondary | ICD-10-CM | POA: Diagnosis not present

## 2018-08-27 DIAGNOSIS — N183 Chronic kidney disease, stage 3 (moderate): Secondary | ICD-10-CM | POA: Diagnosis not present

## 2018-08-27 DIAGNOSIS — I10 Essential (primary) hypertension: Secondary | ICD-10-CM | POA: Diagnosis not present

## 2018-08-27 MED ORDER — VENLAFAXINE ER 37.5 MG CAPSULE,EXTENDED RELEASE 24 HR
ORAL_CAPSULE | Freq: Every day | ORAL | 3 refills | 0.00000 days | Status: CP
Start: 2018-08-27 — End: 2018-09-13

## 2018-08-27 MED ORDER — HYDRALAZINE 25 MG TABLET
ORAL_TABLET | Freq: Three times a day (TID) | ORAL | 11 refills | 0.00000 days | Status: CP
Start: 2018-08-27 — End: 2018-11-24

## 2018-08-27 MED ORDER — LEVOTHYROXINE 25 MCG TABLET
ORAL_TABLET | Freq: Every day | ORAL | 3 refills | 0.00000 days | Status: CP
Start: 2018-08-27 — End: 2018-09-30

## 2018-08-27 MED ORDER — ALLOPURINOL 100 MG TABLET
ORAL_TABLET | Freq: Every day | ORAL | 3 refills | 0.00000 days | Status: CP
Start: 2018-08-27 — End: 2018-11-24

## 2018-08-27 NOTE — Unmapped (Addendum)
--   Please see your nephrologist sooner than later  -- Changes made to meds: Decreased the dose of venlafaxine and allopurinol. Avoid pain killers like advil, ibuprofen and BC powder. For your BP we have added hydralazine 25 mg three times a day and I want you to discontinue HCTZ.   -- Take on an empty stomach (30 mins before meals) levothyroxine 25 mcg daily  -- See [place] back in 6 weeks to check on your thyroid  -- For your DM: decrease your lantus to 40 U nightly and continue using your meal time insulin as before.   -- Thyroid ultrasound, someone will call you to schedule that

## 2018-08-28 NOTE — Unmapped (Signed)
INTERNAL MEDICINE [facility] CLINIC NOTE  08/27/2018    PCP: [name], MD     Assessment and Plan    Michael Stokes was seen today for results.    Diagnoses and all orders for this visit:    Hypertension; Elevated to 186/81, which he thinks is because he is in the clinic and at home it is usually 140-150s.   -     hydrALAZINE (APRESOLINE) 25 MG tablet; Take 1 tablet (25 mg total) by mouth Three (3) times a day.  - Stopped HCTZ as with his Cr Cl he is probably not getting any benefit.       CKD stage 3 due to type 2 diabetes mellitus (CMS-HCC): Dose reduced the following drugs based on Cr Clearance of 30  -     allopurinol (ZYLOPRIM) 100 MG tablet; Take 0.5 tablets (50 mg total) by mouth daily.  -     venlafaxine (EFFEXOR-XR) 37.5 MG 24 hr capsule; Take 2 capsules (75 mg total) by mouth daily.  - Encouraged him to see his nephrologist  - Decreased insulin lantus to 40 U as he was having lows the last one week and I worry about insulin stacking in him. Follows PA [name] for his DM needs. His HbA1c was 5.7     Hypothyroidism, unspecified type: He has h/o iodine use as an adolescent (unclear if he had iodine deficiency or did he need ablation). He does not have symptoms of hypothyroidism presently. He has h/o radiation to his cervical spine (had bony mets to his cervical spine in 2016). It may be possible that he may have been hypothyroid for a while and it is only being caught on right now. He also had elevated CRP and ESR sometime back when he was having diarrhea but his stool lactoferrin was not high. It is unclear to me if this is thyroiditis given his past history or radiation induced hypothyroidism. He is currently on hormonal therapy and not immunotherapy for his prostate cancer and he does not have exposure to lithium or amiodarone.   -     levothyroxine (SYNTHROID, LEVOTHROID) 25 MCG tablet; Take 1 tablet (25 mcg total) by mouth daily.  -     US Thyroid; Future  - TSH recheck next visit.       Follow up as scheduled or sooner as needed.    Patient was seen and discussed with Dr. Gerhard Munch who is in agreement with the assessment and plan as outlined above.       Subjective    Problem List:  Patient Active Problem List   Diagnosis   ??? Diabetes   ??? Hypertension   ??? Malignant neoplasm of prostate (CMS-HCC)   ??? Polyneuropathy in diabetes (CMS-HCC)   ??? Hypercholesterolemia   ??? Cancer with pulmonary metastases (CMS-HCC)   ??? Dupuytren's contracture of both hands   ??? Rotator cuff tear   ??? Kyphosis, acquired   ??? Durable power of attorney for healthcare exists but copy not available   ??? CKD stage 3 due to type 2 diabetes mellitus (CMS-HCC)   ??? Gout   ??? Enrolled in chronic care management       HPI  [name] is a 83 y.o. year old male with the above problem list who presents to the clinic for   Chief Complaint   Patient presents with   ??? Results   Here for his thyroid test results. Was recently seen by GI for c/o diarrhea. As a part  of that workup he underwent TSH testing and it was consistent with hypothyroidism and on repeat testing, his TSH still remained significantly high. He denies fatigue,depression, weight changes, skin and hair changes. His diarrhea is now better after taking imodium for a few days. He does recall that he needed iodine for his thyroid as an adolescent. He does not remember much other than that. He does not have a family history of AI disorders.     Meds and allergies were reviewed in Epic    ROS: 10 point ROS was performed and is otherwise negative other than mentioned in the HPI    Objective  PE:  Vitals:    08/27/18 1415   BP: 186/81   Pulse: 82   Resp: 16   SpO2: 98%     General: well-appearing in NAD  Eyes: EOMI, sclera clear, PERRL  Neck: No obvious nodules palpated.   CV: regular, no murmurs  Resp: CTAB, no wheezes or crackles, normal WOB  GI: soft, NTND, NABS  MSK: full ROM, no deformity noted  Skin: clean and dry, no rashes or lesions noted  Ext: no cyanosis/clubbing/edema  Neuro: alert, follows commands. CN II-XII grossly intact

## 2018-08-29 NOTE — Unmapped (Signed)
I reviewed with the resident the medical history and the resident???s findings on physical examination.  I discussed with the resident the patient???s diagnosis and concur with the treatment plan as documented in the resident note. Doug Sou, MD

## 2018-08-29 NOTE — Unmapped (Signed)
I saw and evaluated the patient, participating in the key portions of the service.  I reviewed the resident’s note.  I agree with the resident’s findings and plan. Anjolina Byrer B Lauree Yurick, MD

## 2018-09-02 NOTE — Unmapped (Signed)
I was the supervising provider for the service delivered.    Jillene Bucks

## 2018-09-02 NOTE — Unmapped (Signed)
Chronic Care Management Note    Michael Stokes is a 83 y.o. male    PCP Action Items: none    Care Manager Action Item: none    Review last clinic note/s with patient.   Decreased insulin lantus to 40 U as he was having lows the last one week and I worry about insulin stacking in him. Follows PA [name] for his DM needs. His HbA1c was 5.7.  Stopped HCTZ as with his Cr Cl he is probably not getting any benefit.     Diabetes:  Lab Results   Component Value Date    A1C 5.7 07/07/2018       The Diabetes Enhanced Care Team spoke to [facility] to follow up on diabetes and hypertension.    Pt reports a diabetes regimen of:  - Lantus 54 U nightly (see previous visit note above)  - Novolog 8, 8, 9    Missed doses? no  Hypos? Yes, this morning. Patient reports feeling funny and having some lightheadedness. Denies dizziness. Reports he doesn't feel symptoms if sugars are in 80-90 range.     Pt reports blood sugars of:  Date Before breakfast After lunch After dinner   10/24 49 - -   10/23 81 - -   10/21 - 98 -   10/20 119 - -   10/18 - 107 -   10/17 - 111 106     Recommended Dietitian Visit? no    Hypertension:  Patient reports that he has stopped HCTZ and started hydralazine 25 mg 3x/day.     Additional comments: Patient reports he was unable to pick up venlafaxine 37.5 mg. Told by pharmacy that they didn't have it. He reports only having 3 doses left and will go back to 75 mg once daily after. Attempted to reach pharmacy regarding issue. Spoke with rep who said patient was dispensed 5 day supply but was unsure why order was not filled completely. Transferred to pharmacist, put on hold, then call ended.     Follow Up:    Any recent visits to outside providers? (Urgent Care, ER, Hospital)? no  Need any refills?  no    Need any medical equipment?  no    Is the patient due for Healthcare Maintenance?  no  -- If so, please ask to schedule an appointment for maintenance.    Does the patient need an appointment to be seen in clinic? no  Any outstanding orders that need to be addressed? no  Does patient have transportation to upcoming appointments? yes  Are there any other barriers this patient faces to receiving adequate medical care? no    Patient plan of care was reviewed. Any changes/updates needed were addressed.    The patient was discussed with Redmond Pulling, ANP and the following changes to his/her medications were made:  - Decrease Lantus from 54 U to 48 U    All medical decisions were made by Redmond Pulling, ANP.

## 2018-09-06 NOTE — Unmapped (Signed)
I was the supervising provider for the delivery of this diabetes management service.    Redmond Pulling, ANP

## 2018-09-13 ENCOUNTER — Ambulatory Visit: Admit: 2018-09-13 | Discharge: 2018-09-13 | Payer: PRIVATE HEALTH INSURANCE

## 2018-09-13 DIAGNOSIS — I10 Essential (primary) hypertension: Secondary | ICD-10-CM | POA: Diagnosis not present

## 2018-09-13 DIAGNOSIS — E118 Type 2 diabetes mellitus with unspecified complications: Secondary | ICD-10-CM | POA: Diagnosis not present

## 2018-09-13 DIAGNOSIS — Z794 Long term (current) use of insulin: Secondary | ICD-10-CM | POA: Diagnosis not present

## 2018-09-13 DIAGNOSIS — E1122 Type 2 diabetes mellitus with diabetic chronic kidney disease: Secondary | ICD-10-CM | POA: Diagnosis not present

## 2018-09-13 DIAGNOSIS — N183 Chronic kidney disease, stage 3 (moderate): Secondary | ICD-10-CM | POA: Diagnosis not present

## 2018-09-13 LAB — CBC
HEMATOCRIT: 34 % — ABNORMAL LOW (ref 41.0–53.0)
HEMOGLOBIN: 11.1 g/dL — ABNORMAL LOW (ref 13.5–17.5)
MEAN CORPUSCULAR HEMOGLOBIN CONC: 32.5 g/dL (ref 31.0–37.0)
MEAN CORPUSCULAR HEMOGLOBIN: 31 pg (ref 26.0–34.0)
MEAN CORPUSCULAR VOLUME: 95.4 fL (ref 80.0–100.0)
MEAN PLATELET VOLUME: 8 fL (ref 7.0–10.0)
PLATELET COUNT: 212 10*9/L (ref 150–440)
RED CELL DISTRIBUTION WIDTH: 14.7 % (ref 12.0–15.0)
WBC ADJUSTED: 9.1 10*9/L (ref 4.5–11.0)

## 2018-09-13 LAB — URINALYSIS
BACTERIA: NONE SEEN /HPF
BILIRUBIN UA: NEGATIVE
BLOOD UA: NEGATIVE
GLUCOSE UA: NEGATIVE
KETONES UA: NEGATIVE
LEUKOCYTE ESTERASE UA: NEGATIVE
NITRITE UA: NEGATIVE
PROTEIN UA: NEGATIVE
RBC UA: <1 /HPF (ref <=3)
SPECIFIC GRAVITY UA: 1.02 (ref 1.003–1.030)
SQUAMOUS EPITHELIAL: <1 /HPF (ref 0–5)
UROBILINOGEN UA: 0.2
WBC UA: <1 /HPF (ref <=2)

## 2018-09-13 LAB — BASIC METABOLIC PANEL
ANION GAP: 10 mmol/L (ref 7–15)
BLOOD UREA NITROGEN: 33 mg/dL — ABNORMAL HIGH (ref 7–21)
BUN / CREAT RATIO: 16
CALCIUM: 9.2 mg/dL (ref 8.5–10.2)
CHLORIDE: 105 mmol/L (ref 98–107)
CO2: 23 mmol/L (ref 22.0–30.0)
EGFR CKD-EPI AA MALE: 33 mL/min/{1.73_m2} — ABNORMAL LOW (ref >=60)
EGFR CKD-EPI NON-AA MALE: 29 mL/min/{1.73_m2} — ABNORMAL LOW (ref >=60)
GLUCOSE RANDOM: 142 mg/dL (ref 65–179)
POTASSIUM: 4.6 mmol/L (ref 3.5–5.0)
SODIUM: 138 mmol/L (ref 135–145)

## 2018-09-13 LAB — LIPID PANEL
CHOLESTEROL/HDL RATIO SCREEN: 4.2 (ref <5.0)
CHOLESTEROL: 133 mg/dL (ref 100–199)
HDL CHOLESTEROL: 32 mg/dL — ABNORMAL LOW (ref 40–59)
LDL CHOLESTEROL CALCULATED: 67 mg/dL (ref 60–99)
NON-HDL CHOLESTEROL: 101 mg/dL
VLDL CHOLESTEROL CAL: 34.4 mg/dL (ref 12–42)

## 2018-09-13 LAB — CALCIUM: Calcium:MCnc:Pt:Ser/Plas:Qn:: 9.2

## 2018-09-13 LAB — KETONES UA: Lab: NEGATIVE

## 2018-09-13 LAB — IRON PANEL
IRON SATURATION (CALC): 14 % — ABNORMAL LOW (ref 20–50)
TOTAL IRON BINDING CAPACITY (CALC): 298.4 mg/dL (ref 252.0–479.0)
TRANSFERRIN: 236.8 mg/dL (ref 200.0–380.0)

## 2018-09-13 LAB — MEAN CORPUSCULAR VOLUME: Lab: 95.4

## 2018-09-13 LAB — ALBUMIN: Albumin:MCnc:Pt:Ser/Plas:Qn:: 3.9

## 2018-09-13 LAB — PTH: PARATHYROID HORMONE INTACT: 123.5 pg/mL — ABNORMAL HIGH (ref 12.0–72.0)

## 2018-09-13 LAB — LDL CHOLESTEROL CALCULATED: Cholesterol.in LDL:MCnc:Pt:Ser/Plas:Qn:Calculated: 67

## 2018-09-13 LAB — FERRITIN: Ferritin:MCnc:Pt:Ser/Plas:Qn:: 106

## 2018-09-13 LAB — PHOSPHORUS: Phosphate:MCnc:Pt:Ser/Plas:Qn:: 3.2

## 2018-09-13 LAB — GLUCOSE RANDOM: Glucose:MCnc:Pt:Ser/Plas:Qn:: 142

## 2018-09-13 LAB — TRANSFERRIN: Transferrin:MCnc:Pt:Ser/Plas:Qn:: 236.8

## 2018-09-13 MED ORDER — VENLAFAXINE ER 75 MG CAPSULE,EXTENDED RELEASE 24 HR
ORAL_CAPSULE | Freq: Every day | ORAL | 2 refills | 0 days
Start: 2018-09-13 — End: 2018-12-04

## 2018-09-13 NOTE — Unmapped (Addendum)
Assessment/Plan:     Lab Results   Component Value Date    A1C 5.8 09/13/2018    A1C 5.7 07/07/2018    A1C 8.0 (H) 04/06/2018       1. DM2 - remains controlled, continue to follow closely for lows, continue Lantus 48u nightly and Novolog 8,8,9u.  Phone call 2 wks to assess.  Reassess a1c 3 mo.  ACR, retinal exam UTD  2. htn - recheck 130/52 (hctz recently d/cd, started hydralazine 25mg  TID, tolerating).  Continue regimen  3. CKD3 - lost to f/u, last seen by nephrology in 2017, referral placed today along w/ BMP, CBC, ferritin, iron panel, u/a, lipids, phosphorus, PTH.      **labs to be addressed by nephrology but notable for low Fe sat, elevated PTH of 123, baseline appears to be 97-113.  Chol at goal.  Remainder of labs stable or unremarkable    F/u: 3 mo a1c    Subject/Objective:     Chief Complaint   Patient presents with   ??? Diabetes     S: 83 y.o. year old male with PMHx significant for   Past Medical History:   Diagnosis Date   ??? Cancer with pulmonary metastases (CMS-HCC)    ??? Chronic kidney disease    ??? Depression    ??? Diabetes Mellitus, uncontrolled 06/06/2009   ??? GERD (gastroesophageal reflux disease)    ??? Hypertension    ??? Hypertension 06/06/2009   ??? Peripheral neuropathy    .     Patient Active Problem List   Diagnosis   ??? Diabetes   ??? Hypertension   ??? Malignant neoplasm of prostate (CMS-HCC)   ??? Polyneuropathy in diabetes (CMS-HCC)   ??? Hypercholesterolemia   ??? Cancer with pulmonary metastases (CMS-HCC)   ??? Dupuytren's contracture of both hands   ??? Rotator cuff tear   ??? Kyphosis, acquired   ??? Durable power of attorney for healthcare exists but copy not available   ??? CKD stage 3 due to type 2 diabetes mellitus (CMS-HCC)   ??? Gout   ??? Enrolled in chronic care management       Presents for DM f/u.  Starting having some FBG lows after last visit w/ author, Lantus reduced from 56 to 52u, still having lows then reduced to 48u approx 1 wk ago, no lows since.  Throughout the day, no reported lows       Your Medication List          Accurate as of September 13, 2018 10:23 AM. If you have any questions, ask your nurse or doctor.            CHANGE how you take these medications    venlafaxine 75 MG 24 hr capsule  Commonly known as:  EFFEXOR XR  Take 1 capsule (75 mg total) by mouth daily.  What changed:  medication strength  Changed by:  Dwana Melena, PA        CONTINUE taking these medications    allopurinol 100 MG tablet  Commonly known as:  ZYLOPRIM  Take 0.5 tablets (50 mg total) by mouth daily.     amitriptyline 25 MG tablet  Commonly known as:  ELAVIL  TAKE 1 TABLET(25 MG) BY MOUTH EVERY NIGHT     aspirin 81 MG tablet  Commonly known as:  ENTERIC COATED ASPIRIN  Take 1 tablet (81 mg total) by mouth daily.     atorvastatin 40 MG tablet  Commonly known as:  LIPITOR  Take 1 tablet (40 mg total) by mouth daily.     blood sugar diagnostic Strp  Commonly known as:  FREESTYLE LITE STRIPS  by Other route Two (2) times a day.     carvedilol 25 MG tablet  Commonly known as:  COREG  Take 2 tablets (50 mg total) by mouth Two (2) times a day.     cholecalciferol (vitamin D3) 1,000 unit (25 mcg) tablet  2000u daily     hydrALAZINE 25 MG tablet  Commonly known as:  APRESOLINE  Take 1 tablet (25 mg total) by mouth Three (3) times a day.     insulin ASPART 100 unit/mL (3 mL) injection pen  Commonly known as:  NovoLOG FLEXPEN  8u prior to breakfast and lunch, 9u prior to dinner     insulin glargine 100 unit/mL (3 mL) injection pen  Commonly known as:  LANTUS SOLOSTAR U-100 INSULIN  Inject 0.56 mL (56 Units total) under the skin daily.     levothyroxine 25 MCG tablet  Commonly known as:  SYNTHROID, LEVOTHROID  Take 1 tablet (25 mcg total) by mouth daily.     losartan 100 MG tablet  Commonly known as:  COZAAR  Take 1 tablet (100 mg total) by mouth daily.     pen needle, diabetic 32 gauge x 5/32 Ndle  Commonly known as:  BD ULTRA-FINE NANO PEN NEEDLE  BID as directed w/ insulin     UNABLE TO FIND  Xtendi     vitamins A,C,E-zinc-copper [phone] unit-mg-unit tablet  Commonly known as:  PRESERVISION AREDS  1 tab BID     XTANDI 40 mg capsule  Generic drug:  enzalutamide  Take 1 capsule (40 mg total) by mouth daily.            Allergies   Allergen Reactions   ??? Amlodipine Swelling   ??? Topiramate Swelling   ??? Arb-Angiotensin Receptor Antagonist Other (See Comments)     Hyperkalemia   ??? Zolpidem Other (See Comments)     Hangover        I have reviewed and addressed the patient???s adherence and response to prescribed medications. I have identified patient barriers to following the proposed medication and treatment plan, and have noted opportunities to optimize healthy behaviors. I have answered the patient???s questions to satisfaction and the patient voices understanding.          ROS: negative for vision changes, CP, abdominal pain, bowel/bladder changes, bleeding, lower extremity edema    O:  Vital Signs:    Vitals:    09/13/18 0940 09/13/18 1007   BP: 145/66 130/52   Pulse: 79    SpO2: 97%    Weight: 93.7 kg (206 lb 9.6 oz)    Height: 172.7 cm (5' 8)        Physical Exam  General Appearance:    Alert, cooperative, no distress, appears stated age   Head:    Normocephalic, without obvious abnormality, atraumatic   Eyes:     Ears:     Throat:   Lips, mucosa, and tongue normal   Neck:   Supple, symmetrical, trachea midline,      No JVD bilaterally.   Lungs:     Clear to auscultation bilaterally, respirations unlabored   Chest Wall:    No tenderness or deformity    Heart:    Regular rate and rhythm, S1 and S2 normal, no murmur, rub    or gallop   Abdomen:     Soft,  non-tender       Extremities:   Extremities normal, atraumatic, no cyanosis or edema   Psych :  No agitation, anxiety, or depressed mood.

## 2018-09-15 NOTE — Unmapped (Addendum)
[facility] FOLLOW UP VISIT    REFERRING PROVIDER: Chancy Hurter, ANP  [address]  Surgery  [id number] [facility]  [place], [place] [address]    PRIMARY CARE PROVIDER: [name], MD    PATIENT PROFILE: Michael Stokes is a 83 y.o. male (DOB: 12/25/1934) who is seen in consultation at the request of Dr. Shea Evans for evaluation of chronic diarrhea.      ASSESSMENT AND PLAN:     82 y.o. male with PMHx of metastatic prostate cancer, HTN, DMT2, CKD and GERD who is being seen for follow up for chronic diarrhea.  Prior work-up revealed undiagnosed hypothyroidism with an elevated TSH and low free T4.  He has since been started on levothyroxine and to cut out diet green tea with return of his bowel movements to his baseline.  His previously elevated inflammatory markers may be due to his untreated hypothyroidism at the time, known metastatic prostate cancer or may be a sign of underlying colonic inflammation.  Given that his symptoms have improved, I do not believe he needs a colonoscopy for evaluation for microscopic colitis at this time.  We will plan to repeat his inflammatory markers today and if they remain elevated, will reach out to his oncologist to see if this could be due to his prostate cancer.    Plan:  - continue hypothyroidism treatment  - repeat ESR/CRP  - continue psyllium daily to help bulk up stool; can uptitrate to twice daily  - return to clinic PRN       Patient was seen and discussed with Dr. Carmon Sails who is in agreement with above assessment and plan.      Jackelyn Hoehn, MD  Gastroenterology Fellow, PGY-5  [facility], [place]      I was present with resident during the history and exam. I discussed the findings, assessment and plan with the resident and agree with the findings and plan as documented in the resident???s note. Barton Fanny MD        SUBJECTIVE:     CHIEF COMPLAINT: diarrhea    HISTORY OF PRESENT ILLNESS:      Michael Stokes is a 83 y.o. male (DOB: [birth year]) with PMHx of metastatic prostate cancer, HTN, DMT2, CKD and GERD who is being seen for follow up on chronic diarrhea.  He was initially seen on 07/15/18, at which time he reported onset of diarrhea in mid-June 2019.  He reported 3-4 loose, watery BMs a day with increased urgency, usually after meals  He initially tried loperamide which helped improve the frequency of his stools from 3-4 loose BMs a day to about 2-3 BMs a day.  He endorsed constipation occasionally after taking the imodium, and therefore has been intermittently wary of the loperamide. His enzalutamide was held for a week without improvement in his diarrhea and has since been restarted at 40mg  daily.  He was also recommended to stop drinking diet green tea to see if this was the source of his change in stool consistency, however did not noticed a difference.  We recommended using imodium as needed.  His laboratory workup revealed elevated an ESR/CRP, but more importantly an elevated TSH to 59 and low free T4 of 0.53.  He was recommended to follow up with his PCP to address his hypothyroidism and monitor bowel habits, with plans to repeat his inflammatory markers after initiation of thyroid treatment.  He has since been started on levothyroxine daily about one  month ago and reported improvement in his diarrhea to his PCP with use of imodium.    Today he reports, resolution of his diarrhea with cutting out diet green diet and treatment with levothyroxine.  He is now back to having 2-3 semi-formed BMs a day.  He denies urgency or fecal incontinence.  He has been taking 1 capful of psyllium a day.  He notes that his weight has remained stable and his HgbA1c has improved to 5.8 (8.0 earlier this year).      REVIEW OF SYSTEMS:   The balance of 12 systems reviewed is negative except as noted in the HPI.     PAST MEDICAL HISTORY:  Past Medical History:   Diagnosis Date   ??? Cancer with pulmonary metastases (CMS-HCC)    ??? Chronic kidney disease    ??? Depression    ??? Diabetes Mellitus, uncontrolled 06/06/2009   ??? GERD (gastroesophageal reflux disease)    ??? Hypertension    ??? Hypertension 06/06/2009   ??? Peripheral neuropathy      Patient Active Problem List    Diagnosis Date Noted   ??? Enrolled in chronic care management 09/09/2016   ??? Gout 08/03/2015   ??? CKD stage 3 due to type 2 diabetes mellitus (CMS-HCC) 03/05/2015   ??? Durable power of attorney for healthcare exists but copy not available 07/26/2014   ??? Kyphosis, acquired 07/01/2013   ??? Dupuytren's contracture of both hands 06/16/2013   ??? Rotator cuff tear 06/16/2013   ??? Cancer with pulmonary metastases (CMS-HCC)    ??? Malignant neoplasm of prostate (CMS-HCC) 03/09/2012   ??? Polyneuropathy in diabetes (CMS-HCC) 08/03/2009   ??? Diabetes 06/06/2009   ??? Hypertension 06/06/2009   ??? Hypercholesterolemia 06/06/2009       PAST SURGICAL HISTORY:  Past Surgical History:   Procedure Laterality Date   ??? pros     ??? PROSTATECTOMY  2001   ??? PROSTATECTOMY         MEDICATIONS:  Current Outpatient Medications   Medication Sig Dispense Refill   ??? allopurinol (ZYLOPRIM) 100 MG tablet Take 0.5 tablets (50 mg total) by mouth daily. 180 tablet 3   ??? amitriptyline (ELAVIL) 25 MG tablet TAKE 1 TABLET(25 MG) BY MOUTH EVERY NIGHT 90 tablet 3   ??? aspirin (ENTERIC COATED ASPIRIN) 81 MG tablet Take 1 tablet (81 mg total) by mouth daily. 100 tablet 11   ??? atorvastatin (LIPITOR) 40 MG tablet Take 1 tablet (40 mg total) by mouth daily. 90 tablet 3   ??? blood sugar diagnostic (FREESTYLE LITE STRIPS) Strp by Other route Two (2) times a day. 100 strip 11   ??? carvedilol (COREG) 25 MG tablet Take 2 tablets (50 mg total) by mouth Two (2) times a day. 120 tablet 11   ??? cholecalciferol, vitamin D3, 1,000 unit tablet 2000u daily 30 tablet 11   ??? enzalutamide (XTANDI) 40 mg capsule Take 1 capsule (40 mg total) by mouth daily. 90 capsule 0   ??? hydrALAZINE (APRESOLINE) 25 MG tablet Take 1 tablet (25 mg total) by mouth Three (3) times a day. 120 tablet 11   ??? insulin ASPART (NOVOLOG FLEXPEN) 100 unit/mL (3 mL) injection pen 8u prior to breakfast and lunch, 9u prior to dinner 15 mL 3   ??? insulin glargine (LANTUS SOLOSTAR U-100 INSULIN) 100 unit/mL (3 mL) injection pen Inject 0.56 mL (56 Units total) under the skin daily. 15 mL 5   ??? levothyroxine (SYNTHROID, LEVOTHROID) 25 MCG tablet Take 1 tablet (25  mcg total) by mouth daily. 90 tablet 3   ??? losartan (COZAAR) 100 MG tablet Take 1 tablet (100 mg total) by mouth daily. 90 tablet 3   ??? pen needle, diabetic (BD ULTRA-FINE NANO PEN NEEDLE) 32 gauge x 5/32 Ndle BID as directed w/ insulin 200 each 3   ??? venlafaxine (EFFEXOR XR) 75 MG 24 hr capsule Take 1 capsule (75 mg total) by mouth daily. 30 capsule 2   ??? vitamins  A,C,E-zinc-copper (PRESERVISION AREDS) [id number] unit-mg-unit tablet 1 tab BID  0   ??? UNABLE TO FIND Xtendi       No current facility-administered medications for this visit.        ALLERGIES:  Amlodipine; Topiramate; Arb-angiotensin receptor antagonist; and Zolpidem    FAMILY HISTORY:  family history includes Cancer in his father and mother; Cystic fibrosis in his daughter; Deep vein thrombosis in his daughter.    SOCIAL HISTORY:  Social History     Tobacco Use   ??? Smoking status: Former Smoker     Packs/day: 0.50     Years: 15.00     Pack years: 7.50     Last attempt to quit: 11/09/1981     Years since quitting: 36.8   ??? Smokeless tobacco: Never Used   Substance Use Topics   ??? Alcohol use: No     Alcohol/week: 0.0 standard drinks   ??? Drug use: No       OBJECTIVE:   VITAL SIGNS: BP 140/65  - Pulse 85  - Ht 172.7 cm (5' 8)  - Wt 93.7 kg (206 lb 8 oz)  - BMI 31.40 kg/m??   Wt Readings from Last 6 Encounters:   09/16/18 93.7 kg (206 lb 8 oz)   09/13/18 93.7 kg (206 lb 9.6 oz)   08/27/18 93.8 kg (206 lb 12.8 oz)   08/19/18 93.8 kg (206 lb 11.2 oz)   07/15/18 94.4 kg (208 lb 1.6 oz)   07/07/18 94.2 kg (207 lb 9.6 oz)       PHYSICAL EXAM:  Constitutional: Alert, Oriented x 3, No acute distress, well nourished and well hydrated; appears younger than stated age  HEENT: PERRL, conjunctiva clear, anicteric, oropharynx clear, neck supple.   CV: Regular rate and rhythm, normal S1, S2.  Lung: Clear to auscultation bilaterally. Unlabored breathing.   Abdomen: Soft, normal bowel sounds, non-distended, non-tender.   Rectal: good rectal tone with soft, brown stool in the vault  Extremities: Warm, no edema.  MSK: No joint swelling or tenderness noted.   Skin: No rashes, jaundice or skin lesions.  Neuro: No focal deficits. Normal gait.  Mental Status: Thought organized, appropriate affect, pleasantly interactive, not anxious appearing.     DIAGNOSTIC STUDIES:  I have reviewed all pertinent diagnostic studies, including:    GI Procedures:  Colonoscopy in 2007: do not have access to report    Radiographic studies:  none    Laboratory results:  Lab Results   Component Value Date    WBC 9.1 09/13/2018    HGB 11.1 (L) 09/13/2018    HCT 34.0 (L) 09/13/2018    PLT 212 09/13/2018     Lab Results   Component Value Date    NA 138 09/13/2018    K 4.6 09/13/2018    CL 105 09/13/2018    CO2 23.0 09/13/2018    BUN 33 (H) 09/13/2018    CREATININE 2.10 (H) 09/13/2018    CALCIUM 9.2 09/13/2018    CALCIUM 9.2 09/13/2018  MG 1.9 03/26/2017    PHOS 3.2 09/13/2018     Lab Results   Component Value Date    ALKPHOS 104 08/19/2018    BILITOT 0.7 08/19/2018    PROT 6.1 (L) 08/19/2018    ALBUMIN 3.9 09/13/2018    ALT 23 08/19/2018    AST 29 08/19/2018    GGT 37 03/03/2013     Lab Results   Component Value Date    INR 1.0 08/15/2015

## 2018-09-16 ENCOUNTER — Ambulatory Visit
Admit: 2018-09-16 | Discharge: 2018-09-17 | Payer: PRIVATE HEALTH INSURANCE | Attending: Gastroenterology | Primary: Gastroenterology

## 2018-09-16 DIAGNOSIS — R197 Diarrhea, unspecified: Secondary | ICD-10-CM | POA: Diagnosis not present

## 2018-09-16 LAB — ERYTHROCYTE SEDIMENTATION RATE: Lab: 56 — ABNORMAL HIGH

## 2018-09-16 LAB — C-REACTIVE PROTEIN: C reactive protein:MCnc:Pt:Ser/Plas:Qn:: 19.4 — ABNORMAL HIGH

## 2018-09-16 NOTE — Unmapped (Addendum)
-   I am happy to hear that your diarrhea and urgency has improved with treatment of your hypothyroidism and avoidance of diet green tea  - please obtain the blood work we have ordered to ensure that your inflammatory markers have improved  - continue the psyllium once daily   - you can increase this to twice daily if you would like to bulk up your stools more  - return to clinic as needed      GI Clinic Appointments:  [phone]  Please call the GI Clinic appointment line if you need to schedule, reschedule or cancel an appointment in clinic.  They can also answer any questions you may have about where your appointment is located and when you need to arrive.      GI Procedure Appointments:  [phone]  Please call the GI Procedures line if you need to schedule, reschedule or cancel ANY type of procedure (EGD, colonoscopy, motility testing, etc).  You can also call this number for prep instructions, etc.      Radiology: [phone], option 3 or 4  If you are being scheduled for any type of radiology, you will need to call to receive your appointment time.  Please call this number for information.     For emergencies after normal business hours or on weekends/holidays   Proceed to the nearest emergency room OR contact the [facility] Operator at [phone] who can page the Gastroenterology Fellow on call.      Nurse Contacts:   [place]  Phone: [phone]  Fax: [phone]    Jackelyn Hoehn, M.D.

## 2018-09-28 DIAGNOSIS — E118 Type 2 diabetes mellitus with unspecified complications: Secondary | ICD-10-CM | POA: Diagnosis not present

## 2018-09-28 DIAGNOSIS — Z794 Long term (current) use of insulin: Secondary | ICD-10-CM | POA: Diagnosis not present

## 2018-09-28 DIAGNOSIS — I10 Essential (primary) hypertension: Secondary | ICD-10-CM | POA: Diagnosis not present

## 2018-09-28 NOTE — Unmapped (Signed)
Chronic Care Management Note    Michael Stokes is a 83 y.o. male    PCP Action Items: n/a    Care Manager Action Item: n/a    Review last clinic note/s with patient.   1. DM2 - remains controlled, continue to follow closely for lows, continue Lantus 48u nightly and Novolog 8,8,9u.  Phone call 2 wks to assess.  Reassess a1c 3 mo.  ACR, retinal exam UTD  2. htn - recheck 130/52 (hctz recently d/cd, started hydralazine 25mg  TID, tolerating).  Continue regimen    Diabetes:  Lab Results   Component Value Date    A1C 5.8 09/13/2018       The Diabetes Enhanced Care Team spoke to [facility] to follow up on diabetes and hypertension.    Pt reports a diabetes regimen of:  - Lantus 48 units nightly  - Novolog 8, 8, 9    Missed doses? no  Hypos? Most recent low was 09/02/18, BG of 49 due to not eating lunch. Patient reports he did take mealtime insulin that day. No lows since then.     Pt reports blood sugars of:  Date Before breakfast   11/19 77   11/18 104   11/15 88   11/13 71   11/11 80   11/9 65   11/7 99   11/4 119     Patient reports that he does not miss doses of lantus or novolog. Uses novolog TID.     Hypertension:   Patient is taking hydralazine 25 mg TID. No side effects.     Follow Up:  Any recent visits to outside providers? (Urgent Care, ER, Hospital)? no  Need any refills?  Yes, Novolog pen. Patient reports that he was unable to pick up refill. Told that insurance could not cover until 10/05/18. Reached out to pharmacy and insurance company. Refill cannot be covered until 10/01/18 given patient's current dosage.     Is the patient due for Healthcare Maintenance?  no  -- If so, please ask to schedule an appointment for maintenance.    Does the patient need an appointment to be seen in clinic? no  Any outstanding orders that need to be addressed? no  Does patient have transportation to upcoming appointments? yes  Are there any other barriers this patient faces to receiving adequate medical care? no    Patient plan of care was reviewed. Any changes/updates needed were addressed.    The patient was discussed with Arby Barrette, PA and the following changes to his/her medications were made:  - Continue current regimen  - Do not take Novolog without eating  - 1 wk f/u    All medical decisions were made by Arby Barrette, PA.

## 2018-09-29 NOTE — Unmapped (Signed)
I was the supervising provider for the service delivered.    Michael Stokes

## 2018-09-30 ENCOUNTER — Ambulatory Visit: Admit: 2018-09-30 | Discharge: 2018-10-01 | Payer: PRIVATE HEALTH INSURANCE

## 2018-09-30 ENCOUNTER — Ambulatory Visit
Admit: 2018-09-30 | Discharge: 2018-10-01 | Payer: PRIVATE HEALTH INSURANCE | Attending: Nurse Practitioner | Primary: Nurse Practitioner

## 2018-09-30 DIAGNOSIS — C61 Malignant neoplasm of prostate: Secondary | ICD-10-CM | POA: Diagnosis not present

## 2018-09-30 LAB — CBC W/ AUTO DIFF
BASOPHILS ABSOLUTE COUNT: 0 10*9/L (ref 0.0–0.1)
BASOPHILS RELATIVE PERCENT: 0.6 %
EOSINOPHILS ABSOLUTE COUNT: 0.4 10*9/L (ref 0.0–0.4)
EOSINOPHILS RELATIVE PERCENT: 5.1 %
HEMOGLOBIN: 11.1 g/dL — ABNORMAL LOW (ref 13.5–17.5)
LARGE UNSTAINED CELLS: 2 % (ref 0–4)
LYMPHOCYTES ABSOLUTE COUNT: 1.4 10*9/L — ABNORMAL LOW (ref 1.5–5.0)
LYMPHOCYTES RELATIVE PERCENT: 18 %
MEAN CORPUSCULAR HEMOGLOBIN CONC: 32.7 g/dL (ref 31.0–37.0)
MEAN CORPUSCULAR HEMOGLOBIN: 30.9 pg (ref 26.0–34.0)
MEAN CORPUSCULAR VOLUME: 94.4 fL (ref 80.0–100.0)
MEAN PLATELET VOLUME: 7.8 fL (ref 7.0–10.0)
MONOCYTES ABSOLUTE COUNT: 0.5 10*9/L (ref 0.2–0.8)
NEUTROPHILS ABSOLUTE COUNT: 5.1 10*9/L (ref 2.0–7.5)
NEUTROPHILS RELATIVE PERCENT: 67.9 %
PLATELET COUNT: 189 10*9/L (ref 150–440)
RED BLOOD CELL COUNT: 3.59 10*12/L — ABNORMAL LOW (ref 4.50–5.90)
RED CELL DISTRIBUTION WIDTH: 14.6 % (ref 12.0–15.0)
WBC ADJUSTED: 7.5 10*9/L (ref 4.5–11.0)

## 2018-09-30 LAB — COMPREHENSIVE METABOLIC PANEL
ALBUMIN: 3.7 g/dL (ref 3.5–5.0)
ALKALINE PHOSPHATASE: 117 U/L (ref 38–126)
ALT (SGPT): 35 U/L (ref <50)
AST (SGOT): 42 U/L (ref 19–55)
BILIRUBIN TOTAL: 0.7 mg/dL (ref 0.0–1.2)
BUN / CREAT RATIO: 13
CALCIUM: 9.1 mg/dL (ref 8.5–10.2)
CHLORIDE: 105 mmol/L (ref 98–107)
CO2: 25 mmol/L (ref 22.0–30.0)
CREATININE: 1.9 mg/dL — ABNORMAL HIGH (ref 0.70–1.30)
EGFR CKD-EPI AA MALE: 37 mL/min/{1.73_m2} — ABNORMAL LOW (ref >=60)
EGFR CKD-EPI NON-AA MALE: 32 mL/min/{1.73_m2} — ABNORMAL LOW (ref >=60)
GLUCOSE RANDOM: 61 mg/dL — ABNORMAL LOW (ref 65–179)
POTASSIUM: 4.4 mmol/L (ref 3.5–5.0)
PROTEIN TOTAL: 6.1 g/dL — ABNORMAL LOW (ref 6.5–8.3)
SODIUM: 139 mmol/L (ref 135–145)

## 2018-09-30 LAB — PROSTATE SPECIFIC ANTIGEN: Prostate specific Ag:MCnc:Pt:Ser/Plas:Qn:: <0.1

## 2018-09-30 LAB — ALBUMIN: Albumin:MCnc:Pt:Ser/Plas:Qn:: 3.7

## 2018-09-30 LAB — MACROCYTES

## 2018-09-30 NOTE — Unmapped (Signed)
Blood sugar by POC meter completed at 1034am. Results- 34 mg/dl. Patient provided with 8 oz apple juice and peanut butter crackers. Patient took two of his home glucose tablets. Patient denies symptoms.   10:45- glucose recheck 49 mg/dl. Spoke with Barkley Bruns, stated to give more apple juice and re-check glucose in 15 minutes. 8 oz apple juice provided and a granola bar. Patient continues to be asymptomatic.  11:00- glucose recheck= 71 mg/dl. Barkley Bruns notified.

## 2018-09-30 NOTE — Unmapped (Signed)
CC: f/u prostate cancer    A&P:    1.  mCRPC:    ?? Lupron 22.5 mg due again on or after 11/17/2018  ?? Rec daily calcium and vitamin D  ?? Continue good self care  ?? Continue enzalutamide 40 mg daily (one tab/day)  ?? Excellent PSA response      2. HTN    ?? Continue carvedilol to 50 mg BID.   ?? Continue HCTZ and losartan  ?? Maintain blood pressure log at home    3. Diarrhea    ?? Resolved  ?? F/u with GI PRN      4. CKD w/ SCr rise    ?? RUS ok  ?? Stable today; f/u with nephrology    5. Abnormal TSH    ?? Now being followed by our Int Med colleagues    6. Hypoglycemia    ?? Completely asymptomatic  ?? After carbs and to glucose tabs, his glucose came back up to 71  ?? He will monitor at home, and notify Mr. Lorin Picket if he continues to have abnormal readings  ?? He took his insulin this morning without eating; reviewed that he should not take his short acting insulin without eating        RTC 6 weeks for labs, clinic visit, Lupron.      Approximately 30 minutes was spent in the care of this patient, half in education and counseling.  A copy of the AVS was provided to the patient which includes key points from our visit as well as our contact information.      Albertina Parr, NP-C  Adult Nurse Practitioner  Urology & Medical Oncology      Onc history:    --Status post a radical prostatectomy in 2001 in [place].   --He had salvage radiotherapy in 2002 and had  been followed with a gradually rising PSA. He was found to have multiple pulmonary nodules, but no bone metastases.   --Lupron was started in early 2009 with dramatic reduction in his pulmonary disease.    --His PSA has been rising on ADT, so Casodex (2010, PSA 9.6) was started and subsequently Zytiga (04/2011, for sx C7 lesion without PSA rise).  --XRT C4-T2 2012 before starting Zytiga  --He discontinued Zytiga because he had substantial weight gain on Zytiga and Prednisone.   --He also discontinued Xtandi (started in 05/2012) because of fatigue and rash, joint swelling.    In looking back through his notes it looks like his Lupron was stoppped when he started casodex in 2010    Despite the side effects, he had a sustained biochemical response. He fractured a vertebrae falling off of a ladder and now has chronic back pain.    --Restarted primary ADT with Lupron 22.5 mg in 03/2016  --PSA rise; Scans 11/2017 with increased size of 1 pulm nodule and LAN  --Started Xtandi end of 11/2017 at a reduced dose of 80 mg in an effort to minimize SE        HISTORY OF PRESENT ILLNESS:     Michael Stokes returns today for scheduled followup.    Today, he reports    --his diarrhea has resolved. Bowel movements are mostly normal in frequency and consistency  --taking enzaluatmide 40 mg daily    His serum glucose was 61  Repeat POCT glucose was 34. Eventually came back up to 71. He is completely asymptomatic     No n/v, headaches, SOB, cough, CP, LUTS, hematuria, LE  edema    No LUTS, bone pain, weight loss    BPs at home [id number]/68-82          Allergies   Allergen Reactions   ??? Amlodipine Swelling   ??? Topiramate Swelling   ??? Arb-Angiotensin Receptor Antagonist Other (See Comments)     Hyperkalemia   ??? Zolpidem Other (See Comments)     Hangover     Meds: Updated in EPIC    10 pt ROS reviewed and negative except per HPI           VITAL SIGNS:          Vitals:    09/30/18 0909   BP: 147/65   Pulse: 78   Resp: 17   Temp: 36.8 ??C (98.2 ??F)   SpO2: 97%     GENERAL: Pleasant, NAD.   VS: Reviewed and unremarkable  Head: normocephalic  Eyes: sclera aniecteric  Pulm: Non labored WOB; CTA bil  CV: RRR; no MRG  Abd: Doft, NT, BS x 4, normoactive  Skin: Warm, dry  Msk: No Le edema  NEURO: A&O x 3      Data:    CT CAP: 5/17  IMPRESSION:   1. New retroperitoneal and pelvic adenopathy concerning for metastatic disease.  2. Pulmonary nodules, at least one of which is slightly increased in size, and a few of which are new compared to prior, concerning for metastatic disease.    Bone scan: 5/17  IMPRESSION:   -Grossly unchanged focal uptake in the right aspect of C7. No new osseous lesion identified.    CT CAP 11/2017    Impression     - Increased size of right lower lobe pulmonary nodule.    - Enlarged aortocaval lymph node now measuring 1.7 cm. Decreased size of previously seen retrocaval and external iliac lymph nodes.       Bone scan 11/2017    Impression     Increased focal uptake at C7 compared to most recent bone scan (03/20/2016) is concerning for recurrent disease, though is indeterminate given known history of metastatic lesion at this site.     Renal ultrasound 08/16/18    Impression       --Atrophic, echogenic right kidney, similar to prior CT.    --Mildly lobulated left renal contour. Otherwise, unremarkable sonographic appearance of the left kidney.         Lab Results   Component Value Date    PSA <0.10 09/30/2018    PSA <0.10 08/19/2018    PSA <0.10 07/01/2018    PSA <0.10 05/20/2018    PSA <0.10 03/18/2018    PSA 0.17 02/15/2018     03/27/2015 = 3.9     PSA Diagnostic   Date Value Ref Range Status   12/25/2014 3.9 0.0 - 4.0 ng/mL Final   10/20/2014 1.7 0.0 - 4.0 ng/mL Final   09/18/2014 2.1 0.0 - 4.0 ng/mL Final   06/12/2014 1.3 0.0 - 4.0 ng/mL Final   03/09/2014 0.5 0.0 - 4.0 ng/mL Final   12/08/2013 0.2 0.0 - 4.0 ng/mL Final   09/08/2013 <0.1 0.0 - 4.0 ng/mL Final   06/09/2013 <0.1 0.0 - 4.0 ng/mL Final   03/03/2013 <0.1 0.0 - 4.0 ng/mL Final   12/02/2012 <0.1 0.0 - 4.0 NG/ML Final   08/26/2012 <0.1 0.0 - 4.0 NG/ML Final   05/28/2012 <0.1 0.0 - 4.0 NG/ML Final   02/26/2012 <0.1 0.0 - 4.0 NG/ML Final   12/04/2011 <0.1 0.0 - 4.0 NG/ML Final  07/24/2011 1.4 0.0 - 4.0 NG/ML Final   04/17/2011 3.0 0.0 - 4.0 NG/ML Final   01/09/2011 3.1 0.0 - 4.0 NG/ML Final     2010-10-10 PSA, DIAGNOSTIC 2.3 NG/ML 0.0-4.0    2010-06-20 PSA, DIAGNOSTIC 1.2 NG/ML 0.0-4.0    2010-01-24 PSA, DIAGNOSTIC 1.1 NG/ML 0.0-4.0           His PSA increased previously to 9.6 . In 04/26/09 it was 3.7, 01/18/09 it was 1.2.

## 2018-09-30 NOTE — Unmapped (Addendum)
Lab Results   Component Value Date    PSA <0.10 08/19/2018    PSA <0.10 07/01/2018    PSA <0.10 05/20/2018    PSA <0.10 03/18/2018    PSA 0.17 02/15/2018    PSA 1.69 01/14/2018       Blood pressure medicine:    Carvedilol: 50 mg two times/day (2 tabs two times/day)  Hydrochlorathiazide: 50 mg/day  Losartaan: 100 mg/day    ?? Continue the Xtandi (1 pill/day)    ?? Follow up with your other providers as planned    ?? Come back in 6 weeks for labs, visit, and Lupron    ?? Make sure to drink plenty of water    ?? I will send you a message when I get your PSA number back    Have a great holiday season!      Albertina Parr, NP-C, OCN  Adult Nurse Practitioner  Urology and Medical Oncology    If you have been prescribed a medication today, always read the package insert that comes with the medication.    In the event of an emergency, always call 911    Urology:  Main clinic & [facility]: [phone]  Fax: [phone]    [facility]:  Phone: [phone]  Fax: [phone]    After hours/nights/weekends:  Hospital Operator: [phone]    [email or web address]  [email or web address]

## 2018-09-30 NOTE — Unmapped (Addendum)
Clinical Pharmacist Practitioner: [facility]    Oral Hormonal therapy Follow-Up    Assessment and recommendations:  1. Enzalutamide monitoring and side effect management- has minimal side effects associated with taking 40 mg daily. PSA undetectable.   - Continue enzalutamide 40 mg daily    2. Hypertension- BP much better controlled on current regimen of carvedilol 25 mg BID, hydralazine 25 mg TID, and losartan 100 mg daily. He reports BP's at home between 130-140/80-90 which is consistent with today's clinic value.   - Continue current hypertensive regimen    3. Diarrhea- Felt due to green tea, GI workup negative no longer having diarrhea      4. DMT2 - on lantus and aspart, he took his insulin last night as well as this morning but did not eat breakfast after his 8 units of aspart due to running late.  -  BG 34 ate crakers and juice and repeat was 75  - Reminded him that if he doesn't eat he should not take his short acting insulin.     Follow- up: 6 months or sooner if needed    ______________________________________________________________________    Oral Hormonal therapy: Enzalutamide 40 mg daily (dose decreased due to BP)     Start date: 12/17/17    HPI: Status post a radical prostatectomy in 2001 in [place]. He had salvage radiotherapy in 2002 and had ??been followed with a gradually rising PSA. He was found to have multiple pulmonary nodules, but no bone metastases. Lupron was started in early 2009 with dramatic reduction in his pulmonary disease.His PSA has been rising on ADT, so Casodex (2010, PSA 9.6) was started and subsequently Zytiga (04/2011, for sx C7 lesion without PSA rise). He received XRT C4-T2 2012 before starting Zytiga. He discontinued Zytiga because he had substantial weight gain on Zytiga and Prednisone. He also discontinued Xtandi (started in 05/2012) because of fatigue and rash, joint swelling. In looking back through his notes it looks like his Lupron was stoppped when he started casodex in 2010. Despite the side effects, he had a sustained biochemical response. He fractured a vertebrae falling off of a ladder and now has chronic back pain. Restarted primary ADT with Lupron 22.5 mg in 03/2016. PSA rise; Scans 11/2017 with increased size of 1 pulm nodule and LAN. Started Xtandi end of 11/2017 at a reduced dose of 80 mg in an effort to minimize SE.    Interim History: Michael Stokes is feeling well. He is currently taking one capsule in the evening. He feels very well. His blood pressures are controlled at home and diarrhea resolved.     Adherence: Take 1 capsule once daily in evening no doses missed    Toxicities: Hypertenison, diarrhea    Drug-Drug Interactions: Losartan concentrations will be reduced by CYP3A4 induction by enzalutamide.     Medications:  Current Outpatient Medications   Medication Sig Dispense Refill   ??? allopurinol (ZYLOPRIM) 100 MG tablet Take 0.5 tablets (50 mg total) by mouth daily. 180 tablet 3   ??? amitriptyline (ELAVIL) 25 MG tablet TAKE 1 TABLET(25 MG) BY MOUTH EVERY NIGHT 90 tablet 3   ??? aspirin (ENTERIC COATED ASPIRIN) 81 MG tablet Take 1 tablet (81 mg total) by mouth daily. 100 tablet 11   ??? atorvastatin (LIPITOR) 40 MG tablet Take 1 tablet (40 mg total) by mouth daily. 90 tablet 3   ??? blood sugar diagnostic (FREESTYLE LITE STRIPS) Strp by Other route Two (2) times a day. 100 strip 11   ???  carvedilol (COREG) 25 MG tablet Take 2 tablets (50 mg total) by mouth Two (2) times a day. 120 tablet 11   ??? cholecalciferol, vitamin D3, 1,000 unit tablet 2000u daily 30 tablet 11   ??? enzalutamide (XTANDI) 40 mg capsule Take 1 capsule (40 mg total) by mouth daily. 90 capsule 0   ??? hydrALAZINE (APRESOLINE) 25 MG tablet Take 1 tablet (25 mg total) by mouth Three (3) times a day. 120 tablet 11   ??? insulin ASPART (NOVOLOG FLEXPEN) 100 unit/mL (3 mL) injection pen 8u prior to breakfast and lunch, 9u prior to dinner 15 mL 3   ??? insulin glargine (LANTUS SOLOSTAR U-100 INSULIN) 100 unit/mL (3 mL) injection pen Inject 0.56 mL (56 Units total) under the skin daily. (Patient taking differently: Inject 48 Units under the skin daily. ) 15 mL 5   ??? losartan (COZAAR) 100 MG tablet Take 1 tablet (100 mg total) by mouth daily. 90 tablet 3   ??? pen needle, diabetic (BD ULTRA-FINE NANO PEN NEEDLE) 32 gauge x 5/32 Ndle BID as directed w/ insulin 200 each 3   ??? venlafaxine (EFFEXOR XR) 75 MG 24 hr capsule Take 1 capsule (75 mg total) by mouth daily. 30 capsule 2   ??? vitamins  A,C,E-zinc-copper (PRESERVISION AREDS) 7,160-113-100 unit-mg-unit tablet 1 tab BID  0   ??? levothyroxine (SYNTHROID, LEVOTHROID) 25 MCG tablet Take 1 tablet (25 mcg total) by mouth daily. (Patient not taking: Reported on 09/30/2018) 90 tablet 3     No current facility-administered medications for this visit.      I spent 10 minutes with Michael Stokes in direct patient care.     Michael Stokes PharmD, BCOP, CPP  Hematology/Oncology Pharmacist  P: [phone]

## 2018-10-06 MED ORDER — ENZALUTAMIDE 40 MG CAPSULE
ORAL_CAPSULE | Freq: Every day | ORAL | 2 refills | 0 days | Status: CP
Start: 2018-10-06 — End: ?
  Filled 2018-10-20: qty 30, 30d supply, fill #0

## 2018-10-14 DIAGNOSIS — E11649 Type 2 diabetes mellitus with hypoglycemia without coma: Secondary | ICD-10-CM | POA: Diagnosis not present

## 2018-10-14 DIAGNOSIS — I10 Essential (primary) hypertension: Secondary | ICD-10-CM | POA: Diagnosis not present

## 2018-10-14 NOTE — Unmapped (Signed)
[facility] Refill Coordination Note    Specialty Medication(s) to be Shipped:   Hematology/Oncology: Michael Stokes, DOB: 04-Sep-1935  Phone: [phone] (home)       All above HIPAA information was verified with patient.     Completed refill call assessment today to schedule patient's medication shipment from the [facility] [phone]).       Specialty medication(s) and dose(s) confirmed: Regimen is correct and unchanged.   Changes to medications: Michael Stokes reports no changes reported at this time.  Changes to insurance: No  Questions for the pharmacist: No    The patient will receive a drug information handout for each medication shipped and additional FDA Medication Guides as required.      DISEASE/MEDICATION-SPECIFIC INFORMATION        N/A    ADHERENCE     Medication Adherence    Patient reported X missed doses in the last month:  0  Specialty Medication:  Xtandi  Patient is on additional specialty medications:  No  Patient is on more than two specialty medications:  No  Any gaps in refill history greater than 2 weeks in the last 3 months:  no  Demonstrates understanding of importance of adherence:  yes  Informant:  patient  Reliability of informant:  reliable      Adherence tools used:  medication list   Other adherence tool:  routine       Confirmed plan for next specialty medication refill:  delivery by pharmacy          Refill Coordination    Has the Patients' Contact Information Changed:  No  Is the Shipping Address Different:  No         MEDICARE PART B DOCUMENTATION     Xtandi 40mg : Patient has 10 days worth on hand.    SHIPPING     Shipping address confirmed in Epic.     Delivery Scheduled: Yes, Expected medication delivery date: 10/21/2018 via UPS or courier.     Medication will be delivered via UPS to the home address in Epic WAM.    Jorje Guild   [facility] Specialty Technician

## 2018-10-14 NOTE — Unmapped (Signed)
The Internal Medicine Enhanced Care team was unable to reach Michael Stokes to follow-up in regards to the Chronic Care Management Program. A voicemail was left with a request to return our call. This is our first attempt to contact patient.

## 2018-10-19 NOTE — Unmapped (Signed)
Chronic Care Management Note    Michael Stokes is a 83 y.o. male    PCP Action Items: none    Care Manager Action Item: none    Review last clinic note/s with patient.   1. DM2 - remains controlled, continue to follow closely for lows, continue Lantus 48u nightly and Novolog 8,8,9u.  Phone call 2 wks to assess.  Reassess a1c 3 mo.  ACR, retinal exam UTD  2. htn - recheck 130/52 (hctz recently d/cd, started hydralazine 25mg  TID, tolerating).  Continue regimen    Diabetes:  Lab Results   Component Value Date    A1C 5.8 09/13/2018       The Diabetes Enhanced Care Team spoke to [name] to follow up on diabetes and hypertension.    Pt reports a diabetes regimen of:  - Lantus 48 units nightly  - Novolog 8, 8, 9 with meals    Missed doses? no  Hypos? No. Patient reports no symptoms of hypoglycemia.    Pt reports blood sugars of:  Date Before breakfast   12/1 67   12/2 80   12/3 84   12/5 58   12/6 52   12/7 63   12/8 100   12/9 110     Patient recalls no changes to routine or diet that may account for higher BGs on 12/8 and 12/9.     Hypertension:  Patient is currently taking hydralazine 25 mg TID.     Follow Up:    Any recent visits to outside providers? (Urgent Care, ER, Hospital)? no  Need any refills?  no    Need any medical equipment?  no    Is the patient due for Healthcare Maintenance?  no  -- If so, please ask to schedule an appointment for maintenance.    Does the patient need an appointment to be seen in clinic? no  Any outstanding orders that need to be addressed? no  Does patient have transportation to upcoming appointments? yes  Are there any other barriers this patient faces to receiving adequate medical care? no    Patient plan of care was reviewed. Any changes/updates needed were addressed.    The patient was discussed with Laural Benes, PA and the following changes to his/her medications were made:  - Decrease Lantus from 48 units to 40 units nightly  - Continue Novolog 8, 8, 9 with meals  - Check post-prandial sugars     All medical decisions were made by Laural Benes, PA.

## 2018-10-19 NOTE — Unmapped (Signed)
The Internal Medicine Enhanced Care team was unable to reach [name] to follow-up in regards to the Chronic Care Management Program. Spoke with patient's spouse. Patient is not home at the time. CA will try again at a later time. This is our second attempt to contact patient.

## 2018-10-20 MED FILL — XTANDI 40 MG CAPSULE: 30 days supply | Qty: 30 | Fill #0 | Status: AC

## 2018-10-21 NOTE — Unmapped (Signed)
I was the supervising provider for the service delivered.    Jillene Bucks

## 2018-10-25 NOTE — Unmapped (Signed)
The Internal Medicine Enhanced Care team was unable to reach Michael Stokes to follow-up in regards to their recent telephone encounter with our Diabetes Team. A voicemail was left with a request to return our call. This is our first attempt to contact patient.    During last telephone call, patient was instructed to:  - Decrease Lantus from 48 units to 40 units nightly  - Continue Novolog 8, 8, 9 with meals  - Check post-prandial sugars

## 2018-10-27 NOTE — Unmapped (Signed)
Medical Decisions:  The patient was discussed with Arby Barrette, PA-C and the following changes to his/her diabetes care were made:  - Decrease Lantus from 40 units to 38 units nightly  - Continue Novolog 8, 8, 9    Patient will monitor: fasting.    The Internal Medicine Enhanced Care team was unable to reach Michael Stokes to follow-up in regards to their recent telephone encounter with our Diabetes Team. A voicemail was left with a request to return our call. This is our first attempt to contact patient.    All medical decisions were made by Arby Barrette, PA-C .    ----------------------------------------------------------------------------------------------------------------------  Diabetes phone call note    Michael Stokes is a 83 y.o. male  Lab Results   Component Value Date    A1C 5.8 09/13/2018       PCP: [name], MD  Future visit: 11/24/2018 with PCP   Retinal Eye Exam: up to date in Epic  Foot Exam: Up to date in EPIC. Reminded patient that they need to wear foot wear at all times.     The Diabetes Enhanced Care Team spoke to Michael Stokes on October 27, 2018 to follow up on patient's diabetic regimen and recent blood sugar readings.    During last telephone call, patient was instructed to:  - Decrease Lantus from 48 units to 40 units nightly  - Continue Novolog 8, 8, 9 with meals  - Check post-prandial sugars??    Patient reports diabetes regimen of:  - Lantus 40 units nightly  - Novolog 8, 8, 9 with meals     Missed doses? no   Skipping doses to make them last longer? no    Pt reports blood sugars of:  Date Before breakfast Before lunch After dinner   12/10 - - 163   12/11 97 - -   12/12 67 - -   12/13 113 - -   12/14 104 92 -   12/16 84 - -   12/17 74 - -   12/18 117 - -     Hypos? no

## 2018-10-28 NOTE — Unmapped (Signed)
I was the supervising provider for the service delivered.    Jillene Bucks

## 2018-10-28 NOTE — Unmapped (Signed)
Medical Decisions:  The patient was discussed with Arby Barrette, PA-C and the following changes to his/her diabetes care were made:  - Decrease Lantus from 40 units to 38 units nightly  - Continue Novolog 8, 8, 9    Patient will monitor: fasting.    Patient understands diagnosis and treatment plan. Patient voices understanding. Plan is consistent with the patient???s preferences and goals.    All medical decisions were made by Arby Barrette, PA-C .

## 2018-10-28 NOTE — Unmapped (Signed)
The Internal Medicine Enhanced Care team was unable to reach [name] to follow-up in regards to their recent telephone encounter with our Diabetes Team. Spoke with patient's wife, who reports patient will be home in the afternoon. This is our second attempt to contact patient.

## 2018-11-18 ENCOUNTER — Ambulatory Visit
Admit: 2018-11-18 | Discharge: 2018-11-18 | Payer: PRIVATE HEALTH INSURANCE | Attending: Nurse Practitioner | Primary: Nurse Practitioner

## 2018-11-18 ENCOUNTER — Other Ambulatory Visit: Admit: 2018-11-18 | Discharge: 2018-11-18 | Payer: PRIVATE HEALTH INSURANCE

## 2018-11-18 DIAGNOSIS — E079 Disorder of thyroid, unspecified: Secondary | ICD-10-CM | POA: Diagnosis not present

## 2018-11-18 DIAGNOSIS — Z5111 Encounter for antineoplastic chemotherapy: Secondary | ICD-10-CM | POA: Diagnosis not present

## 2018-11-18 DIAGNOSIS — C61 Malignant neoplasm of prostate: Secondary | ICD-10-CM | POA: Diagnosis not present

## 2018-11-18 DIAGNOSIS — R7989 Other specified abnormal findings of blood chemistry: Secondary | ICD-10-CM

## 2018-11-18 LAB — COMPREHENSIVE METABOLIC PANEL
ALBUMIN: 3.8 g/dL (ref 3.5–5.0)
ALKALINE PHOSPHATASE: 103 U/L (ref 38–126)
ALT (SGPT): 17 U/L (ref <50)
ANION GAP: 9 mmol/L (ref 7–15)
AST (SGOT): 26 U/L (ref 19–55)
BILIRUBIN TOTAL: 0.6 mg/dL (ref 0.0–1.2)
BLOOD UREA NITROGEN: 25 mg/dL — ABNORMAL HIGH (ref 7–21)
BUN / CREAT RATIO: 14
CALCIUM: 9.3 mg/dL (ref 8.5–10.2)
CHLORIDE: 103 mmol/L (ref 98–107)
CREATININE: 1.84 mg/dL — ABNORMAL HIGH (ref 0.70–1.30)
EGFR CKD-EPI AA MALE: 39 mL/min/{1.73_m2} — ABNORMAL LOW (ref >=60)
GLUCOSE RANDOM: 109 mg/dL (ref 70–179)
POTASSIUM: 4.2 mmol/L (ref 3.5–5.0)
PROTEIN TOTAL: 6.2 g/dL — ABNORMAL LOW (ref 6.5–8.3)
SODIUM: 140 mmol/L (ref 135–145)

## 2018-11-18 LAB — CBC W/ AUTO DIFF
BASOPHILS ABSOLUTE COUNT: 0.1 10*9/L (ref 0.0–0.1)
BASOPHILS RELATIVE PERCENT: 0.7 %
EOSINOPHILS RELATIVE PERCENT: 3.3 %
HEMATOCRIT: 35.1 % — ABNORMAL LOW (ref 41.0–53.0)
HEMOGLOBIN: 11.4 g/dL — ABNORMAL LOW (ref 13.5–17.5)
LARGE UNSTAINED CELLS: 2 % (ref 0–4)
LYMPHOCYTES ABSOLUTE COUNT: 1.6 10*9/L (ref 1.5–5.0)
LYMPHOCYTES RELATIVE PERCENT: 23 %
MEAN CORPUSCULAR HEMOGLOBIN CONC: 32.4 g/dL (ref 31.0–37.0)
MEAN CORPUSCULAR HEMOGLOBIN: 30 pg (ref 26.0–34.0)
MEAN CORPUSCULAR VOLUME: 92.7 fL (ref 80.0–100.0)
MEAN PLATELET VOLUME: 7.9 fL (ref 7.0–10.0)
MONOCYTES ABSOLUTE COUNT: 0.4 10*9/L (ref 0.2–0.8)
MONOCYTES RELATIVE PERCENT: 5.8 %
NEUTROPHILS ABSOLUTE COUNT: 4.6 10*9/L (ref 2.0–7.5)
NEUTROPHILS RELATIVE PERCENT: 65 %
PLATELET COUNT: 201 10*9/L (ref 150–440)
RED BLOOD CELL COUNT: 3.79 10*12/L — ABNORMAL LOW (ref 4.50–5.90)
RED CELL DISTRIBUTION WIDTH: 15.2 % — ABNORMAL HIGH (ref 12.0–15.0)

## 2018-11-18 LAB — POTASSIUM: Potassium:SCnc:Pt:Ser/Plas:Qn:: 4.2

## 2018-11-18 LAB — FREE T4: Thyroxine.free:MCnc:Pt:Ser/Plas:Qn:: 0.7 — ABNORMAL LOW

## 2018-11-18 LAB — MONOCYTES RELATIVE PERCENT: Lab: 5.8

## 2018-11-18 LAB — PROSTATE SPECIFIC ANTIGEN: Prostate specific Ag:MCnc:Pt:Ser/Plas:Qn:: <0.1

## 2018-11-18 NOTE — Unmapped (Signed)
Labs drawn and sent.  Care provided by DChrisp, LPN.

## 2018-11-18 NOTE — Unmapped (Signed)
Clinical Pharmacist Practitioner: [facility]    Oral Hormonal therapy Follow-Up    Assessment and recommendations:  1. mCRPC Enzalutamide monitoring and side effect management- has minimal side effects associated with taking 40 mg daily. PSA undetectable.   - Continue enzalutamide 40 mg daily    2. Hypertension- BP elevated on current regimen 189/77 of carvedilol 50 mg BID, hydralazine 25 mg TID, and losartan 100 mg daily. Not consistently checking BP at home.   - Denies headaches, vision changes or dizziness  - Will see PCP on 11/24/18 for titration    3. Diarrhea- Felt due to green tea, GI workup negative no longer having diarrhea      4. DMT2 - had a low BP at last visit, lantus was decreased today BG normal  - Continue current regimen    Follow- up: 6 months or sooner if needed    ______________________________________________________________________    Oral Hormonal therapy: Enzalutamide 40 mg daily (dose decreased due to BP)     Start date: 12/17/17    HPI: Status post a radical prostatectomy in 2001 in [place]. He had salvage radiotherapy in 2002 and had ??been followed with a gradually rising PSA. He was found to have multiple pulmonary nodules, but no bone metastases. Lupron was started in early 2009 with dramatic reduction in his pulmonary disease.His PSA has been rising on ADT, so Casodex (2010, PSA 9.6) was started and subsequently Zytiga (04/2011, for sx C7 lesion without PSA rise). He received XRT C4-T2 2012 before starting Zytiga. He discontinued Zytiga because he had substantial weight gain on Zytiga and Prednisone. He also discontinued Xtandi (started in 05/2012) because of fatigue and rash, joint swelling. In looking back through his notes it looks like his Lupron was stoppped when he started casodex in 2010. Despite the side effects, he had a sustained biochemical response. He fractured a vertebrae falling off of a ladder and now has chronic back pain. Restarted primary ADT with Lupron 22.5 mg in 03/2016. PSA rise; Scans 11/2017 with increased size of 1 pulm nodule and LAN. Started Xtandi end of 11/2017 at a reduced dose of 80 mg in an effort to minimize SE, then reduced to 40 mg daily.    Interim History: Mr. Michael Stokes is feeling well. He has no symptoms. He denies headaches, dizziness or blurry vision.    Adherence: Take 1 capsule once daily in evening no doses missed    Toxicities: Hypertenison, diarrhea    Drug-Drug Interactions: Losartan concentrations will be reduced by CYP3A4 induction by enzalutamide.     Medications:  Current Outpatient Medications   Medication Sig Dispense Refill   ??? allopurinol (ZYLOPRIM) 100 MG tablet Take 0.5 tablets (50 mg total) by mouth daily. 180 tablet 3   ??? amitriptyline (ELAVIL) 25 MG tablet TAKE 1 TABLET(25 MG) BY MOUTH EVERY NIGHT 90 tablet 3   ??? aspirin (ENTERIC COATED ASPIRIN) 81 MG tablet Take 1 tablet (81 mg total) by mouth daily. 100 tablet 11   ??? atorvastatin (LIPITOR) 40 MG tablet Take 1 tablet (40 mg total) by mouth daily. 90 tablet 3   ??? blood sugar diagnostic (FREESTYLE LITE STRIPS) Strp by Other route Two (2) times a day. 100 strip 11   ??? carvedilol (COREG) 25 MG tablet Take 2 tablets (50 mg total) by mouth Two (2) times a day. 120 tablet 11   ??? cholecalciferol, vitamin D3, 1,000 unit tablet 2000u daily 30 tablet 11   ??? enzalutamide (XTANDI) 40 mg capsule Take 1  capsule (40 mg total) by mouth daily. 30 capsule 2   ??? hydrALAZINE (APRESOLINE) 25 MG tablet Take 1 tablet (25 mg total) by mouth Three (3) times a day. 120 tablet 11   ??? insulin ASPART (NOVOLOG FLEXPEN) 100 unit/mL (3 mL) injection pen 8u prior to breakfast and lunch, 9u prior to dinner 15 mL 3   ??? insulin glargine (LANTUS SOLOSTAR U-100 INSULIN) 100 unit/mL (3 mL) injection pen Inject 0.56 mL (56 Units total) under the skin daily. (Patient taking differently: Inject 38 Units under the skin daily. ) 15 mL 5   ??? levothyroxine (SYNTHROID) 25 MCG tablet Take 25 mcg by mouth daily.     ??? losartan (COZAAR) 100 MG tablet Take 1 tablet (100 mg total) by mouth daily. 90 tablet 3   ??? pen needle, diabetic (BD ULTRA-FINE NANO PEN NEEDLE) 32 gauge x 5/32 Ndle BID as directed w/ insulin 200 each 3   ??? venlafaxine (EFFEXOR XR) 75 MG 24 hr capsule Take 1 capsule (75 mg total) by mouth daily. 30 capsule 2   ??? vitamins  A,C,E-zinc-copper (PRESERVISION AREDS) 7,160-113-100 unit-mg-unit tablet 1 tab BID  0     No current facility-administered medications for this visit.      I spent 10 minutes with Mr.Copelan in direct patient care.     Laverna Peace PharmD, BCOP, CPP  Hematology/Oncology Pharmacist  P: [phone]

## 2018-11-18 NOTE — Unmapped (Signed)
CC: f/u prostate cancer    A&P:    1.  mCRPC:    ?? Lupron 22.5 mg due again on or after 02/16/2019  ?? Rec daily calcium and vitamin D  ?? Continue good self care  ?? Continue enzalutamide 40 mg daily (one tab/day)  ?? Excellent PSA response      2. HTN    ?? Manual BP high today, 188/77  ?? His PCP recently made some changes to his medications. He has a follow up with them next week. I've asked him to keep a BP log and bring it with him to that appt    3. Diarrhea    ?? Resolved  ?? F/u with GI PRN      4. CKD w/ SCr rise    ?? RUS ok  ?? Stable today; f/u with nephrology    5. Abnormal TSH    ?? Now being followed by our Int Med colleagues    RTC 6 weeks for labs, clinic visit      Approximately 30 minutes was spent in the care of this patient, half in education and counseling.  A copy of the AVS was provided to the patient which includes key points from our visit as well as our contact information.      Albertina Parr, NP-C  Adult Nurse Practitioner  Urology & Medical Oncology      Onc history:    --Status post a radical prostatectomy in 2001 in [place].   --He had salvage radiotherapy in 2002 and had  been followed with a gradually rising PSA. He was found to have multiple pulmonary nodules, but no bone metastases.   --Lupron was started in early 2009 with dramatic reduction in his pulmonary disease.    --His PSA has been rising on ADT, so Casodex (2010, PSA 9.6) was started and subsequently Zytiga (04/2011, for sx C7 lesion without PSA rise).  --XRT C4-T2 2012 before starting Zytiga  --He discontinued Zytiga because he had substantial weight gain on Zytiga and Prednisone.   --He also discontinued Xtandi (started in 05/2012) because of fatigue and rash, joint swelling.    In looking back through his notes it looks like his Lupron was stoppped when he started casodex in 2010    Despite the side effects, he had a sustained biochemical response. He fractured a vertebrae falling off of a ladder and now has chronic back pain. --Restarted primary ADT with Lupron 22.5 mg in 03/2016  --PSA rise; Scans 11/2017 with increased size of 1 pulm nodule and LAN  --Started Xtandi end of 11/2017 at a reduced dose of 80 mg in an effort to minimize SE  --taking 40 mg of Xtandi        HISTORY OF PRESENT ILLNESS:     Michael Stokes returns today for scheduled followup.    Today, he reports that he is doing well    Enjoyed the holidays with his daughter and grandchildren who live close by     has not been checking his BP at home recently  Remembers to take his medications every day    No n/v, headaches, SOB, cough, CP, LUTS, hematuria, LE edema    No LUTS, bone pain, weight loss    No SE from enza    Allergies   Allergen Reactions   ??? Amlodipine Swelling   ??? Topiramate Swelling   ??? Arb-Angiotensin Receptor Antagonist Other (See Comments)     Hyperkalemia   ??? Zolpidem Other (  See Comments)     Hangover     Meds: Updated in EPIC    10 pt ROS reviewed and negative except per HPI           VITAL SIGNS:          Vitals:    11/18/18 1117   BP: 189/77   Pulse: 78   Resp: 16   Temp: 36.7 ??C (98.1 ??F)   SpO2: 95%     GENERAL: Pleasant, NAD.   VS: Reviewed and unremarkable  Head: normocephalic  Eyes: sclera aniecteric  Pulm: Non labored WOB; CTA bil  CV: RRR; no MRG  Abd: Doft, NT, BS x 4, normoactive  Skin: Warm, dry  Msk: No Le edema  NEURO: A&O x 3      Data:    CT CAP: 5/17  IMPRESSION:   1. New retroperitoneal and pelvic adenopathy concerning for metastatic disease.  2. Pulmonary nodules, at least one of which is slightly increased in size, and a few of which are new compared to prior, concerning for metastatic disease.    Bone scan: 5/17  IMPRESSION:   -Grossly unchanged focal uptake in the right aspect of C7. No new osseous lesion identified.    CT CAP 11/2017    Impression     - Increased size of right lower lobe pulmonary nodule.    - Enlarged aortocaval lymph node now measuring 1.7 cm. Decreased size of previously seen retrocaval and external iliac lymph nodes.       Bone scan 11/2017    Impression     Increased focal uptake at C7 compared to most recent bone scan (03/20/2016) is concerning for recurrent disease, though is indeterminate given known history of metastatic lesion at this site.     Renal ultrasound 08/16/18    Impression       --Atrophic, echogenic right kidney, similar to prior CT.    --Mildly lobulated left renal contour. Otherwise, unremarkable sonographic appearance of the left kidney.         Lab Results   Component Value Date    PSA <0.10 11/18/2018    PSA <0.10 09/30/2018    PSA <0.10 08/19/2018    PSA <0.10 07/01/2018    PSA <0.10 05/20/2018    PSA <0.10 03/18/2018     03/27/2015 = 3.9     PSA Diagnostic   Date Value Ref Range Status   12/25/2014 3.9 0.0 - 4.0 ng/mL Final   10/20/2014 1.7 0.0 - 4.0 ng/mL Final   09/18/2014 2.1 0.0 - 4.0 ng/mL Final   06/12/2014 1.3 0.0 - 4.0 ng/mL Final   03/09/2014 0.5 0.0 - 4.0 ng/mL Final   12/08/2013 0.2 0.0 - 4.0 ng/mL Final   09/08/2013 <0.1 0.0 - 4.0 ng/mL Final   06/09/2013 <0.1 0.0 - 4.0 ng/mL Final   03/03/2013 <0.1 0.0 - 4.0 ng/mL Final   12/02/2012 <0.1 0.0 - 4.0 NG/ML Final   08/26/2012 <0.1 0.0 - 4.0 NG/ML Final   05/28/2012 <0.1 0.0 - 4.0 NG/ML Final   02/26/2012 <0.1 0.0 - 4.0 NG/ML Final   12/04/2011 <0.1 0.0 - 4.0 NG/ML Final   07/24/2011 1.4 0.0 - 4.0 NG/ML Final   04/17/2011 3.0 0.0 - 4.0 NG/ML Final   01/09/2011 3.1 0.0 - 4.0 NG/ML Final     2010-10-10 PSA, DIAGNOSTIC 2.3 NG/ML 0.0-4.0    2010-06-20 PSA, DIAGNOSTIC 1.2 NG/ML 0.0-4.0    2010-01-24 PSA, DIAGNOSTIC 1.1 NG/ML 0.0-4.0  His PSA increased previously to 9.6 . In 04/26/09 it was 3.7, 01/18/09 it was 1.2.

## 2018-11-18 NOTE — Unmapped (Addendum)
Lab Results   Component Value Date    PSA <0.10 11/18/2018    PSA <0.10 09/30/2018    PSA <0.10 08/19/2018    PSA <0.10 07/01/2018    PSA <0.10 05/20/2018    PSA <0.10 03/18/2018       ?? Follow up with primary care next week. Please check your blood pressure every day until then, and bring the log with you    ?? Continue the Xtandi (1 pill/day)    ?? Come back in 6 weeks for labs, visit    ?? Make sure to drink plenty of water    ?? Your cancer medicine is working great. Your PSA is <0.1      Albertina Parr, NP-C, OCN  Adult Nurse Practitioner  Urology and Medical Oncology    If you have been prescribed a medication today, always read the package insert that comes with the medication.    In the event of an emergency, always call 911    Urology:  Main clinic & [facility] [phone]  Fax: [phone]    Cancer Hospital:  Phone: [phone]  Fax: [phone]    After hours/nights/weekends:  Hospital Operator: [phone]    [email or web address]  [email or web address]

## 2018-11-24 ENCOUNTER — Ambulatory Visit
Admit: 2018-11-24 | Discharge: 2018-11-25 | Payer: PRIVATE HEALTH INSURANCE | Attending: Student in an Organized Health Care Education/Training Program | Primary: Student in an Organized Health Care Education/Training Program

## 2018-11-24 DIAGNOSIS — E039 Hypothyroidism, unspecified: Secondary | ICD-10-CM | POA: Diagnosis not present

## 2018-11-24 DIAGNOSIS — K529 Noninfective gastroenteritis and colitis, unspecified: Secondary | ICD-10-CM | POA: Diagnosis not present

## 2018-11-24 DIAGNOSIS — R600 Localized edema: Secondary | ICD-10-CM | POA: Diagnosis not present

## 2018-11-24 DIAGNOSIS — E11649 Type 2 diabetes mellitus with hypoglycemia without coma: Secondary | ICD-10-CM | POA: Diagnosis not present

## 2018-11-24 DIAGNOSIS — N183 Chronic kidney disease, stage 3 (moderate): Secondary | ICD-10-CM | POA: Diagnosis not present

## 2018-11-24 DIAGNOSIS — D638 Anemia in other chronic diseases classified elsewhere: Secondary | ICD-10-CM | POA: Diagnosis not present

## 2018-11-24 DIAGNOSIS — E1122 Type 2 diabetes mellitus with diabetic chronic kidney disease: Secondary | ICD-10-CM | POA: Diagnosis not present

## 2018-11-24 DIAGNOSIS — I10 Essential (primary) hypertension: Secondary | ICD-10-CM | POA: Diagnosis not present

## 2018-11-24 MED ORDER — HYDRALAZINE 100 MG TABLET
ORAL_TABLET | Freq: Three times a day (TID) | ORAL | 11 refills | 0 days | Status: CP
Start: 2018-11-24 — End: 2019-05-30

## 2018-11-24 MED ORDER — CARVEDILOL 25 MG TABLET
ORAL_TABLET | Freq: Two times a day (BID) | ORAL | 11 refills | 0 days | Status: CP
Start: 2018-11-24 — End: 2019-07-09

## 2018-11-24 MED ORDER — HYDRALAZINE 25 MG TABLET
ORAL_TABLET | Freq: Three times a day (TID) | ORAL | 11 refills | 0.00000 days | Status: CP
Start: 2018-11-24 — End: 2018-11-24

## 2018-11-24 MED ORDER — ALLOPURINOL 100 MG TABLET
ORAL_TABLET | Freq: Every day | ORAL | 3 refills | 0.00000 days | Status: CP
Start: 2018-11-24 — End: 2019-05-30

## 2018-11-24 NOTE — Unmapped (Signed)
Patient wanted to know about why he had anemia and explained that it was anemia of chronic disease.   -- rx only if Hb<10  -- Can order Iron studies next time he gets labs

## 2018-11-24 NOTE — Unmapped (Signed)
Seeing a nephrologist soon. Last BMP done on 11/18/18, Cr clearance remains 39.

## 2018-11-24 NOTE — Unmapped (Addendum)
--   take 50 mg hydralazine three times a day, if BP is still higher in the 140s (upper number) then go ahead and increase the hydralazine dose to 100 mg three times a day  -- Nutrition consult  -- Exercise at least 45 mins a day  -- next appointment on 2nd march

## 2018-11-24 NOTE — Unmapped (Addendum)
Pt reports new onset pedal edema that he noted a few days ago. Denies orthopnea or PND. Has not noted really any worsening dyspnea on exertion. Does have visible varicose veins  -- ECHO given his comorbidities.

## 2018-11-24 NOTE — Unmapped (Signed)
Remains poorly controlled despite being on Coreg 25 mg BID, cozaar 100 mg and hydralazine 25 mg TID.   --Increased his hydral to 50 mg TID w/ instructions to titrate up to 100 mg TID if he remained above 140 systolics  -- Diet - nutrition consult today  -- Encouraged to walk  -- Not sure if he truly had pedal edema w/ amlodipine but worth a try if he continues to have elevated BP despite being on hydral  -- Discussed issues w/ compliance with hydral since it is a TID dosing but patient felt comfortable w/ it and has been using a pill box.

## 2018-11-24 NOTE — Unmapped (Signed)
INTERNAL MEDICINE [facility] CLINIC NOTE  11/24/2018    PCP: [name], MD     Assessment and Plan    Hypertension  Remains poorly controlled despite being on Coreg 25 mg BID, cozaar 100 mg and hydralazine 25 mg TID.   --Increased his hydral to 50 mg TID w/ instructions to titrate up to 100 mg TID if he remained above 140 systolics  -- Diet - nutrition consult today  -- Encouraged to walk  -- Not sure if he truly had pedal edema w/ amlodipine but worth a try if he continues to have elevated BP despite being on hydral  -- Discussed issues w/ compliance with hydral since it is a TID dosing but patient felt comfortable w/ it and has been using a pill box.     CKD stage 3 due to type 2 diabetes mellitus (CMS-HCC)  Seeing a nephrologist soon. Last BMP done on 11/18/18, Cr clearance remains 39.     Anemia of chronic disease  Patient wanted to know about why he had anemia and explained that it was anemia of chronic disease.   -- rx only if Hb<10  -- Can order Iron studies next time he gets labs    Pedal edema  Pt reports new onset pedal edema that he noted a few days ago. Denies orthopnea or PND. Has not noted really any worsening dyspnea on exertion.   -- ECHO given his comorbidities.     Hypothyroidism  The plan was to recheck his TSH today but I unfortunately forgot to order the lab and the patient left, I have left a voicemail note asking him to get it done when he sees [name] on 12/14/18. He feels that his energy levels feel better after starting that medicine.   -- TSH recheck       Follow up as scheduled or sooner as needed.    Patient was seen and discussed with [name] who is in agreement with the assessment and plan as outlined above.       Subjective  Michael Stokes is a 84 y.o. year old male with the above problem list who presents to the clinic for   Chief Complaint   Patient presents with   ??? Follow-up     TSH recheck     Patient feels well. His BP remains elevated despite starting him on hydralazine. He has gained 2 lbs. Most of his meals are home cooked and his wife does the cooking but they have been also consuming canned soups. He does not exercise everyday, he has some machines at home but occasionally uses them.  His energy levels feel better after starting levothyroxine.   He continues to have chronic diarrhea for which he saw GI in November 2019. He was prescribed psyllium and that helped make his stools more formed and decreased frequency of BMs from 4-5 to 2-3 BMs per day.   He also mentions that he noted that his legs have been swollen which is new for him. He denies exertional dyspnea, orthopnea or PND. His DM has been well controlled.   Last time we met he was having low BGs, but that seems to have resolved now.       Meds and allergies were reviewed in Epic    ROS: 10 point ROS was performed and is otherwise negative other than mentioned in the HPI    Objective  PE:  Vitals:    11/24/18 1043   BP: 178/76  Pulse: 77   Temp: 36.8 ??C   SpO2: 97%     General: well-appearing in NAD  Eyes: EOMI, sclera clear, PERRL  ENT: OP clear w/o erythema or exudate  CV: regular, no murmurs  Resp: CTAB, no wheezes or crackles, normal WOB  GI: soft, NTND, NABS  MSK: full ROM, no deformity noted  Skin: clean and dry, no rashes or lesions noted  Ext: 1+ pitting edema b/l and b/l varicose veins visible.   Neuro: alert, follows commands. CN II-XII grossly intact

## 2018-11-24 NOTE — Unmapped (Addendum)
The plan was to recheck his TSH today but I unfortunately forgot to order the lab and the patient left, I have left a voicemail note asking him to get it done when he sees [name] on 12/14/18. He feels that his energy levels feel better after starting that medicine.   -- TSH recheck

## 2018-11-26 NOTE — Unmapped (Signed)
Immediately after or during the visit, I reviewed with the resident the medical history and the resident’s findings on physical examination.  I discussed with the resident the patient’s diagnosis and concur with the treatment plan as documented in the resident note. Lakyra Tippins C Aqil Goetting, MD

## 2018-11-27 MED ORDER — AMITRIPTYLINE 25 MG TABLET
ORAL_TABLET | 3 refills | 0 days | Status: CP
Start: 2018-11-27 — End: 2019-02-24

## 2018-12-02 ENCOUNTER — Ambulatory Visit: Admit: 2018-12-02 | Discharge: 2018-12-03 | Payer: PRIVATE HEALTH INSURANCE

## 2018-12-02 DIAGNOSIS — R600 Localized edema: Secondary | ICD-10-CM | POA: Diagnosis not present

## 2018-12-02 DIAGNOSIS — I342 Nonrheumatic mitral (valve) stenosis: Secondary | ICD-10-CM | POA: Diagnosis not present

## 2018-12-02 DIAGNOSIS — I358 Other nonrheumatic aortic valve disorders: Secondary | ICD-10-CM | POA: Diagnosis not present

## 2018-12-06 MED ORDER — VENLAFAXINE ER 75 MG CAPSULE,EXTENDED RELEASE 24 HR
ORAL_CAPSULE | Freq: Every day | ORAL | 2 refills | 0.00000 days
Start: 2018-12-06 — End: 2019-06-17

## 2018-12-11 NOTE — Unmapped (Signed)
[facility] Refill Coordination Note    Specialty Medication(s) to be Shipped:   Hematology/Oncology: Diana Eves    Other medication(s) to be shipped: none     [name], DOB: 1935/04/22  Phone: [phone] (home)       All above HIPAA information was verified with patient.     Completed refill call assessment today to schedule patient's medication shipment from the [facility] [phone]).       Specialty medication(s) and dose(s) confirmed: Regimen is correct and unchanged.   Changes to medications: Zollie Beckers reports no changes reported at this time.  Changes to insurance: No  Questions for the pharmacist: No    The patient will receive a drug information handout for each medication shipped and additional FDA Medication Guides as required.      DISEASE/MEDICATION-SPECIFIC INFORMATION        N/A    ADHERENCE     Medication Adherence    Patient reported X missed doses in the last month:  0  Adherence tools used:  medication list   Other adherence tool:  routine               MEDICARE PART B DOCUMENTATION     Xtandi 40mg : Patient has 8 days worth of on hand.    SHIPPING     Shipping address confirmed in Epic.     Delivery Scheduled: Yes, Expected medication delivery date: 12/15/18 via UPS or courier.     Medication will be delivered via UPS to the home address in Epic WAM.    [place] A Sarkis Rhines   [facility] Specialty Technician

## 2018-12-14 DIAGNOSIS — R404 Transient alteration of awareness: Secondary | ICD-10-CM | POA: Diagnosis not present

## 2018-12-14 DIAGNOSIS — E162 Hypoglycemia, unspecified: Secondary | ICD-10-CM | POA: Diagnosis not present

## 2018-12-14 DIAGNOSIS — E161 Other hypoglycemia: Secondary | ICD-10-CM | POA: Diagnosis not present

## 2018-12-14 DIAGNOSIS — R0902 Hypoxemia: Secondary | ICD-10-CM | POA: Diagnosis not present

## 2018-12-14 DIAGNOSIS — R52 Pain, unspecified: Secondary | ICD-10-CM | POA: Diagnosis not present

## 2018-12-14 NOTE — Unmapped (Signed)
Michael Stokes 's Xtandi shipment will be delayed due to Cost Increase We have contacted the patient and communicated the delivery change to patient/caregiver We will call the patient to reschedule the delivery upon resolution. We have confirmed the delivery date as n/a .

## 2018-12-16 MED ORDER — FREESTYLE LITE STRIPS
ORAL_STRIP | Freq: Two times a day (BID) | 11 refills | 0 days | Status: CP
Start: 2018-12-16 — End: 2019-02-24

## 2018-12-16 MED ORDER — PEN NEEDLE, DIABETIC 32 GAUGE X 5/32" (4 MM)
3 refills | 0 days | Status: CP
Start: 2018-12-16 — End: ?

## 2018-12-16 MED ORDER — AMITRIPTYLINE 25 MG TABLET
ORAL_TABLET | 3 refills | 0 days | Status: CP
Start: 2018-12-16 — End: 2018-12-17

## 2018-12-17 ENCOUNTER — Ambulatory Visit: Admit: 2018-12-17 | Discharge: 2018-12-18 | Payer: PRIVATE HEALTH INSURANCE

## 2018-12-17 DIAGNOSIS — Z794 Long term (current) use of insulin: Secondary | ICD-10-CM | POA: Diagnosis not present

## 2018-12-17 DIAGNOSIS — E118 Type 2 diabetes mellitus with unspecified complications: Secondary | ICD-10-CM | POA: Diagnosis not present

## 2018-12-17 DIAGNOSIS — E1122 Type 2 diabetes mellitus with diabetic chronic kidney disease: Secondary | ICD-10-CM | POA: Diagnosis not present

## 2018-12-17 DIAGNOSIS — N183 Chronic kidney disease, stage 3 (moderate): Secondary | ICD-10-CM | POA: Diagnosis not present

## 2018-12-17 DIAGNOSIS — I10 Essential (primary) hypertension: Secondary | ICD-10-CM | POA: Diagnosis not present

## 2018-12-17 MED ORDER — LANTUS SOLOSTAR U-100 INSULIN 100 UNIT/ML (3 ML) SUBCUTANEOUS PEN
Freq: Every day | SUBCUTANEOUS | 0 days
Start: 2018-12-17 — End: 2019-01-22

## 2018-12-17 NOTE — Unmapped (Addendum)
Assessment/Plan:     Lab Results   Component Value Date    A1C 6.1 12/17/2018    A1C 5.8 09/13/2018    A1C 5.7 07/07/2018       1. DM2 - controlled, continue Lantus 38u nightly, Novolog 8/8/9u.  Continue ASA, statin, ARB.  Phone call approx 2-3 wks to assess for additional lows or highs given current changes.  Recheck a1c 3 mo instead of 6 given changes.    2. htn - manual recheck 124/56, continue hydralazine 100mg  TID, Coreg 50mg  BID, losartan 100mg  daily.  If additional agent needed in future, PCP mentioned possible retrial of amlodipine  3. CKDIII - has nephrology f/u in 5/20    F/u: 3 mo a1c    Subject/Objective:     Chief Complaint   Patient presents with   ??? Diabetes     3 month     S: 84 y.o. year old male with PMHx significant for   Past Medical History:   Diagnosis Date   ??? Anemia of chronic disease 11/24/2018   ??? Cancer with pulmonary metastases (CMS-HCC)    ??? Chronic kidney disease    ??? Depression    ??? Diabetes Mellitus, uncontrolled 06/06/2009   ??? GERD (gastroesophageal reflux disease)    ??? Hypertension    ??? Hypertension 06/06/2009   ??? Peripheral neuropathy    .     Patient Active Problem List   Diagnosis   ??? Diabetes   ??? Hypertension   ??? Malignant neoplasm of prostate (CMS-HCC)   ??? Polyneuropathy in diabetes (CMS-HCC)   ??? Hypercholesterolemia   ??? Cancer with pulmonary metastases (CMS-HCC)   ??? Dupuytren's contracture of both hands   ??? Rotator cuff tear   ??? Kyphosis, acquired   ??? Durable power of attorney for healthcare exists but copy not available   ??? CKD stage 3 due to type 2 diabetes mellitus (CMS-HCC)   ??? Gout   ??? Enrolled in chronic care management   ??? Anemia of chronic disease   ??? Pedal edema   ??? Hypothyroidism   ??? Chronic diarrhea       Presents for DM and htn f/u.  10/28/18, Lantus reduced to 38u nightly, tolerating w/out lows.  Novolog remains 8/8/9.  At 11/24/18 PCP f/u, hydralazine increased to 50mg  TID then 100mg  TID shortly thereafter, tolerating       Your Medication List          Accurate as of December 17, 2018 10:16 AM. If you have any questions, ask your nurse or doctor.            CHANGE how you take these medications    amitriptyline 25 MG tablet  Commonly known as:  ELAVIL  TAKE 1 TABLET(25 MG) BY MOUTH EVERY NIGHT  What changed:  Another medication with the same name was removed. Continue taking this medication, and follow the directions you see here.  Changed by:  Dwana Melena, PA        CONTINUE taking these medications    allopurinoL 100 MG tablet  Commonly known as:  ZYLOPRIM  Take 0.5 tablets (50 mg total) by mouth daily.     aspirin 81 MG tablet  Commonly known as:  ENTERIC COATED ASPIRIN  Take 1 tablet (81 mg total) by mouth daily.     atorvastatin 40 MG tablet  Commonly known as:  LIPITOR  Take 1 tablet (40 mg total) by mouth daily.     carvediloL 25 MG  tablet  Commonly known as:  COREG  Take 2 tablets (50 mg total) by mouth Two (2) times a day.     cholecalciferol (vitamin D3) 1,000 unit (25 mcg) tablet  2000u daily     FREESTYLE LITE STRIPS Strp  Generic drug:  blood sugar diagnostic  by Other route Two (2) times a day.     hydrALAZINE 100 MG tablet  Commonly known as:  APRESOLINE  Take 1 tablet (100 mg total) by mouth Three (3) times a day.     insulin ASPART 100 unit/mL (3 mL) injection pen  Commonly known as:  NovoLOG FLEXPEN  8u prior to breakfast and lunch, 9u prior to dinner     LANTUS SOLOSTAR U-100 INSULIN 100 unit/mL (3 mL) injection pen  Generic drug:  insulin glargine  Inject 0.38 mL (38 Units total) under the skin daily.     levothyroxine 25 MCG tablet  Commonly known as:  SYNTHROID  Take 25 mcg by mouth daily.     losartan 100 MG tablet  Commonly known as:  COZAAR  Take 1 tablet (100 mg total) by mouth daily.     pen needle, diabetic 32 gauge x 5/32 Ndle  Commonly known as:  BD ULTRA-FINE NANO PEN NEEDLE  BID as directed w/ insulin     venlafaxine 75 MG 24 hr capsule  Commonly known as:  EFFEXOR XR  Take 1 capsule (75 mg total) by mouth daily.     vitamins A,C,E-zinc-copper 7,160-113-100 unit-mg-unit tablet  Commonly known as:  PRESERVISION AREDS  1 tab BID     XTANDI 40 mg capsule  Generic drug:  enzalutamide  Take 1 capsule (40 mg total) by mouth daily.            Allergies   Allergen Reactions   ??? Amlodipine Swelling   ??? Topiramate Swelling   ??? Zolpidem Other (See Comments)     Hangover        I have reviewed and addressed the patient???s adherence and response to prescribed medications. I have identified patient barriers to following the proposed medication and treatment plan, and have noted opportunities to optimize healthy behaviors. I have answered the patient???s questions to satisfaction and the patient voices understanding.          ROS: negative for vision changes, CP, abdominal pain, bowel/bladder changes, bleeding, lower extremity edema    O:  Vital Signs:    Vitals:    12/17/18 0954 12/17/18 1014   BP: 176/70 124/56   Pulse: 77    Temp: 36.6 ??C (97.8 ??F)    SpO2: 98%    Weight: 94.6 kg (208 lb 9.6 oz)    Height: 171.5 cm (5' 7.5)        Physical Exam  General Appearance:    Alert, cooperative, no distress, appears stated age   Head:    Normocephalic, without obvious abnormality, atraumatic   Eyes:     Ears:     Throat:   Lips, mucosa, and tongue normal   Neck:   Supple, symmetrical, trachea midline,      No JVD bilaterally.   Lungs:     Clear to auscultation bilaterally, respirations unlabored   Chest Wall:    No tenderness or deformity    Heart:    Regular rate and rhythm, S1 and S2 normal, no murmur, rub    or gallop   Abdomen:     Soft, non-tender       Extremities:  Extremities normal, atraumatic, no cyanosis or edema   Psych :  No agitation, anxiety, or depressed mood.

## 2018-12-30 ENCOUNTER — Ambulatory Visit
Admit: 2018-12-30 | Discharge: 2018-12-31 | Payer: PRIVATE HEALTH INSURANCE | Attending: Nurse Practitioner | Primary: Nurse Practitioner

## 2018-12-30 ENCOUNTER — Other Ambulatory Visit: Admit: 2018-12-30 | Discharge: 2018-12-31 | Payer: PRIVATE HEALTH INSURANCE

## 2018-12-30 DIAGNOSIS — C61 Malignant neoplasm of prostate: Secondary | ICD-10-CM | POA: Diagnosis not present

## 2018-12-30 LAB — COMPREHENSIVE METABOLIC PANEL
ALBUMIN: 4 g/dL (ref 3.5–5.0)
ALKALINE PHOSPHATASE: 99 U/L (ref 38–126)
ALT (SGPT): 18 U/L (ref <50)
ANION GAP: 13 mmol/L (ref 7–15)
AST (SGOT): 27 U/L (ref 19–55)
BILIRUBIN TOTAL: 0.8 mg/dL (ref 0.0–1.2)
BLOOD UREA NITROGEN: 37 mg/dL — ABNORMAL HIGH (ref 7–21)
BUN / CREAT RATIO: 18
CALCIUM: 9.4 mg/dL (ref 8.5–10.2)
CHLORIDE: 101 mmol/L (ref 98–107)
CREATININE: 2.1 mg/dL — ABNORMAL HIGH (ref 0.70–1.30)
EGFR CKD-EPI AA MALE: 33 mL/min/{1.73_m2} — ABNORMAL LOW (ref >=60)
EGFR CKD-EPI NON-AA MALE: 29 mL/min/{1.73_m2} — ABNORMAL LOW (ref >=60)
GLUCOSE RANDOM: 188 mg/dL — ABNORMAL HIGH (ref 70–179)
POTASSIUM: 4.8 mmol/L (ref 3.5–5.0)
SODIUM: 142 mmol/L (ref 135–145)

## 2018-12-30 LAB — CBC W/ AUTO DIFF
BASOPHILS ABSOLUTE COUNT: 0.1 10*9/L (ref 0.0–0.1)
BASOPHILS RELATIVE PERCENT: 0.8 %
EOSINOPHILS ABSOLUTE COUNT: 0.2 10*9/L (ref 0.0–0.4)
EOSINOPHILS RELATIVE PERCENT: 4 %
HEMATOCRIT: 33.6 % — ABNORMAL LOW (ref 41.0–53.0)
LARGE UNSTAINED CELLS: 2 % (ref 0–4)
LYMPHOCYTES ABSOLUTE COUNT: 1.3 10*9/L — ABNORMAL LOW (ref 1.5–5.0)
LYMPHOCYTES RELATIVE PERCENT: 21 %
MEAN CORPUSCULAR HEMOGLOBIN CONC: 33.2 g/dL (ref 31.0–37.0)
MEAN CORPUSCULAR HEMOGLOBIN: 30.2 pg (ref 26.0–34.0)
MEAN CORPUSCULAR VOLUME: 90.9 fL (ref 80.0–100.0)
MEAN PLATELET VOLUME: 7.3 fL (ref 7.0–10.0)
MONOCYTES ABSOLUTE COUNT: 0.4 10*9/L (ref 0.2–0.8)
MONOCYTES RELATIVE PERCENT: 7 %
NEUTROPHILS ABSOLUTE COUNT: 4 10*9/L (ref 2.0–7.5)
NEUTROPHILS RELATIVE PERCENT: 65.4 %
PLATELET COUNT: 193 10*9/L (ref 150–440)
RED BLOOD CELL COUNT: 3.69 10*12/L — ABNORMAL LOW (ref 4.50–5.90)
RED CELL DISTRIBUTION WIDTH: 15.2 % — ABNORMAL HIGH (ref 12.0–15.0)
WBC ADJUSTED: 6.2 10*9/L (ref 4.5–11.0)

## 2018-12-30 LAB — CREATININE: Creatinine:MCnc:Pt:Ser/Plas:Qn:: 2.1 — ABNORMAL HIGH

## 2018-12-30 LAB — RED BLOOD CELL COUNT: Lab: 3.69 — ABNORMAL LOW

## 2018-12-30 LAB — PROSTATE SPECIFIC ANTIGEN: Prostate specific Ag:MCnc:Pt:Ser/Plas:Qn:: <0.1

## 2018-12-30 NOTE — Unmapped (Addendum)
Lab Results   Component Value Date    PSA <0.10 12/30/2018    PSA <0.10 11/18/2018    PSA <0.10 09/30/2018    PSA <0.10 08/19/2018    PSA <0.10 07/01/2018    PSA <0.10 05/20/2018       ?? I sent a message to Dr Lorin Picket to let him know about your blood sugar    ?? Continue the Xtandi (1 pill/day)    ?? Come back in ~6 weeks for labs, visit, Lupron    ?? Make sure to drink plenty of water    ?? Your cancer medicine is working great. Your PSA is <0.1      Albertina Parr, NP-C, OCN  Adult Nurse Practitioner  Urology and Medical Oncology    If you have been prescribed a medication today, always read the package insert that comes with the medication.    In the event of an emergency, always call 911    Urology:  Main clinic & [facility]: [phone]  Fax: [phone]    Cancer Hospital:  Phone: [phone]  Fax: [phone]    After hours/nights/weekends:  Hospital Operator: [phone]    [email or web address]  [email or web address]

## 2018-12-30 NOTE — Unmapped (Signed)
CC: f/u prostate cancer    A&P:    1.  mCRPC:    ?? Lupron 22.5 mg due again on or after 02/16/2019  ?? Rec daily calcium and vitamin D  ?? Continue good self care  ?? Continue enzalutamide 40 mg daily (one tab/day)  ?? Excellent PSA response      2. HTN    ?? Will continue to f/u with his internal med team    3. Diarrhea    ?? Resolved  ?? F/u with GI PRN      4. CKD w/ SCr rise    ?? RUS ok  ?? Stable today; f/u with nephrology    5. Abnormal TSH    ?? Now being followed by our Int Med colleagues    6. DM with recent episodes of hypoglycemia    ?? I sent a message to Mr Michael Stokes and have asked Mr Michael Stokes to monitor his glucose closely    RTC 6 weeks for labs, clinic visit, Lupron      Approximately 30 minutes was spent in the care of this patient, half in education and counseling.    A copy of the AVS was provided to the patient which includes key points from our visit as well as our contact information.      Albertina Parr, NP-C  Adult Nurse Practitioner  Urology & Medical Oncology      Onc history:    --Status post a radical prostatectomy in 2001 in [place].   --He had salvage radiotherapy in 2002 and had  been followed with a gradually rising PSA. He was found to have multiple pulmonary nodules, but no bone metastases.   --Lupron was started in early 2009 with dramatic reduction in his pulmonary disease.    --His PSA has been rising on ADT, so Casodex (2010, PSA 9.6) was started and subsequently Zytiga (04/2011, for sx C7 lesion without PSA rise).  --XRT C4-T2 2012 before starting Zytiga  --He discontinued Zytiga because he had substantial weight gain on Zytiga and Prednisone.   --He also discontinued Xtandi (started in 05/2012) because of fatigue and rash, joint swelling.    In looking back through his notes it looks like his Lupron was stoppped when he started casodex in 2010    Despite the side effects, he had a sustained biochemical response. He fractured a vertebrae falling off of a ladder and now has chronic back pain. --Restarted primary ADT with Lupron 22.5 mg in 03/2016  --PSA rise; Scans 11/2017 with increased size of 1 pulm nodule and LAN  --Started Xtandi end of 11/2017 at a reduced dose of 80 mg in an effort to minimize SE  --taking 40 mg of Xtandi        HISTORY OF PRESENT ILLNESS:     Michael Stokes returns today for scheduled followup.    Today, he reports that he is doing well with the exception of 2 episodes of severe hypoglycemia. The first one happened at home, where he passed out and EMS was called. Glucose was ~35. Responded to IV dextrose and was not taken to the hospital. The second episode happened while he was with his wife at a medical appt and he started to feel poorly. The staff there checked his glucose and it was 35. Responded to juice and a candy bar. He did not share this with PA Michael Stokes, who manages his DM. Says he sugars have been 90s-120s since then     His wife  had foot surgery then broke her arm trying to use the scooter instead of crutches. He has been doing more around the house    Remembers to take his medications every day    No n/v, headaches, SOB, cough, CP, LUTS, hematuria, LE edema    No SE from enza    Allergies   Allergen Reactions   ??? Amlodipine Swelling   ??? Topiramate Swelling   ??? Zolpidem Other (See Comments)     Hangover     Meds: Updated in EPIC    10 pt ROS reviewed and negative except per HPI           VITAL SIGNS:          Vitals:    12/30/18 1111   BP: 166/74   Pulse: 72   Resp: 18   Temp: 36.3 ??C (97.3 ??F)   SpO2: 97%     GENERAL: Pleasant, NAD.   VS: Reviewed and unremarkable  Head: normocephalic  Eyes: sclera aniecteric  Pulm: Non labored WOB; CTA bil  CV: RRR; no MRG  Abd: Doft, NT, BS x 4, normoactive  Skin: Warm, dry  Msk: No Le edema  NEURO: A&O x 3      Data:    CT CAP: 5/17  IMPRESSION:   1. New retroperitoneal and pelvic adenopathy concerning for metastatic disease.  2. Pulmonary nodules, at least one of which is slightly increased in size, and a few of which are new compared to prior, concerning for metastatic disease.    Bone scan: 5/17  IMPRESSION:   -Grossly unchanged focal uptake in the right aspect of C7. No new osseous lesion identified.    CT CAP 11/2017    Impression     - Increased size of right lower lobe pulmonary nodule.    - Enlarged aortocaval lymph node now measuring 1.7 cm. Decreased size of previously seen retrocaval and external iliac lymph nodes.       Bone scan 11/2017    Impression     Increased focal uptake at C7 compared to most recent bone scan (03/20/2016) is concerning for recurrent disease, though is indeterminate given known history of metastatic lesion at this site.     Renal ultrasound 08/16/18    Impression       --Atrophic, echogenic right kidney, similar to prior CT.    --Mildly lobulated left renal contour. Otherwise, unremarkable sonographic appearance of the left kidney.         Lab Results   Component Value Date    PSA <0.10 12/30/2018    PSA <0.10 11/18/2018    PSA <0.10 09/30/2018    PSA <0.10 08/19/2018    PSA <0.10 07/01/2018    PSA <0.10 05/20/2018     03/27/2015 = 3.9     PSA Diagnostic   Date Value Ref Range Status   12/25/2014 3.9 0.0 - 4.0 ng/mL Final   10/20/2014 1.7 0.0 - 4.0 ng/mL Final   09/18/2014 2.1 0.0 - 4.0 ng/mL Final   06/12/2014 1.3 0.0 - 4.0 ng/mL Final   03/09/2014 0.5 0.0 - 4.0 ng/mL Final   12/08/2013 0.2 0.0 - 4.0 ng/mL Final   09/08/2013 <0.1 0.0 - 4.0 ng/mL Final   06/09/2013 <0.1 0.0 - 4.0 ng/mL Final   03/03/2013 <0.1 0.0 - 4.0 ng/mL Final   12/02/2012 <0.1 0.0 - 4.0 NG/ML Final   08/26/2012 <0.1 0.0 - 4.0 NG/ML Final   05/28/2012 <0.1 0.0 - 4.0 NG/ML Final  02/26/2012 <0.1 0.0 - 4.0 NG/ML Final   12/04/2011 <0.1 0.0 - 4.0 NG/ML Final   07/24/2011 1.4 0.0 - 4.0 NG/ML Final   04/17/2011 3.0 0.0 - 4.0 NG/ML Final   01/09/2011 3.1 0.0 - 4.0 NG/ML Final     2010-10-10 PSA, DIAGNOSTIC 2.3 NG/ML 0.0-4.0    2010-06-20 PSA, DIAGNOSTIC 1.2 NG/ML 0.0-4.0    2010-01-24 PSA, DIAGNOSTIC 1.1 NG/ML 0.0-4.0           His PSA increased previously to 9.6 . In 04/26/09 it was 3.7, 01/18/09 it was 1.2.

## 2018-12-30 NOTE — Unmapped (Signed)
Labs drawn and sent for analysis.  Care provided by  Elyn Peers, RN

## 2018-12-31 NOTE — Unmapped (Addendum)
CA called patient to relay the information below. Patient reports that episode of hypoglycemia yesterday was due to getting off schedule and not eating at usual time. He reports that everything is fine now and BGs are back to normal, 111-128.    Informed patient to decrease Lantus from 38 to 34 units and decrease Novolog from 8/8/9 to 5/5/6 units. Patient expressed understanding and agreed to be called back next week for check in.       ----- Message from Julieanne Cotton, [place] sent at 12/31/2018 10:00 AM EST -----  Regarding: RE: fyi: hypoglycemia  Corrie Dandy,  Thanks for the heads up, hope all is well.    Marylu Lund,  Can you call [name] w/ plan to reduce Lantus from 38 to 34u nightly and reduce mealtime from 8/8/9 to 5/5/6u respectively?  Can you also make a note to call back w/in 1 week to assess changes?  Thanks    R. Arby Barrette  ----- Message -----  From: Chancy Hurter, ANP  Sent: 12/30/2018  11:38 AM EST  To: Julieanne Cotton, PA  Subject: fyi: hypoglycemia                                Hey there-    Wanted to let you know that Mr Michael Stokes had 2 episodes of severe hypoglycemia about three weeks ago. The first happened at home and he passed out. EMS was called and his glucose was 35. Responded to what sounds like IV dextrose at home and he was not taken to the hospital. The following today, while he was with his wife at an appointment, he started feeling poorly. Glucose was again around 35. The team there gave him a candy car and juice.     Says he hasn't had any further episodes and has not made any adjustments to  his insulin.     Wanted to  make sure you were aware in the event you wanted to see him and/or make changes to his current regimen.     Thanks, Corrie Dandy

## 2019-01-06 NOTE — Unmapped (Signed)
Diabetes phone call note    Michael Stokes is a 84 y.o. male  Lab Results   Component Value Date    A1C 6.1 12/17/2018     PCP: [name], MD  Future visit: 01/10/2019 with PCP   Retinal Eye Exam: up to date in Epic  Foot Exam: Up to date in EPIC. Reminded patient that they need to wear foot wear at all times.     The Diabetes Enhanced Care Team spoke to Michael Stokes on January 06, 2019 to follow up on patient's diabetic regimen and recent blood sugar readings.    During last appointment/telephone call, patient was instructed to:  - Decrease Lantus from 38 to 34 units  - Decrease Novolog from 8/8/9 to 5/5/6 units    Patient reports diabetes regimen of:  - Lantus 34 units nightly  - Novolog 5/5/6 with meals     Missed doses? no   Skipping doses to make them last longer? no    Pt reports blood sugars of:  Date Before breakfast After breakfast   2/27 - 143   2/26 107 -   2/25 108 -   2/24 101 -   2/23 76 -   2/22 122 -     Hypos? no

## 2019-01-10 ENCOUNTER — Ambulatory Visit
Admit: 2019-01-10 | Discharge: 2019-01-10 | Payer: PRIVATE HEALTH INSURANCE | Attending: Student in an Organized Health Care Education/Training Program | Primary: Student in an Organized Health Care Education/Training Program

## 2019-01-10 ENCOUNTER — Ambulatory Visit: Admit: 2019-01-10 | Discharge: 2019-01-10 | Payer: PRIVATE HEALTH INSURANCE

## 2019-01-10 DIAGNOSIS — E039 Hypothyroidism, unspecified: Secondary | ICD-10-CM | POA: Diagnosis not present

## 2019-01-10 DIAGNOSIS — D638 Anemia in other chronic diseases classified elsewhere: Secondary | ICD-10-CM | POA: Diagnosis not present

## 2019-01-10 DIAGNOSIS — N183 Chronic kidney disease, stage 3 (moderate): Secondary | ICD-10-CM | POA: Diagnosis not present

## 2019-01-10 DIAGNOSIS — E1122 Type 2 diabetes mellitus with diabetic chronic kidney disease: Secondary | ICD-10-CM | POA: Diagnosis not present

## 2019-01-10 DIAGNOSIS — I10 Essential (primary) hypertension: Secondary | ICD-10-CM | POA: Diagnosis not present

## 2019-01-10 DIAGNOSIS — E11649 Type 2 diabetes mellitus with hypoglycemia without coma: Secondary | ICD-10-CM | POA: Diagnosis not present

## 2019-01-10 DIAGNOSIS — C61 Malignant neoplasm of prostate: Secondary | ICD-10-CM | POA: Diagnosis not present

## 2019-01-10 DIAGNOSIS — C7800 Secondary malignant neoplasm of unspecified lung: Principal | ICD-10-CM

## 2019-01-10 DIAGNOSIS — E1165 Type 2 diabetes mellitus with hyperglycemia: Principal | ICD-10-CM

## 2019-01-10 DIAGNOSIS — N189 Chronic kidney disease, unspecified: Principal | ICD-10-CM

## 2019-01-10 DIAGNOSIS — F329 Major depressive disorder, single episode, unspecified: Principal | ICD-10-CM

## 2019-01-10 DIAGNOSIS — G629 Polyneuropathy, unspecified: Principal | ICD-10-CM

## 2019-01-10 DIAGNOSIS — K219 Gastro-esophageal reflux disease without esophagitis: Principal | ICD-10-CM

## 2019-01-10 LAB — CBC W/ AUTO DIFF
BASOPHILS ABSOLUTE COUNT: 0.1 10*9/L (ref 0.0–0.1)
BASOPHILS RELATIVE PERCENT: 0.9 %
EOSINOPHILS ABSOLUTE COUNT: 0.3 10*9/L (ref 0.0–0.4)
EOSINOPHILS RELATIVE PERCENT: 4.4 %
HEMATOCRIT: 31.1 % — ABNORMAL LOW (ref 41.0–53.0)
HEMOGLOBIN: 10.1 g/dL — ABNORMAL LOW (ref 13.5–17.5)
LARGE UNSTAINED CELLS: 4 % (ref 0–4)
LYMPHOCYTES ABSOLUTE COUNT: 1.4 10*9/L — ABNORMAL LOW (ref 1.5–5.0)
LYMPHOCYTES RELATIVE PERCENT: 20.3 %
MEAN CORPUSCULAR HEMOGLOBIN CONC: 32.6 g/dL (ref 31.0–37.0)
MEAN CORPUSCULAR HEMOGLOBIN: 29.8 pg (ref 26.0–34.0)
MEAN CORPUSCULAR VOLUME: 91.5 fL (ref 80.0–100.0)
MEAN PLATELET VOLUME: 8.3 fL (ref 7.0–10.0)
MONOCYTES ABSOLUTE COUNT: 0.4 10*9/L (ref 0.2–0.8)
MONOCYTES RELATIVE PERCENT: 6.3 %
NEUTROPHILS ABSOLUTE COUNT: 4.3 10*9/L (ref 2.0–7.5)
NEUTROPHILS RELATIVE PERCENT: 64.2 %
PLATELET COUNT: 232 10*9/L (ref 150–440)
RED BLOOD CELL COUNT: 3.4 10*12/L — ABNORMAL LOW (ref 4.50–5.90)
WBC ADJUSTED: 6.6 10*9/L (ref 4.5–11.0)

## 2019-01-10 LAB — COMPREHENSIVE METABOLIC PANEL
ALBUMIN: 3.8 g/dL (ref 3.5–5.0)
ALKALINE PHOSPHATASE: 90 U/L (ref 38–126)
ALT (SGPT): 18 U/L (ref <50)
ANION GAP: 9 mmol/L (ref 7–15)
AST (SGOT): 30 U/L (ref 19–55)
BILIRUBIN TOTAL: 0.4 mg/dL (ref 0.0–1.2)
BLOOD UREA NITROGEN: 35 mg/dL — ABNORMAL HIGH (ref 7–21)
BUN / CREAT RATIO: 15
CO2: 27 mmol/L (ref 22.0–30.0)
EGFR CKD-EPI AA MALE: 29 mL/min/{1.73_m2} — ABNORMAL LOW (ref >=60)
EGFR CKD-EPI NON-AA MALE: 25 mL/min/{1.73_m2} — ABNORMAL LOW (ref >=60)
GLUCOSE RANDOM: 52 mg/dL — ABNORMAL LOW (ref 70–179)
POTASSIUM: 4.6 mmol/L (ref 3.5–5.0)
PROTEIN TOTAL: 6.1 g/dL — ABNORMAL LOW (ref 6.5–8.3)
SODIUM: 143 mmol/L (ref 135–145)

## 2019-01-10 MED ORDER — AMLODIPINE 10 MG-VALSARTAN 320 MG TABLET
ORAL_TABLET | Freq: Every day | ORAL | 3 refills | 0 days | Status: CP
Start: 2019-01-10 — End: 2019-05-14

## 2019-01-10 NOTE — Unmapped (Addendum)
--   Please come back in April, we will increase your hydralazine if your BP is still high.  -- I have changed losartan to Exforge.  -- Please keep up your appointment w/ [name]

## 2019-01-10 NOTE — Unmapped (Signed)
INTERNAL MEDICINE [facility] CLINIC NOTE  01/11/2019    PCP: [name], MD     Assessment and Plan    Hypertension  Poorly controlled. BP was 155/67 despite up-titrating his hydralazine to 100mg  TID.   -- Added amlodipine 10 mg, has had previously documented pedal edema but would like to try it if the patient is not bothered by it.   -- Amlodipine- Valsartan 10-320 mg daily  -- If still poorly controlled will increase hydralazine further to 125 mg TID      CKD stage 3 due to type 2 diabetes mellitus (CMS-HCC)  Is yet to see [name] (nephrology), visit planned for May. Continues to have worsening Renal function. Cr on 03/02 was 2.3. No need for dialysis at this point.   -- Recheck Cr and PTH, Ca next visit.   -- Medicines renally dosed.   -- Will also reach to [name] about the rapid decline in his GFR.      Diabetes  Continues to have good Glycemic control. Is on Long acting and short acting insulin at home.   --Continue to follow along w/ Dr Julian Reil.     Anemia of chronic disease  Hb down further to 10 and has low normal Iron studies.   -- Reached out to Dr. Tammi Klippel for further instructions. Most likely will be addressed during his next visit w/ him in May.     Hypothyroidism  Recheck TSH today.  -- Will increase by 12 mcg if TSH still elevated.        Follow up as scheduled or sooner as needed.    Patient was seen and discussed with Dr. Irena Cords who is in agreement with the assessment and plan as outlined above.       Subjective  Michael Stokes is a 84 y.o. year old male with the above problem list who presents to the clinic for   Chief Complaint   Patient presents with   ??? Follow-up   ??? Hypertension     Feels well. No new complaints. Here to get his BP checked and get refill on his losartan.     Meds and allergies were reviewed in Epic    ROS: 10 point ROS was performed and is otherwise negative other than mentioned in the HPI    Objective  PE:  Vitals:    01/10/19 1323   BP: 155/67   Pulse: 84   SpO2: 95% General: well-appearing in NAD  Eyes: EOMI, sclera clear, PERRL  ENT: OP clear w/o erythema or exudate  CV: regular, no murmurs  Resp: CTAB, no wheezes or crackles, normal WOB  GI: soft, NTND, NABS  MSK: full ROM, no deformity noted  Skin: clean and dry, no rashes or lesions noted  Ext: 1+ pitting edema b/l and b/l varicose veins visible.   Neuro: alert, follows commands. CN II-XII grossly intact

## 2019-01-12 MED ORDER — LOSARTAN 100 MG TABLET
ORAL_TABLET | Freq: Every day | ORAL | 3 refills | 0.00000 days | Status: CP
Start: 2019-01-12 — End: 2019-02-09

## 2019-01-12 MED ORDER — LEVOTHYROXINE 25 MCG TABLET
ORAL_TABLET | Freq: Every day | ORAL | 3 refills | 0.00000 days | Status: CP
Start: 2019-01-12 — End: 2020-01-12

## 2019-01-12 NOTE — Unmapped (Signed)
Spoke to the patient and informed him to increase his dose of levothyroxine to 37.5 mcg. Also spoke to him and let him know that [name] will likely help set him up w/ a nephrologist is [place] to expedite the process.

## 2019-01-12 NOTE — Unmapped (Addendum)
Poorly controlled. BP was 155/67 despite up-titrating his hydralazine to 100mg  TID.   -- Added amlodipine 10 mg, has had previously documented pedal edema but would like to try it if the patient is not bothered by it.   -- Amlodipine- Valsartan 10-320 mg daily  -- Hydralazine 100 mg TID  -- If still poorly controlled will increase hydralazine further to 125 mg TID, aldactone not recommended for pts w/ GFR<30

## 2019-01-12 NOTE — Unmapped (Signed)
Hb down further to 10 and has low normal Iron studies.   -- Reached out to Dr. Tammi Klippel for further instructions. Most likely will be addressed during his next visit w/ him in May.

## 2019-01-12 NOTE — Unmapped (Signed)
Continues to have good Glycemic control. Is on Long acting and short acting insulin at home.   --Continue to follow along w/ Dr Julian Reil.

## 2019-01-12 NOTE — Unmapped (Addendum)
Is yet to see [name] (nephrology), visit planned for May. Continues to have worsening Renal function. Cr on 03/02 was 2.3. No need for dialysis at this point.   -- Recheck Cr and PTH, Ca next visit.   -- Medicines renally dosed.   -- Will also reach to [name] about the rapid decline in his GFR.

## 2019-01-12 NOTE — Unmapped (Signed)
Recheck TSH today.  -- Will increase by 12 mcg if TSH still elevated.

## 2019-01-24 MED ORDER — ATORVASTATIN 40 MG TABLET
ORAL_TABLET | Freq: Every day | ORAL | 3 refills | 0.00000 days | Status: CP
Start: 2019-01-24 — End: 2019-04-25

## 2019-01-24 MED ORDER — LANTUS SOLOSTAR U-100 INSULIN 100 UNIT/ML (3 ML) SUBCUTANEOUS PEN
Freq: Every day | SUBCUTANEOUS | 0 refills | 0.00000 days
Start: 2019-01-24 — End: 2019-03-30

## 2019-02-08 ENCOUNTER — Institutional Professional Consult (permissible substitution): Admit: 2019-02-08 | Discharge: 2019-02-09 | Payer: PRIVATE HEALTH INSURANCE

## 2019-02-08 DIAGNOSIS — E1122 Type 2 diabetes mellitus with diabetic chronic kidney disease: Secondary | ICD-10-CM | POA: Diagnosis not present

## 2019-02-08 DIAGNOSIS — N183 Chronic kidney disease, stage 3 (moderate): Secondary | ICD-10-CM | POA: Diagnosis not present

## 2019-02-09 DIAGNOSIS — I129 Hypertensive chronic kidney disease with stage 1 through stage 4 chronic kidney disease, or unspecified chronic kidney disease: Secondary | ICD-10-CM | POA: Diagnosis not present

## 2019-02-09 DIAGNOSIS — N183 Chronic kidney disease, stage 3 (moderate): Secondary | ICD-10-CM | POA: Diagnosis not present

## 2019-02-09 DIAGNOSIS — D631 Anemia in chronic kidney disease: Secondary | ICD-10-CM | POA: Diagnosis not present

## 2019-02-17 ENCOUNTER — Institutional Professional Consult (permissible substitution): Admit: 2019-02-17 | Discharge: 2019-02-17 | Payer: PRIVATE HEALTH INSURANCE

## 2019-02-17 ENCOUNTER — Other Ambulatory Visit: Admit: 2019-02-17 | Discharge: 2019-02-17 | Payer: PRIVATE HEALTH INSURANCE

## 2019-02-17 ENCOUNTER — Institutional Professional Consult (permissible substitution)
Admit: 2019-02-17 | Discharge: 2019-02-17 | Payer: PRIVATE HEALTH INSURANCE | Attending: Nurse Practitioner | Primary: Nurse Practitioner

## 2019-02-17 DIAGNOSIS — I5032 Chronic diastolic (congestive) heart failure: Secondary | ICD-10-CM | POA: Diagnosis not present

## 2019-02-17 DIAGNOSIS — N183 Chronic kidney disease, stage 3 (moderate): Secondary | ICD-10-CM | POA: Diagnosis not present

## 2019-02-17 DIAGNOSIS — Z9861 Coronary angioplasty status: Secondary | ICD-10-CM | POA: Diagnosis not present

## 2019-02-17 DIAGNOSIS — I10 Essential (primary) hypertension: Secondary | ICD-10-CM | POA: Diagnosis not present

## 2019-02-17 DIAGNOSIS — Z20828 Contact with and (suspected) exposure to other viral communicable diseases: Secondary | ICD-10-CM | POA: Diagnosis not present

## 2019-02-17 DIAGNOSIS — Z7189 Other specified counseling: Secondary | ICD-10-CM | POA: Diagnosis not present

## 2019-02-17 DIAGNOSIS — I2510 Atherosclerotic heart disease of native coronary artery without angina pectoris: Secondary | ICD-10-CM | POA: Diagnosis not present

## 2019-02-17 DIAGNOSIS — C61 Malignant neoplasm of prostate: Secondary | ICD-10-CM | POA: Diagnosis not present

## 2019-02-17 DIAGNOSIS — E1122 Type 2 diabetes mellitus with diabetic chronic kidney disease: Secondary | ICD-10-CM | POA: Diagnosis not present

## 2019-02-17 DIAGNOSIS — E785 Hyperlipidemia, unspecified: Secondary | ICD-10-CM | POA: Diagnosis not present

## 2019-02-24 MED ORDER — AMITRIPTYLINE 25 MG TABLET
ORAL_TABLET | 3 refills | 0 days | Status: CP
Start: 2019-02-24 — End: ?

## 2019-02-24 MED ORDER — BLOOD SUGAR DIAGNOSTIC STRIPS
ORAL_STRIP | Freq: Two times a day (BID) | 11 refills | 0 days | Status: CP
Start: 2019-02-24 — End: ?

## 2019-03-17 ENCOUNTER — Institutional Professional Consult (permissible substitution): Admit: 2019-03-17 | Discharge: 2019-03-18 | Payer: PRIVATE HEALTH INSURANCE

## 2019-03-17 ENCOUNTER — Ambulatory Visit: Admit: 2019-03-17 | Discharge: 2019-03-18 | Payer: PRIVATE HEALTH INSURANCE

## 2019-03-17 DIAGNOSIS — E119 Type 2 diabetes mellitus without complications: Secondary | ICD-10-CM | POA: Diagnosis not present

## 2019-03-17 DIAGNOSIS — Z794 Long term (current) use of insulin: Secondary | ICD-10-CM | POA: Diagnosis not present

## 2019-03-17 DIAGNOSIS — I10 Essential (primary) hypertension: Secondary | ICD-10-CM | POA: Diagnosis not present

## 2019-03-17 DIAGNOSIS — E118 Type 2 diabetes mellitus with unspecified complications: Principal | ICD-10-CM

## 2019-03-22 ENCOUNTER — Ambulatory Visit: Admit: 2019-03-22 | Discharge: 2019-03-23 | Payer: PRIVATE HEALTH INSURANCE

## 2019-03-22 DIAGNOSIS — Z794 Long term (current) use of insulin: Secondary | ICD-10-CM | POA: Diagnosis not present

## 2019-03-22 DIAGNOSIS — E875 Hyperkalemia: Secondary | ICD-10-CM | POA: Diagnosis not present

## 2019-03-22 DIAGNOSIS — E118 Type 2 diabetes mellitus with unspecified complications: Secondary | ICD-10-CM | POA: Diagnosis not present

## 2019-03-30 ENCOUNTER — Institutional Professional Consult (permissible substitution)
Admit: 2019-03-30 | Discharge: 2019-03-31 | Payer: PRIVATE HEALTH INSURANCE | Attending: Student in an Organized Health Care Education/Training Program | Primary: Student in an Organized Health Care Education/Training Program

## 2019-03-30 DIAGNOSIS — E039 Hypothyroidism, unspecified: Secondary | ICD-10-CM | POA: Diagnosis not present

## 2019-03-30 DIAGNOSIS — E1122 Type 2 diabetes mellitus with diabetic chronic kidney disease: Secondary | ICD-10-CM | POA: Diagnosis not present

## 2019-03-30 DIAGNOSIS — N183 Chronic kidney disease, stage 3 (moderate): Secondary | ICD-10-CM | POA: Diagnosis not present

## 2019-03-30 DIAGNOSIS — I129 Hypertensive chronic kidney disease with stage 1 through stage 4 chronic kidney disease, or unspecified chronic kidney disease: Secondary | ICD-10-CM | POA: Diagnosis not present

## 2019-03-30 DIAGNOSIS — Z794 Long term (current) use of insulin: Secondary | ICD-10-CM | POA: Diagnosis not present

## 2019-03-30 DIAGNOSIS — E118 Type 2 diabetes mellitus with unspecified complications: Principal | ICD-10-CM

## 2019-03-30 MED ORDER — LANTUS SOLOSTAR U-100 INSULIN 100 UNIT/ML (3 ML) SUBCUTANEOUS PEN
Freq: Every day | SUBCUTANEOUS | 0 refills | 0 days
Start: 2019-03-30 — End: 2019-04-25

## 2019-04-25 MED ORDER — ATORVASTATIN 40 MG TABLET
ORAL_TABLET | Freq: Every day | ORAL | 3 refills | 0 days | Status: CP
Start: 2019-04-25 — End: ?

## 2019-04-25 MED ORDER — LANTUS SOLOSTAR U-100 INSULIN 100 UNIT/ML (3 ML) SUBCUTANEOUS PEN
Freq: Every day | SUBCUTANEOUS | 0 refills | 0 days
Start: 2019-04-25 — End: 2019-06-17

## 2019-05-16 MED ORDER — AMLODIPINE 10 MG-VALSARTAN 320 MG TABLET
ORAL_TABLET | Freq: Every day | ORAL | 0 refills | 0.00000 days | Status: CP
Start: 2019-05-16 — End: 2019-06-17

## 2019-05-19 ENCOUNTER — Ambulatory Visit: Admit: 2019-05-19 | Discharge: 2019-05-19 | Payer: PRIVATE HEALTH INSURANCE

## 2019-05-19 ENCOUNTER — Institutional Professional Consult (permissible substitution)
Admit: 2019-05-19 | Discharge: 2019-05-19 | Payer: PRIVATE HEALTH INSURANCE | Attending: Nurse Practitioner | Primary: Nurse Practitioner

## 2019-05-19 ENCOUNTER — Institutional Professional Consult (permissible substitution): Admit: 2019-05-19 | Discharge: 2019-05-19 | Payer: PRIVATE HEALTH INSURANCE

## 2019-05-19 DIAGNOSIS — E1122 Type 2 diabetes mellitus with diabetic chronic kidney disease: Secondary | ICD-10-CM | POA: Diagnosis not present

## 2019-05-19 DIAGNOSIS — R197 Diarrhea, unspecified: Secondary | ICD-10-CM | POA: Diagnosis not present

## 2019-05-19 DIAGNOSIS — C61 Malignant neoplasm of prostate: Secondary | ICD-10-CM | POA: Diagnosis not present

## 2019-05-19 DIAGNOSIS — I129 Hypertensive chronic kidney disease with stage 1 through stage 4 chronic kidney disease, or unspecified chronic kidney disease: Secondary | ICD-10-CM | POA: Diagnosis not present

## 2019-05-19 DIAGNOSIS — N189 Chronic kidney disease, unspecified: Secondary | ICD-10-CM | POA: Diagnosis not present

## 2019-05-19 DIAGNOSIS — E1165 Type 2 diabetes mellitus with hyperglycemia: Secondary | ICD-10-CM | POA: Diagnosis not present

## 2019-05-30 MED ORDER — ALLOPURINOL 100 MG TABLET
ORAL_TABLET | Freq: Every day | ORAL | 3 refills | 360 days | Status: CP
Start: 2019-05-30 — End: 2020-05-29

## 2019-05-30 MED ORDER — HYDRALAZINE 100 MG TABLET
ORAL_TABLET | Freq: Three times a day (TID) | ORAL | 11 refills | 40 days | Status: CP
Start: 2019-05-30 — End: 2020-05-29

## 2019-06-17 MED ORDER — VENLAFAXINE ER 75 MG CAPSULE,EXTENDED RELEASE 24 HR
ORAL_CAPSULE | Freq: Every day | ORAL | 2 refills | 30.00000 days
Start: 2019-06-17 — End: 2019-09-15

## 2019-06-17 MED ORDER — LANTUS SOLOSTAR U-100 INSULIN 100 UNIT/ML (3 ML) SUBCUTANEOUS PEN
Freq: Every day | SUBCUTANEOUS | 0 refills | 53.00000 days
Start: 2019-06-17 — End: 2019-06-28

## 2019-06-20 MED ORDER — AMLODIPINE 10 MG-VALSARTAN 320 MG TABLET
ORAL_TABLET | Freq: Every day | ORAL | 0 refills | 30.00000 days | Status: CP
Start: 2019-06-20 — End: 2019-07-20

## 2019-06-28 MED ORDER — LANTUS SOLOSTAR U-100 INSULIN 100 UNIT/ML (3 ML) SUBCUTANEOUS PEN
Freq: Every day | SUBCUTANEOUS | 0 refills | 53.00000 days
Start: 2019-06-28 — End: 2019-07-04

## 2019-07-04 MED ORDER — LANTUS SOLOSTAR U-100 INSULIN 100 UNIT/ML (3 ML) SUBCUTANEOUS PEN
0 refills | 0 days | Status: CP
Start: 2019-07-04 — End: ?

## 2019-07-06 DIAGNOSIS — E11649 Type 2 diabetes mellitus with hypoglycemia without coma: Secondary | ICD-10-CM | POA: Diagnosis not present

## 2019-07-06 DIAGNOSIS — I10 Essential (primary) hypertension: Secondary | ICD-10-CM | POA: Diagnosis not present

## 2019-07-11 MED ORDER — CARVEDILOL 25 MG TABLET
ORAL_TABLET | Freq: Two times a day (BID) | ORAL | 11 refills | 30 days | Status: CP
Start: 2019-07-11 — End: 2020-07-10

## 2019-07-21 ENCOUNTER — Ambulatory Visit: Admit: 2019-07-21 | Discharge: 2019-07-21 | Payer: PRIVATE HEALTH INSURANCE

## 2019-07-21 DIAGNOSIS — Z23 Encounter for immunization: Secondary | ICD-10-CM | POA: Diagnosis not present

## 2019-07-21 DIAGNOSIS — E119 Type 2 diabetes mellitus without complications: Secondary | ICD-10-CM | POA: Diagnosis not present

## 2019-07-21 DIAGNOSIS — Z794 Long term (current) use of insulin: Secondary | ICD-10-CM | POA: Diagnosis not present

## 2019-07-21 DIAGNOSIS — E118 Type 2 diabetes mellitus with unspecified complications: Secondary | ICD-10-CM | POA: Diagnosis not present

## 2019-07-21 DIAGNOSIS — I10 Essential (primary) hypertension: Secondary | ICD-10-CM | POA: Diagnosis not present

## 2019-07-21 DIAGNOSIS — E11649 Type 2 diabetes mellitus with hypoglycemia without coma: Secondary | ICD-10-CM | POA: Diagnosis not present

## 2019-07-21 MED ORDER — AMLODIPINE 10 MG-VALSARTAN 320 MG TABLET
ORAL_TABLET | Freq: Every day | ORAL | 3 refills | 90 days | Status: CP
Start: 2019-07-21 — End: 2019-08-20

## 2019-07-21 MED ORDER — ATORVASTATIN 40 MG TABLET: 40 mg | tablet | Freq: Every day | 3 refills | 90 days | Status: AC

## 2019-07-21 MED ORDER — LANTUS SOLOSTAR U-100 INSULIN 100 UNIT/ML (3 ML) SUBCUTANEOUS PEN
0 refills | 0 days
Start: 2019-07-21 — End: ?

## 2019-07-21 MED ORDER — ATORVASTATIN 40 MG TABLET
ORAL_TABLET | Freq: Every day | ORAL | 3 refills | 90.00000 days | Status: CP
Start: 2019-07-21 — End: 2019-07-21

## 2019-08-16 MED ORDER — ALLOPURINOL 100 MG TABLET
ORAL_TABLET | Freq: Every day | ORAL | 3 refills | 180.00000 days | Status: CP
Start: 2019-08-16 — End: ?

## 2019-08-19 ENCOUNTER — Ambulatory Visit: Admit: 2019-08-19 | Discharge: 2019-08-19 | Payer: PRIVATE HEALTH INSURANCE

## 2019-08-19 ENCOUNTER — Institutional Professional Consult (permissible substitution): Admit: 2019-08-19 | Discharge: 2019-08-19 | Payer: PRIVATE HEALTH INSURANCE

## 2019-08-19 DIAGNOSIS — Z79818 Long term (current) use of other agents affecting estrogen receptors and estrogen levels: Secondary | ICD-10-CM | POA: Diagnosis not present

## 2019-08-19 DIAGNOSIS — C61 Malignant neoplasm of prostate: Secondary | ICD-10-CM | POA: Diagnosis not present

## 2019-08-22 ENCOUNTER — Institutional Professional Consult (permissible substitution)
Admit: 2019-08-22 | Discharge: 2019-08-23 | Payer: PRIVATE HEALTH INSURANCE | Attending: Nurse Practitioner | Primary: Nurse Practitioner

## 2019-08-22 DIAGNOSIS — N189 Chronic kidney disease, unspecified: Secondary | ICD-10-CM | POA: Diagnosis not present

## 2019-08-22 DIAGNOSIS — C61 Malignant neoplasm of prostate: Secondary | ICD-10-CM | POA: Diagnosis not present

## 2019-08-26 ENCOUNTER — Telehealth: Admit: 2019-08-26 | Discharge: 2019-08-27 | Payer: PRIVATE HEALTH INSURANCE

## 2019-08-26 DIAGNOSIS — E118 Type 2 diabetes mellitus with unspecified complications: Secondary | ICD-10-CM | POA: Diagnosis not present

## 2019-08-26 DIAGNOSIS — Z794 Long term (current) use of insulin: Secondary | ICD-10-CM | POA: Diagnosis not present

## 2019-08-26 DIAGNOSIS — I10 Essential (primary) hypertension: Secondary | ICD-10-CM | POA: Diagnosis not present

## 2019-08-31 MED ORDER — LEVOTHYROXINE 25 MCG TABLET: 38 ug | tablet | Freq: Every day | 3 refills | 60 days | Status: AC

## 2019-09-06 MED ORDER — VENLAFAXINE ER 75 MG CAPSULE,EXTENDED RELEASE 24 HR
ORAL_CAPSULE | Freq: Every day | ORAL | 2 refills | 30 days
Start: 2019-09-06 — End: 2019-12-05

## 2019-09-06 MED ORDER — LANTUS SOLOSTAR U-100 INSULIN 100 UNIT/ML (3 ML) SUBCUTANEOUS PEN
0 refills | 0 days
Start: 2019-09-06 — End: ?

## 2019-09-09 DIAGNOSIS — C61 Malignant neoplasm of prostate: Principal | ICD-10-CM

## 2019-09-16 MED ORDER — VENLAFAXINE ER 75 MG CAPSULE,EXTENDED RELEASE 24 HR
ORAL_CAPSULE | Freq: Every day | ORAL | 2 refills | 90.00000 days | Status: CP
Start: 2019-09-16 — End: 2019-09-16

## 2019-09-16 MED ORDER — LANTUS SOLOSTAR U-100 INSULIN 100 UNIT/ML (3 ML) SUBCUTANEOUS PEN
0 refills | 0.00000 days | Status: CP
Start: 2019-09-16 — End: 2019-09-16

## 2019-09-16 MED ORDER — VENLAFAXINE ER 75 MG CAPSULE,EXTENDED RELEASE 24 HR: 75 mg | capsule | Freq: Every day | 2 refills | 90 days | Status: AC

## 2019-09-16 MED ORDER — LANTUS SOLOSTAR U-100 INSULIN 100 UNIT/ML (3 ML) SUBCUTANEOUS PEN: mL | 0 refills | 0 days

## 2019-09-16 MED ORDER — LANTUS SOLOSTAR U-100 INSULIN 100 UNIT/ML (3 ML) SUBCUTANEOUS PEN: mL | 0 refills | 0 days | Status: AC

## 2019-09-26 ENCOUNTER — Institutional Professional Consult (permissible substitution): Admit: 2019-09-26 | Discharge: 2019-09-27 | Payer: PRIVATE HEALTH INSURANCE

## 2019-09-26 DIAGNOSIS — I10 Essential (primary) hypertension: Secondary | ICD-10-CM | POA: Diagnosis not present

## 2019-09-26 DIAGNOSIS — Z794 Long term (current) use of insulin: Secondary | ICD-10-CM | POA: Diagnosis not present

## 2019-09-26 DIAGNOSIS — E118 Type 2 diabetes mellitus with unspecified complications: Secondary | ICD-10-CM | POA: Diagnosis not present

## 2019-09-26 MED ORDER — LANTUS SOLOSTAR U-100 INSULIN 100 UNIT/ML (3 ML) SUBCUTANEOUS PEN
prn refills | 0 days | Status: CP
Start: 2019-09-26 — End: ?

## 2019-11-15 ENCOUNTER — Ambulatory Visit: Admit: 2019-11-15 | Discharge: 2019-11-16 | Payer: PRIVATE HEALTH INSURANCE

## 2019-11-15 DIAGNOSIS — I10 Essential (primary) hypertension: Secondary | ICD-10-CM | POA: Diagnosis not present

## 2019-11-15 DIAGNOSIS — E7800 Pure hypercholesterolemia, unspecified: Secondary | ICD-10-CM | POA: Diagnosis not present

## 2019-11-15 DIAGNOSIS — E118 Type 2 diabetes mellitus with unspecified complications: Secondary | ICD-10-CM | POA: Diagnosis not present

## 2019-11-15 DIAGNOSIS — Z794 Long term (current) use of insulin: Secondary | ICD-10-CM | POA: Diagnosis not present

## 2019-12-02 DIAGNOSIS — C61 Malignant neoplasm of prostate: Principal | ICD-10-CM

## 2019-12-05 ENCOUNTER — Other Ambulatory Visit: Admit: 2019-12-05 | Discharge: 2019-12-05 | Payer: PRIVATE HEALTH INSURANCE

## 2019-12-05 ENCOUNTER — Ambulatory Visit
Admit: 2019-12-05 | Discharge: 2019-12-05 | Payer: PRIVATE HEALTH INSURANCE | Attending: Nurse Practitioner | Primary: Nurse Practitioner

## 2019-12-05 DIAGNOSIS — E1122 Type 2 diabetes mellitus with diabetic chronic kidney disease: Secondary | ICD-10-CM | POA: Diagnosis not present

## 2019-12-05 DIAGNOSIS — K219 Gastro-esophageal reflux disease without esophagitis: Secondary | ICD-10-CM | POA: Diagnosis not present

## 2019-12-05 DIAGNOSIS — N189 Chronic kidney disease, unspecified: Secondary | ICD-10-CM | POA: Diagnosis not present

## 2019-12-05 DIAGNOSIS — C61 Malignant neoplasm of prostate: Secondary | ICD-10-CM | POA: Diagnosis not present

## 2019-12-05 DIAGNOSIS — E1142 Type 2 diabetes mellitus with diabetic polyneuropathy: Secondary | ICD-10-CM | POA: Diagnosis not present

## 2019-12-05 DIAGNOSIS — F329 Major depressive disorder, single episode, unspecified: Secondary | ICD-10-CM | POA: Diagnosis not present

## 2019-12-05 DIAGNOSIS — Z923 Personal history of irradiation: Secondary | ICD-10-CM | POA: Diagnosis not present

## 2019-12-05 DIAGNOSIS — I129 Hypertensive chronic kidney disease with stage 1 through stage 4 chronic kidney disease, or unspecified chronic kidney disease: Secondary | ICD-10-CM | POA: Diagnosis not present

## 2019-12-05 DIAGNOSIS — Z79899 Other long term (current) drug therapy: Secondary | ICD-10-CM | POA: Diagnosis not present

## 2019-12-05 DIAGNOSIS — Z7982 Long term (current) use of aspirin: Secondary | ICD-10-CM | POA: Diagnosis not present

## 2019-12-19 MED ORDER — LANTUS SOLOSTAR U-100 INSULIN 100 UNIT/ML (3 ML) SUBCUTANEOUS PEN
prn refills | 0 days | Status: CP
Start: 2019-12-19 — End: ?

## 2019-12-27 DIAGNOSIS — C61 Malignant neoplasm of prostate: Principal | ICD-10-CM

## 2019-12-27 MED ORDER — XTANDI 40 MG CAPSULE
ORAL_CAPSULE | Freq: Every day | ORAL | 2 refills | 30.00000 days | Status: CP
Start: 2019-12-27 — End: 2019-12-27

## 2019-12-27 MED ORDER — XTANDI 40 MG CAPSULE: 40 mg | capsule | Freq: Every day | 2 refills | 30 days | Status: AC

## 2020-02-22 ENCOUNTER — Ambulatory Visit: Admit: 2020-02-22 | Discharge: 2020-02-23

## 2020-02-22 DIAGNOSIS — E119 Type 2 diabetes mellitus without complications: Principal | ICD-10-CM

## 2020-02-22 DIAGNOSIS — E7800 Pure hypercholesterolemia, unspecified: Principal | ICD-10-CM

## 2020-02-22 DIAGNOSIS — Z794 Long term (current) use of insulin: Principal | ICD-10-CM

## 2020-02-22 DIAGNOSIS — I10 Essential (primary) hypertension: Principal | ICD-10-CM

## 2020-02-22 DIAGNOSIS — E118 Type 2 diabetes mellitus with unspecified complications: Secondary | ICD-10-CM

## 2020-02-22 MED ORDER — LANTUS SOLOSTAR U-100 INSULIN 100 UNIT/ML (3 ML) SUBCUTANEOUS PEN
prn refills | 0.00000 days | Status: CP
Start: 2020-02-22 — End: ?

## 2020-02-22 MED ORDER — AMITRIPTYLINE 25 MG TABLET
ORAL_TABLET | 3 refills | 0 days | Status: CP
Start: 2020-02-22 — End: ?

## 2020-03-02 DIAGNOSIS — C61 Malignant neoplasm of prostate: Principal | ICD-10-CM

## 2020-03-05 ENCOUNTER — Other Ambulatory Visit: Admit: 2020-03-05 | Discharge: 2020-03-05 | Payer: PRIVATE HEALTH INSURANCE

## 2020-03-05 ENCOUNTER — Ambulatory Visit
Admit: 2020-03-05 | Discharge: 2020-03-05 | Payer: PRIVATE HEALTH INSURANCE | Attending: Nurse Practitioner | Primary: Nurse Practitioner

## 2020-03-05 DIAGNOSIS — C61 Malignant neoplasm of prostate: Secondary | ICD-10-CM | POA: Diagnosis not present

## 2020-03-05 DIAGNOSIS — I129 Hypertensive chronic kidney disease with stage 1 through stage 4 chronic kidney disease, or unspecified chronic kidney disease: Secondary | ICD-10-CM | POA: Diagnosis not present

## 2020-03-05 DIAGNOSIS — E1142 Type 2 diabetes mellitus with diabetic polyneuropathy: Secondary | ICD-10-CM | POA: Diagnosis not present

## 2020-03-05 DIAGNOSIS — F329 Major depressive disorder, single episode, unspecified: Secondary | ICD-10-CM | POA: Diagnosis not present

## 2020-03-05 DIAGNOSIS — E1122 Type 2 diabetes mellitus with diabetic chronic kidney disease: Secondary | ICD-10-CM | POA: Diagnosis not present

## 2020-03-05 DIAGNOSIS — N189 Chronic kidney disease, unspecified: Secondary | ICD-10-CM | POA: Diagnosis not present

## 2020-05-02 MED ORDER — LEVOTHYROXINE 25 MCG TABLET
ORAL_TABLET | Freq: Every day | ORAL | 3 refills | 60.00000 days | Status: CP
Start: 2020-05-02 — End: ?

## 2020-05-28 ENCOUNTER — Ambulatory Visit: Admit: 2020-05-28 | Discharge: 2020-05-29 | Payer: PRIVATE HEALTH INSURANCE

## 2020-05-28 DIAGNOSIS — N183 CKD stage 3 due to type 2 diabetes mellitus (CMS-HCC): Secondary | ICD-10-CM

## 2020-05-28 DIAGNOSIS — E1122 Type 2 diabetes mellitus with diabetic chronic kidney disease: Secondary | ICD-10-CM | POA: Diagnosis not present

## 2020-05-28 DIAGNOSIS — I10 Essential (primary) hypertension: Secondary | ICD-10-CM | POA: Diagnosis not present

## 2020-05-28 DIAGNOSIS — E7800 Pure hypercholesterolemia, unspecified: Secondary | ICD-10-CM | POA: Diagnosis not present

## 2020-05-28 MED ORDER — ALLOPURINOL 100 MG TABLET
ORAL_TABLET | Freq: Every day | ORAL | 3 refills | 180 days | Status: CP
Start: 2020-05-28 — End: ?

## 2020-05-28 MED ORDER — AMITRIPTYLINE 25 MG TABLET
ORAL_TABLET | 3 refills | 0 days | Status: CP
Start: 2020-05-28 — End: ?

## 2020-06-04 ENCOUNTER — Ambulatory Visit
Admit: 2020-06-04 | Discharge: 2020-06-04 | Payer: PRIVATE HEALTH INSURANCE | Attending: Nurse Practitioner | Primary: Nurse Practitioner

## 2020-06-04 ENCOUNTER — Other Ambulatory Visit: Admit: 2020-06-04 | Discharge: 2020-06-04 | Payer: PRIVATE HEALTH INSURANCE

## 2020-06-04 DIAGNOSIS — Z5181 Encounter for therapeutic drug level monitoring: Secondary | ICD-10-CM | POA: Diagnosis not present

## 2020-06-04 DIAGNOSIS — N183 CKD stage 3 due to type 2 diabetes mellitus (CMS-HCC): Principal | ICD-10-CM

## 2020-06-04 DIAGNOSIS — E1122 Type 2 diabetes mellitus with diabetic chronic kidney disease: Secondary | ICD-10-CM | POA: Diagnosis not present

## 2020-06-04 DIAGNOSIS — C61 Malignant neoplasm of prostate: Secondary | ICD-10-CM | POA: Diagnosis not present

## 2020-06-04 DIAGNOSIS — C7800 Secondary malignant neoplasm of unspecified lung: Secondary | ICD-10-CM | POA: Diagnosis not present

## 2020-06-21 MED ORDER — VENLAFAXINE ER 75 MG CAPSULE,EXTENDED RELEASE 24 HR: 75 mg | capsule | Freq: Every day | 2 refills | 90 days | Status: AC

## 2020-06-21 MED ORDER — VENLAFAXINE ER 75 MG CAPSULE,EXTENDED RELEASE 24 HR
ORAL_CAPSULE | Freq: Every day | ORAL | 2 refills | 90.00000 days | Status: CP
Start: 2020-06-21 — End: 2020-06-21

## 2020-07-09 MED ORDER — ATORVASTATIN 40 MG TABLET
ORAL_TABLET | Freq: Every day | ORAL | 3 refills | 90.00000 days
Start: 2020-07-09 — End: ?

## 2020-07-09 MED ORDER — CARVEDILOL 25 MG TABLET
ORAL_TABLET | Freq: Two times a day (BID) | ORAL | 11 refills | 30 days | Status: CP
Start: 2020-07-09 — End: 2021-07-09

## 2020-07-09 MED ORDER — AMLODIPINE 10 MG-VALSARTAN 320 MG TABLET
ORAL_TABLET | Freq: Every day | ORAL | 3 refills | 90.00000 days
Start: 2020-07-09 — End: 2020-08-08

## 2020-07-10 MED ORDER — AMLODIPINE 10 MG-VALSARTAN 320 MG TABLET
ORAL_TABLET | Freq: Every day | ORAL | 3 refills | 90.00000 days | Status: CP
Start: 2020-07-10 — End: 2020-08-09

## 2020-07-27 MED ORDER — LEVOTHYROXINE 25 MCG TABLET
ORAL_TABLET | Freq: Every day | ORAL | 3 refills | 60.00000 days | Status: CP
Start: 2020-07-27 — End: ?

## 2020-08-16 DIAGNOSIS — N183 CKD stage 3 due to type 2 diabetes mellitus (CMS-HCC): Principal | ICD-10-CM

## 2020-08-16 DIAGNOSIS — E119 Type 2 diabetes mellitus without complications: Principal | ICD-10-CM

## 2020-08-16 DIAGNOSIS — E1122 Type 2 diabetes mellitus with diabetic chronic kidney disease: Principal | ICD-10-CM

## 2020-08-16 MED ORDER — ALLOPURINOL 100 MG TABLET
ORAL_TABLET | Freq: Every day | ORAL | 3 refills | 180.00000 days | Status: CP
Start: 2020-08-16 — End: ?

## 2020-08-16 MED ORDER — LANTUS SOLOSTAR U-100 INSULIN 100 UNIT/ML (3 ML) SUBCUTANEOUS PEN
prn refills | 0.00000 days | Status: CP
Start: 2020-08-16 — End: ?

## 2020-08-23 MED ORDER — AMITRIPTYLINE 25 MG TABLET
ORAL_TABLET | 3 refills | 0.00000 days
Start: 2020-08-23 — End: ?

## 2020-08-23 MED ORDER — HYDRALAZINE 100 MG TABLET
ORAL_TABLET | Freq: Three times a day (TID) | ORAL | 11 refills | 40.00000 days
Start: 2020-08-23 — End: 2021-08-23

## 2020-08-24 MED ORDER — AMITRIPTYLINE 25 MG TABLET
ORAL_TABLET | ORAL | 3 refills | 0.00000 days | Status: CP
Start: 2020-08-24 — End: ?

## 2020-08-28 DIAGNOSIS — C61 Malignant neoplasm of prostate: Principal | ICD-10-CM

## 2020-08-28 DIAGNOSIS — C7800 Secondary malignant neoplasm of unspecified lung: Principal | ICD-10-CM

## 2020-09-03 ENCOUNTER — Ambulatory Visit: Admit: 2020-09-03 | Discharge: 2020-09-05 | Payer: PRIVATE HEALTH INSURANCE

## 2020-09-03 ENCOUNTER — Ambulatory Visit: Admit: 2020-09-03 | Discharge: 2020-10-02 | Payer: PRIVATE HEALTH INSURANCE

## 2020-09-03 ENCOUNTER — Ambulatory Visit: Admit: 2020-09-03 | Discharge: 2020-09-03 | Payer: PRIVATE HEALTH INSURANCE

## 2020-09-03 DIAGNOSIS — C7800 Secondary malignant neoplasm of unspecified lung: Secondary | ICD-10-CM | POA: Diagnosis not present

## 2020-09-03 DIAGNOSIS — K4020 Bilateral inguinal hernia, without obstruction or gangrene, not specified as recurrent: Secondary | ICD-10-CM | POA: Diagnosis not present

## 2020-09-03 DIAGNOSIS — C61 Malignant neoplasm of prostate: Secondary | ICD-10-CM | POA: Diagnosis not present

## 2020-09-05 DIAGNOSIS — C61 Malignant neoplasm of prostate: Principal | ICD-10-CM

## 2020-09-06 ENCOUNTER — Ambulatory Visit
Admit: 2020-09-06 | Discharge: 2020-09-06 | Payer: PRIVATE HEALTH INSURANCE | Attending: Nurse Practitioner | Primary: Nurse Practitioner

## 2020-09-06 ENCOUNTER — Other Ambulatory Visit: Admit: 2020-09-06 | Discharge: 2020-09-06 | Payer: PRIVATE HEALTH INSURANCE

## 2020-09-06 DIAGNOSIS — Z7982 Long term (current) use of aspirin: Secondary | ICD-10-CM | POA: Diagnosis not present

## 2020-09-06 DIAGNOSIS — Z9079 Acquired absence of other genital organ(s): Secondary | ICD-10-CM | POA: Diagnosis not present

## 2020-09-06 DIAGNOSIS — M439 Deforming dorsopathy, unspecified: Secondary | ICD-10-CM | POA: Diagnosis not present

## 2020-09-06 DIAGNOSIS — Z794 Long term (current) use of insulin: Secondary | ICD-10-CM | POA: Diagnosis not present

## 2020-09-06 DIAGNOSIS — M8589 Other specified disorders of bone density and structure, multiple sites: Secondary | ICD-10-CM | POA: Diagnosis not present

## 2020-09-06 DIAGNOSIS — E1122 Type 2 diabetes mellitus with diabetic chronic kidney disease: Secondary | ICD-10-CM | POA: Diagnosis not present

## 2020-09-06 DIAGNOSIS — G8929 Other chronic pain: Secondary | ICD-10-CM | POA: Diagnosis not present

## 2020-09-06 DIAGNOSIS — Z79899 Other long term (current) drug therapy: Secondary | ICD-10-CM | POA: Diagnosis not present

## 2020-09-06 DIAGNOSIS — R918 Other nonspecific abnormal finding of lung field: Secondary | ICD-10-CM | POA: Diagnosis not present

## 2020-09-06 DIAGNOSIS — I129 Hypertensive chronic kidney disease with stage 1 through stage 4 chronic kidney disease, or unspecified chronic kidney disease: Secondary | ICD-10-CM | POA: Diagnosis not present

## 2020-09-06 DIAGNOSIS — N189 Chronic kidney disease, unspecified: Secondary | ICD-10-CM | POA: Diagnosis not present

## 2020-09-06 DIAGNOSIS — Z23 Encounter for immunization: Secondary | ICD-10-CM | POA: Diagnosis not present

## 2020-09-06 DIAGNOSIS — M549 Dorsalgia, unspecified: Secondary | ICD-10-CM | POA: Diagnosis not present

## 2020-09-06 DIAGNOSIS — F32A Depression, unspecified: Secondary | ICD-10-CM | POA: Diagnosis not present

## 2020-09-06 DIAGNOSIS — K219 Gastro-esophageal reflux disease without esophagitis: Secondary | ICD-10-CM | POA: Diagnosis not present

## 2020-09-06 DIAGNOSIS — E1142 Type 2 diabetes mellitus with diabetic polyneuropathy: Secondary | ICD-10-CM | POA: Diagnosis not present

## 2020-09-06 DIAGNOSIS — C61 Malignant neoplasm of prostate: Secondary | ICD-10-CM | POA: Diagnosis not present

## 2020-09-27 DIAGNOSIS — C61 Malignant neoplasm of prostate: Principal | ICD-10-CM

## 2020-12-03 ENCOUNTER — Ambulatory Visit: Admit: 2020-12-03 | Discharge: 2020-12-04 | Payer: PRIVATE HEALTH INSURANCE

## 2020-12-03 DIAGNOSIS — E119 Type 2 diabetes mellitus without complications: Secondary | ICD-10-CM | POA: Diagnosis not present

## 2020-12-03 DIAGNOSIS — F3289 Other specified depressive episodes: Secondary | ICD-10-CM | POA: Diagnosis not present

## 2020-12-03 DIAGNOSIS — I10 Essential (primary) hypertension: Secondary | ICD-10-CM | POA: Diagnosis not present

## 2020-12-03 MED ORDER — VENLAFAXINE ER 75 MG CAPSULE,EXTENDED RELEASE 24 HR
ORAL_CAPSULE | Freq: Every day | ORAL | 3 refills | 90 days | Status: CP
Start: 2020-12-03 — End: ?

## 2020-12-05 DIAGNOSIS — C61 Malignant neoplasm of prostate: Principal | ICD-10-CM

## 2020-12-06 ENCOUNTER — Other Ambulatory Visit: Admit: 2020-12-06 | Discharge: 2020-12-07 | Payer: PRIVATE HEALTH INSURANCE

## 2020-12-06 ENCOUNTER — Ambulatory Visit: Admit: 2020-12-06 | Discharge: 2020-12-07 | Payer: PRIVATE HEALTH INSURANCE

## 2020-12-06 ENCOUNTER — Ambulatory Visit
Admit: 2020-12-06 | Discharge: 2020-12-07 | Payer: PRIVATE HEALTH INSURANCE | Attending: Nurse Practitioner | Primary: Nurse Practitioner

## 2020-12-06 DIAGNOSIS — I129 Hypertensive chronic kidney disease with stage 1 through stage 4 chronic kidney disease, or unspecified chronic kidney disease: Secondary | ICD-10-CM | POA: Diagnosis not present

## 2020-12-06 DIAGNOSIS — Z794 Long term (current) use of insulin: Secondary | ICD-10-CM | POA: Diagnosis not present

## 2020-12-06 DIAGNOSIS — E1122 Type 2 diabetes mellitus with diabetic chronic kidney disease: Secondary | ICD-10-CM | POA: Diagnosis not present

## 2020-12-06 DIAGNOSIS — D631 Anemia in chronic kidney disease: Secondary | ICD-10-CM | POA: Diagnosis not present

## 2020-12-06 DIAGNOSIS — N189 Chronic kidney disease, unspecified: Secondary | ICD-10-CM | POA: Diagnosis not present

## 2020-12-06 DIAGNOSIS — Z7982 Long term (current) use of aspirin: Secondary | ICD-10-CM | POA: Diagnosis not present

## 2020-12-06 DIAGNOSIS — E1142 Type 2 diabetes mellitus with diabetic polyneuropathy: Secondary | ICD-10-CM | POA: Diagnosis not present

## 2020-12-06 DIAGNOSIS — C61 Malignant neoplasm of prostate: Secondary | ICD-10-CM | POA: Diagnosis not present

## 2020-12-06 DIAGNOSIS — Z79899 Other long term (current) drug therapy: Secondary | ICD-10-CM | POA: Diagnosis not present

## 2020-12-07 ENCOUNTER — Ambulatory Visit
Admit: 2020-12-07 | Discharge: 2020-12-08 | Payer: PRIVATE HEALTH INSURANCE | Attending: Student in an Organized Health Care Education/Training Program | Primary: Student in an Organized Health Care Education/Training Program

## 2020-12-07 DIAGNOSIS — I10 Essential (primary) hypertension: Secondary | ICD-10-CM | POA: Diagnosis not present

## 2020-12-07 DIAGNOSIS — E119 Type 2 diabetes mellitus without complications: Secondary | ICD-10-CM | POA: Diagnosis not present

## 2020-12-07 DIAGNOSIS — Z794 Long term (current) use of insulin: Secondary | ICD-10-CM | POA: Diagnosis not present

## 2020-12-07 DIAGNOSIS — R600 Localized edema: Secondary | ICD-10-CM | POA: Diagnosis not present

## 2020-12-07 MED ORDER — AMLODIPINE 5 MG TABLET
ORAL_TABLET | Freq: Every day | ORAL | 11 refills | 30 days | Status: CP
Start: 2020-12-07 — End: 2021-12-07

## 2020-12-07 MED ORDER — VALSARTAN 320 MG TABLET
ORAL_TABLET | Freq: Every day | ORAL | 3 refills | 90 days | Status: CP
Start: 2020-12-07 — End: 2021-12-07

## 2020-12-14 DIAGNOSIS — E119 Type 2 diabetes mellitus without complications: Secondary | ICD-10-CM | POA: Diagnosis not present

## 2021-02-21 ENCOUNTER — Ambulatory Visit
Admit: 2021-02-21 | Discharge: 2021-02-22 | Payer: PRIVATE HEALTH INSURANCE | Attending: Student in an Organized Health Care Education/Training Program | Primary: Student in an Organized Health Care Education/Training Program

## 2021-02-21 DIAGNOSIS — I10 Essential (primary) hypertension: Secondary | ICD-10-CM | POA: Diagnosis not present

## 2021-02-21 DIAGNOSIS — Z794 Long term (current) use of insulin: Secondary | ICD-10-CM | POA: Diagnosis not present

## 2021-02-21 DIAGNOSIS — E119 Type 2 diabetes mellitus without complications: Secondary | ICD-10-CM | POA: Diagnosis not present

## 2021-03-04 ENCOUNTER — Ambulatory Visit: Admit: 2021-03-04 | Discharge: 2021-03-05 | Payer: PRIVATE HEALTH INSURANCE

## 2021-03-04 DIAGNOSIS — F3289 Other specified depressive episodes: Secondary | ICD-10-CM | POA: Diagnosis not present

## 2021-03-04 DIAGNOSIS — N183 CKD stage 3 due to type 2 diabetes mellitus (CMS-HCC): Principal | ICD-10-CM

## 2021-03-04 DIAGNOSIS — I10 Essential (primary) hypertension: Secondary | ICD-10-CM | POA: Diagnosis not present

## 2021-03-04 DIAGNOSIS — E119 Type 2 diabetes mellitus without complications: Secondary | ICD-10-CM | POA: Diagnosis not present

## 2021-03-04 DIAGNOSIS — Z794 Long term (current) use of insulin: Secondary | ICD-10-CM | POA: Diagnosis not present

## 2021-03-04 DIAGNOSIS — E1122 Type 2 diabetes mellitus with diabetic chronic kidney disease: Secondary | ICD-10-CM | POA: Diagnosis not present

## 2021-03-07 ENCOUNTER — Other Ambulatory Visit: Admit: 2021-03-07 | Discharge: 2021-03-07 | Payer: PRIVATE HEALTH INSURANCE

## 2021-03-07 ENCOUNTER — Ambulatory Visit
Admit: 2021-03-07 | Discharge: 2021-03-07 | Payer: PRIVATE HEALTH INSURANCE | Attending: Nurse Practitioner | Primary: Nurse Practitioner

## 2021-03-07 DIAGNOSIS — C61 Malignant neoplasm of prostate: Secondary | ICD-10-CM | POA: Diagnosis not present

## 2021-03-07 DIAGNOSIS — M549 Dorsalgia, unspecified: Secondary | ICD-10-CM | POA: Diagnosis not present

## 2021-03-07 DIAGNOSIS — Z5111 Encounter for antineoplastic chemotherapy: Secondary | ICD-10-CM | POA: Diagnosis not present

## 2021-03-07 DIAGNOSIS — N189 Chronic kidney disease, unspecified: Secondary | ICD-10-CM | POA: Diagnosis not present

## 2021-03-07 DIAGNOSIS — E1142 Type 2 diabetes mellitus with diabetic polyneuropathy: Secondary | ICD-10-CM | POA: Diagnosis not present

## 2021-03-07 DIAGNOSIS — I129 Hypertensive chronic kidney disease with stage 1 through stage 4 chronic kidney disease, or unspecified chronic kidney disease: Secondary | ICD-10-CM | POA: Diagnosis not present

## 2021-03-07 DIAGNOSIS — E1122 Type 2 diabetes mellitus with diabetic chronic kidney disease: Secondary | ICD-10-CM | POA: Diagnosis not present

## 2021-03-19 ENCOUNTER — Ambulatory Visit: Admit: 2021-03-19 | Discharge: 2021-03-20 | Payer: PRIVATE HEALTH INSURANCE

## 2021-03-19 DIAGNOSIS — I10 Essential (primary) hypertension: Secondary | ICD-10-CM | POA: Diagnosis not present

## 2021-03-19 DIAGNOSIS — E118 Type 2 diabetes mellitus with unspecified complications: Secondary | ICD-10-CM | POA: Diagnosis not present

## 2021-03-19 DIAGNOSIS — D631 Anemia in chronic kidney disease: Secondary | ICD-10-CM | POA: Diagnosis not present

## 2021-03-19 DIAGNOSIS — N1832 Anemia in stage 3b chronic kidney disease (CMS-HCC): Principal | ICD-10-CM

## 2021-03-22 MED ORDER — LEVOTHYROXINE 25 MCG TABLET
ORAL_TABLET | Freq: Every day | ORAL | 3 refills | 60 days
Start: 2021-03-22 — End: ?

## 2021-03-24 MED ORDER — LEVOTHYROXINE 25 MCG TABLET
ORAL_TABLET | Freq: Every day | ORAL | 3 refills | 60 days | Status: CP
Start: 2021-03-24 — End: ?

## 2021-05-18 MED ORDER — CARVEDILOL 25 MG TABLET
ORAL_TABLET | 11 refills | 0.00000 days
Start: 2021-05-18 — End: ?

## 2021-05-19 MED ORDER — CARVEDILOL 25 MG TABLET
ORAL_TABLET | ORAL | 8 refills | 0.00000 days | Status: CP
Start: 2021-05-19 — End: ?

## 2021-05-21 MED ORDER — ATORVASTATIN 40 MG TABLET
ORAL_TABLET | Freq: Every day | ORAL | 3 refills | 90.00000 days | Status: CP
Start: 2021-05-21 — End: 2022-03-09

## 2021-05-22 MED ORDER — HYDRALAZINE 100 MG TABLET
ORAL_TABLET | Freq: Three times a day (TID) | ORAL | 11 refills | 40.00000 days
Start: 2021-05-22 — End: 2022-05-22

## 2021-06-05 DIAGNOSIS — C61 Malignant neoplasm of prostate: Principal | ICD-10-CM

## 2021-06-06 ENCOUNTER — Ambulatory Visit
Admit: 2021-06-06 | Discharge: 2021-06-07 | Payer: PRIVATE HEALTH INSURANCE | Attending: Nurse Practitioner | Primary: Nurse Practitioner

## 2021-06-06 ENCOUNTER — Other Ambulatory Visit: Admit: 2021-06-06 | Discharge: 2021-06-07 | Payer: PRIVATE HEALTH INSURANCE

## 2021-06-06 DIAGNOSIS — E1122 Type 2 diabetes mellitus with diabetic chronic kidney disease: Secondary | ICD-10-CM | POA: Diagnosis not present

## 2021-06-06 DIAGNOSIS — N189 Chronic kidney disease, unspecified: Secondary | ICD-10-CM | POA: Diagnosis not present

## 2021-06-06 DIAGNOSIS — C61 Malignant neoplasm of prostate: Secondary | ICD-10-CM | POA: Diagnosis not present

## 2021-06-06 DIAGNOSIS — E1142 Type 2 diabetes mellitus with diabetic polyneuropathy: Secondary | ICD-10-CM | POA: Diagnosis not present

## 2021-06-06 DIAGNOSIS — I129 Hypertensive chronic kidney disease with stage 1 through stage 4 chronic kidney disease, or unspecified chronic kidney disease: Secondary | ICD-10-CM | POA: Diagnosis not present

## 2021-06-06 DIAGNOSIS — C7801 Secondary malignant neoplasm of right lung: Secondary | ICD-10-CM | POA: Diagnosis not present

## 2021-06-06 DIAGNOSIS — C7802 Secondary malignant neoplasm of left lung: Secondary | ICD-10-CM | POA: Diagnosis not present

## 2021-08-16 MED ORDER — AMITRIPTYLINE 25 MG TABLET
ORAL_TABLET | 3 refills | 0 days | Status: CP
Start: 2021-08-16 — End: ?

## 2021-08-16 MED ORDER — LEVOTHYROXINE 25 MCG TABLET
ORAL_TABLET | 3 refills | 0.00000 days | Status: CP
Start: 2021-08-16 — End: ?

## 2021-09-05 ENCOUNTER — Institutional Professional Consult (permissible substitution): Admit: 2021-09-05 | Discharge: 2021-09-05 | Payer: PRIVATE HEALTH INSURANCE

## 2021-09-05 ENCOUNTER — Ambulatory Visit
Admit: 2021-09-05 | Discharge: 2021-09-05 | Payer: PRIVATE HEALTH INSURANCE | Attending: Nurse Practitioner | Primary: Nurse Practitioner

## 2021-09-05 ENCOUNTER — Other Ambulatory Visit: Admit: 2021-09-05 | Discharge: 2021-09-05 | Payer: PRIVATE HEALTH INSURANCE

## 2021-09-05 DIAGNOSIS — Z23 Encounter for immunization: Secondary | ICD-10-CM | POA: Diagnosis not present

## 2021-09-05 DIAGNOSIS — N189 Chronic kidney disease, unspecified: Secondary | ICD-10-CM | POA: Diagnosis not present

## 2021-09-05 DIAGNOSIS — Z5111 Encounter for antineoplastic chemotherapy: Secondary | ICD-10-CM | POA: Diagnosis not present

## 2021-09-05 DIAGNOSIS — E1122 Type 2 diabetes mellitus with diabetic chronic kidney disease: Secondary | ICD-10-CM | POA: Diagnosis not present

## 2021-09-05 DIAGNOSIS — Z7982 Long term (current) use of aspirin: Secondary | ICD-10-CM | POA: Diagnosis not present

## 2021-09-05 DIAGNOSIS — C61 Malignant neoplasm of prostate: Secondary | ICD-10-CM | POA: Diagnosis not present

## 2021-09-05 DIAGNOSIS — E291 Testicular hypofunction: Secondary | ICD-10-CM | POA: Diagnosis not present

## 2021-09-05 DIAGNOSIS — Z79890 Hormone replacement therapy (postmenopausal): Secondary | ICD-10-CM | POA: Diagnosis not present

## 2021-09-05 DIAGNOSIS — Z79818 Long term (current) use of other agents affecting estrogen receptors and estrogen levels: Secondary | ICD-10-CM | POA: Diagnosis not present

## 2021-09-05 DIAGNOSIS — E1142 Type 2 diabetes mellitus with diabetic polyneuropathy: Secondary | ICD-10-CM | POA: Diagnosis not present

## 2021-09-05 DIAGNOSIS — R911 Solitary pulmonary nodule: Secondary | ICD-10-CM | POA: Diagnosis not present

## 2021-09-05 DIAGNOSIS — I129 Hypertensive chronic kidney disease with stage 1 through stage 4 chronic kidney disease, or unspecified chronic kidney disease: Secondary | ICD-10-CM | POA: Diagnosis not present

## 2021-09-05 DIAGNOSIS — Z9079 Acquired absence of other genital organ(s): Secondary | ICD-10-CM | POA: Diagnosis not present

## 2021-09-05 DIAGNOSIS — Z794 Long term (current) use of insulin: Secondary | ICD-10-CM | POA: Diagnosis not present

## 2021-09-05 DIAGNOSIS — F329 Major depressive disorder, single episode, unspecified: Secondary | ICD-10-CM | POA: Diagnosis not present

## 2021-09-05 DIAGNOSIS — D631 Anemia in chronic kidney disease: Secondary | ICD-10-CM | POA: Diagnosis not present

## 2021-09-05 DIAGNOSIS — Z79899 Other long term (current) drug therapy: Secondary | ICD-10-CM | POA: Diagnosis not present

## 2021-09-26 MED ORDER — AMLODIPINE 5 MG TABLET
ORAL_TABLET | Freq: Every day | ORAL | 11 refills | 30 days
Start: 2021-09-26 — End: 2022-09-26

## 2021-09-26 MED ORDER — LEVOTHYROXINE 25 MCG TABLET
ORAL_TABLET | 3 refills | 0 days
Start: 2021-09-26 — End: ?

## 2021-10-01 MED ORDER — AMLODIPINE 5 MG TABLET
ORAL_TABLET | Freq: Every day | ORAL | 11 refills | 30 days
Start: 2021-10-01 — End: 2022-10-01

## 2021-10-01 MED ORDER — LEVOTHYROXINE 25 MCG TABLET
ORAL_TABLET | 3 refills | 0.00000 days
Start: 2021-10-01 — End: ?

## 2021-11-09 DIAGNOSIS — E1122 Type 2 diabetes mellitus with diabetic chronic kidney disease: Principal | ICD-10-CM

## 2021-11-09 DIAGNOSIS — N183 CKD stage 3 due to type 2 diabetes mellitus (CMS-HCC): Principal | ICD-10-CM

## 2021-11-09 MED ORDER — ALLOPURINOL 100 MG TABLET
ORAL_TABLET | 3 refills | 0.00000 days
Start: 2021-11-09 — End: ?

## 2021-11-09 MED ORDER — HYDRALAZINE 100 MG TABLET
ORAL_TABLET | 11 refills | 0 days
Start: 2021-11-09 — End: ?

## 2021-11-09 MED ORDER — VALSARTAN 320 MG TABLET
ORAL_TABLET | 3 refills | 0.00000 days
Start: 2021-11-09 — End: ?

## 2021-11-09 MED ORDER — VENLAFAXINE ER 75 MG CAPSULE,EXTENDED RELEASE 24 HR
ORAL_CAPSULE | 3 refills | 0 days
Start: 2021-11-09 — End: ?

## 2021-11-12 MED ORDER — VENLAFAXINE ER 75 MG CAPSULE,EXTENDED RELEASE 24 HR
ORAL_CAPSULE | 0 refills | 0 days | Status: CP
Start: 2021-11-12 — End: ?

## 2021-11-12 MED ORDER — ALLOPURINOL 100 MG TABLET
ORAL_TABLET | ORAL | 3 refills | 0.00000 days | Status: CP
Start: 2021-11-12 — End: ?

## 2021-11-13 ENCOUNTER — Ambulatory Visit: Admit: 2021-11-13 | Discharge: 2021-11-14 | Payer: PRIVATE HEALTH INSURANCE

## 2021-11-20 MED ORDER — VALSARTAN 320 MG TABLET
ORAL_TABLET | 3 refills | 0 days | Status: CP
Start: 2021-11-20 — End: ?

## 2021-11-20 MED ORDER — HYDRALAZINE 100 MG TABLET
ORAL_TABLET | ORAL | 11 refills | 0.00000 days | Status: CP
Start: 2021-11-20 — End: ?

## 2021-12-05 ENCOUNTER — Ambulatory Visit
Admit: 2021-12-05 | Discharge: 2021-12-06 | Payer: PRIVATE HEALTH INSURANCE | Attending: Nurse Practitioner | Primary: Nurse Practitioner

## 2021-12-05 ENCOUNTER — Other Ambulatory Visit: Admit: 2021-12-05 | Discharge: 2021-12-06 | Payer: PRIVATE HEALTH INSURANCE

## 2021-12-05 DIAGNOSIS — C7801 Secondary malignant neoplasm of right lung: Principal | ICD-10-CM

## 2021-12-05 DIAGNOSIS — C61 Malignant neoplasm of prostate: Principal | ICD-10-CM

## 2021-12-05 DIAGNOSIS — C7802 Secondary malignant neoplasm of left lung: Principal | ICD-10-CM

## 2021-12-13 MED ORDER — AMLODIPINE 5 MG TABLET
ORAL_TABLET | ORAL | 11 refills | 0.00000 days | Status: CP
Start: 2021-12-13 — End: ?

## 2022-02-11 DIAGNOSIS — E1122 Type 2 diabetes mellitus with diabetic chronic kidney disease: Principal | ICD-10-CM

## 2022-02-11 DIAGNOSIS — N183 CKD stage 3 due to type 2 diabetes mellitus (CMS-HCC): Principal | ICD-10-CM

## 2022-02-11 MED ORDER — VENLAFAXINE ER 75 MG CAPSULE,EXTENDED RELEASE 24 HR
ORAL_CAPSULE | 0 refills | 0 days
Start: 2022-02-11 — End: ?

## 2022-02-11 MED ORDER — ALLOPURINOL 100 MG TABLET
ORAL_TABLET | 0 refills | 0 days
Start: 2022-02-11 — End: ?

## 2022-02-12 MED ORDER — VENLAFAXINE ER 75 MG CAPSULE,EXTENDED RELEASE 24 HR
ORAL_CAPSULE | 0 refills | 0 days | Status: CP
Start: 2022-02-12 — End: ?

## 2022-02-12 MED ORDER — ALLOPURINOL 100 MG TABLET
ORAL_TABLET | 0 refills | 0 days | Status: CP
Start: 2022-02-12 — End: ?

## 2022-02-19 ENCOUNTER — Ambulatory Visit
Admit: 2022-02-19 | Discharge: 2022-02-20 | Payer: PRIVATE HEALTH INSURANCE | Attending: Student in an Organized Health Care Education/Training Program | Primary: Student in an Organized Health Care Education/Training Program

## 2022-02-19 DIAGNOSIS — E039 Hypothyroidism, unspecified: Principal | ICD-10-CM

## 2022-02-19 DIAGNOSIS — E1122 Type 2 diabetes mellitus with diabetic chronic kidney disease: Principal | ICD-10-CM

## 2022-02-19 DIAGNOSIS — D649 Anemia, unspecified: Principal | ICD-10-CM

## 2022-02-19 DIAGNOSIS — E7800 Pure hypercholesterolemia, unspecified: Principal | ICD-10-CM

## 2022-02-19 DIAGNOSIS — Z794 Long term (current) use of insulin: Principal | ICD-10-CM

## 2022-02-19 DIAGNOSIS — I10 Essential (primary) hypertension: Principal | ICD-10-CM

## 2022-02-19 DIAGNOSIS — G629 Polyneuropathy, unspecified: Principal | ICD-10-CM

## 2022-02-19 DIAGNOSIS — M1A09X0 Idiopathic chronic gout, multiple sites, without tophus (tophi): Principal | ICD-10-CM

## 2022-02-19 DIAGNOSIS — E119 Type 2 diabetes mellitus without complications: Principal | ICD-10-CM

## 2022-02-19 DIAGNOSIS — N183 CKD stage 3 due to type 2 diabetes mellitus (CMS-HCC): Principal | ICD-10-CM

## 2022-02-19 MED ORDER — VENLAFAXINE ER 150 MG CAPSULE,EXTENDED RELEASE 24 HR
ORAL_CAPSULE | Freq: Every day | ORAL | 2 refills | 90 days | Status: CP
Start: 2022-02-19 — End: 2022-11-16

## 2022-02-19 MED ORDER — EMPAGLIFLOZIN 10 MG TABLET
ORAL_TABLET | Freq: Every day | ORAL | 3 refills | 90 days | Status: CP
Start: 2022-02-19 — End: ?

## 2022-02-19 MED ORDER — ALLOPURINOL 100 MG TABLET
ORAL_TABLET | Freq: Every day | ORAL | 3 refills | 180 days | Status: CP
Start: 2022-02-19 — End: ?

## 2022-02-20 DIAGNOSIS — E1122 Type 2 diabetes mellitus with diabetic chronic kidney disease: Principal | ICD-10-CM

## 2022-02-20 DIAGNOSIS — N183 CKD stage 3 due to type 2 diabetes mellitus (CMS-HCC): Principal | ICD-10-CM

## 2022-02-20 DIAGNOSIS — E039 Hypothyroidism, unspecified: Principal | ICD-10-CM

## 2022-02-20 DIAGNOSIS — D509 Iron deficiency anemia, unspecified: Principal | ICD-10-CM

## 2022-02-20 DIAGNOSIS — D649 Anemia, unspecified: Principal | ICD-10-CM

## 2022-02-20 MED ORDER — ALLOPURINOL 100 MG TABLET
ORAL_TABLET | Freq: Every day | ORAL | 3 refills | 90 days | Status: CP
Start: 2022-02-20 — End: ?

## 2022-02-20 MED ORDER — FERROUS SULFATE 325 MG (65 MG IRON) TABLET
ORAL_TABLET | ORAL | 3 refills | 360.00000 days | Status: CP
Start: 2022-02-20 — End: 2026-01-30

## 2022-02-20 MED ORDER — LEVOTHYROXINE 50 MCG TABLET
ORAL_TABLET | Freq: Every day | ORAL | 3 refills | 90.00000 days | Status: CP
Start: 2022-02-20 — End: 2022-02-20

## 2022-03-03 DIAGNOSIS — C7802 Secondary malignant neoplasm of left lung: Principal | ICD-10-CM

## 2022-03-03 DIAGNOSIS — C61 Malignant neoplasm of prostate: Principal | ICD-10-CM

## 2022-03-03 DIAGNOSIS — C7801 Secondary malignant neoplasm of right lung: Principal | ICD-10-CM

## 2022-03-04 ENCOUNTER — Ambulatory Visit: Admit: 2022-03-04 | Discharge: 2022-03-05 | Payer: PRIVATE HEALTH INSURANCE

## 2022-03-24 ENCOUNTER — Ambulatory Visit
Admit: 2022-03-24 | Discharge: 2022-03-25 | Payer: PRIVATE HEALTH INSURANCE | Attending: Nurse Practitioner | Primary: Nurse Practitioner

## 2022-03-24 ENCOUNTER — Other Ambulatory Visit: Admit: 2022-03-24 | Discharge: 2022-03-25 | Payer: PRIVATE HEALTH INSURANCE

## 2022-03-24 DIAGNOSIS — N2889 Other specified disorders of kidney and ureter: Principal | ICD-10-CM

## 2022-03-24 DIAGNOSIS — C61 Malignant neoplasm of prostate: Principal | ICD-10-CM

## 2022-04-09 ENCOUNTER — Ambulatory Visit
Admit: 2022-04-09 | Payer: PRIVATE HEALTH INSURANCE | Attending: Student in an Organized Health Care Education/Training Program | Primary: Student in an Organized Health Care Education/Training Program

## 2022-05-12 MED ORDER — VENLAFAXINE ER 75 MG CAPSULE,EXTENDED RELEASE 24 HR
ORAL_CAPSULE | 0 refills | 0 days
Start: 2022-05-12 — End: ?

## 2022-05-14 MED ORDER — VENLAFAXINE ER 75 MG CAPSULE,EXTENDED RELEASE 24 HR
ORAL_CAPSULE | 0 refills | 0 days | Status: CP
Start: 2022-05-14 — End: ?

## 2022-06-11 MED ORDER — AMLODIPINE 5 MG TABLET
ORAL_TABLET | Freq: Every day | ORAL | 2 refills | 90 days | Status: CP
Start: 2022-06-11 — End: 2023-02-20

## 2022-06-23 ENCOUNTER — Ambulatory Visit: Admit: 2022-06-23 | Discharge: 2022-06-23 | Payer: PRIVATE HEALTH INSURANCE

## 2022-06-30 ENCOUNTER — Ambulatory Visit
Admit: 2022-06-30 | Discharge: 2022-07-01 | Payer: PRIVATE HEALTH INSURANCE | Attending: Nurse Practitioner | Primary: Nurse Practitioner

## 2022-06-30 DIAGNOSIS — C61 Malignant neoplasm of prostate: Principal | ICD-10-CM

## 2022-06-30 DIAGNOSIS — N2889 Other specified disorders of kidney and ureter: Principal | ICD-10-CM

## 2022-07-11 MED ORDER — ATORVASTATIN 40 MG TABLET
ORAL_TABLET | Freq: Every day | ORAL | 3 refills | 90 days | Status: CP
Start: 2022-07-11 — End: 2023-02-20

## 2022-07-15 ENCOUNTER — Ambulatory Visit: Admit: 2022-07-15 | Discharge: 2022-07-15 | Payer: PRIVATE HEALTH INSURANCE

## 2022-07-15 DIAGNOSIS — N2889 Other specified disorders of kidney and ureter: Principal | ICD-10-CM

## 2022-08-21 ENCOUNTER — Ambulatory Visit: Admit: 2022-08-21 | Discharge: 2022-08-22 | Payer: PRIVATE HEALTH INSURANCE

## 2022-08-21 DIAGNOSIS — N2889 Other specified disorders of kidney and ureter: Principal | ICD-10-CM

## 2022-09-29 ENCOUNTER — Ambulatory Visit
Admit: 2022-09-29 | Discharge: 2022-09-29 | Payer: PRIVATE HEALTH INSURANCE | Attending: Nurse Practitioner | Primary: Nurse Practitioner

## 2022-09-29 ENCOUNTER — Institutional Professional Consult (permissible substitution): Admit: 2022-09-29 | Discharge: 2022-09-29 | Payer: PRIVATE HEALTH INSURANCE

## 2022-09-29 ENCOUNTER — Other Ambulatory Visit: Admit: 2022-09-29 | Discharge: 2022-09-29 | Payer: PRIVATE HEALTH INSURANCE

## 2022-09-29 DIAGNOSIS — C61 Malignant neoplasm of prostate: Principal | ICD-10-CM

## 2022-12-08 MED ORDER — AMITRIPTYLINE 25 MG TABLET
ORAL_TABLET | 0 refills | 0 days
Start: 2022-12-08 — End: ?

## 2022-12-08 MED ORDER — HYDRALAZINE 100 MG TABLET
ORAL_TABLET | 11 refills | 0 days
Start: 2022-12-08 — End: ?

## 2022-12-12 MED ORDER — AMITRIPTYLINE 25 MG TABLET
ORAL_TABLET | 0 refills | 0 days | Status: CP
Start: 2022-12-12 — End: ?

## 2022-12-12 MED ORDER — HYDRALAZINE 100 MG TABLET
ORAL_TABLET | 11 refills | 0 days | Status: CP
Start: 2022-12-12 — End: ?

## 2022-12-23 ENCOUNTER — Ambulatory Visit: Admit: 2022-12-23 | Discharge: 2022-12-24 | Payer: MEDICARE

## 2023-01-01 ENCOUNTER — Institutional Professional Consult (permissible substitution): Admit: 2023-01-01 | Discharge: 2023-01-01 | Payer: MEDICARE

## 2023-01-01 ENCOUNTER — Other Ambulatory Visit: Admit: 2023-01-01 | Discharge: 2023-01-01 | Payer: MEDICARE

## 2023-01-01 ENCOUNTER — Ambulatory Visit
Admit: 2023-01-01 | Discharge: 2023-01-01 | Payer: MEDICARE | Attending: Nurse Practitioner | Primary: Nurse Practitioner

## 2023-01-01 DIAGNOSIS — C61 Malignant neoplasm of prostate: Principal | ICD-10-CM

## 2023-01-01 DIAGNOSIS — E039 Hypothyroidism, unspecified: Principal | ICD-10-CM

## 2023-01-01 DIAGNOSIS — E119 Type 2 diabetes mellitus without complications: Principal | ICD-10-CM

## 2023-01-02 ENCOUNTER — Ambulatory Visit: Admit: 2023-01-02 | Discharge: 2023-01-03 | Payer: MEDICARE

## 2023-01-02 DIAGNOSIS — D649 Anemia, unspecified: Principal | ICD-10-CM

## 2023-01-02 DIAGNOSIS — E1122 Type 2 diabetes mellitus with diabetic chronic kidney disease: Principal | ICD-10-CM

## 2023-01-02 DIAGNOSIS — D509 Iron deficiency anemia, unspecified: Principal | ICD-10-CM

## 2023-01-02 DIAGNOSIS — I10 Essential (primary) hypertension: Principal | ICD-10-CM

## 2023-01-02 DIAGNOSIS — E039 Hypothyroidism, unspecified: Principal | ICD-10-CM

## 2023-01-02 DIAGNOSIS — E119 Type 2 diabetes mellitus without complications: Principal | ICD-10-CM

## 2023-01-02 DIAGNOSIS — N183 CKD stage 3 due to type 2 diabetes mellitus (CMS-HCC): Principal | ICD-10-CM

## 2023-01-02 DIAGNOSIS — G629 Polyneuropathy, unspecified: Principal | ICD-10-CM

## 2023-01-02 MED ORDER — VENLAFAXINE ER 150 MG CAPSULE,EXTENDED RELEASE 24 HR
ORAL_CAPSULE | Freq: Every day | ORAL | 2 refills | 90 days | Status: CP
Start: 2023-01-02 — End: 2023-09-29

## 2023-01-02 MED ORDER — AMLODIPINE 5 MG TABLET
ORAL_TABLET | Freq: Every day | ORAL | 2 refills | 90 days | Status: CP
Start: 2023-01-02 — End: 2023-09-13

## 2023-01-02 MED ORDER — ATORVASTATIN 40 MG TABLET
ORAL_TABLET | Freq: Every day | ORAL | 3 refills | 90 days | Status: CP
Start: 2023-01-02 — End: 2023-08-14

## 2023-01-02 MED ORDER — AMITRIPTYLINE 25 MG TABLET
ORAL_TABLET | Freq: Every evening | ORAL | 0 refills | 90 days | Status: CP
Start: 2023-01-02 — End: ?

## 2023-01-02 MED ORDER — FERROUS SULFATE 325 MG (65 MG IRON) TABLET
ORAL_TABLET | ORAL | 3 refills | 360 days | Status: CP
Start: 2023-01-02 — End: 2026-12-12

## 2023-01-02 MED ORDER — LEVOTHYROXINE 25 MCG TABLET
ORAL_TABLET | Freq: Every day | ORAL | 0 refills | 45 days | Status: CP
Start: 2023-01-02 — End: ?

## 2023-01-02 MED ORDER — VALSARTAN 320 MG TABLET
ORAL_TABLET | Freq: Every day | ORAL | 3 refills | 90 days | Status: CP
Start: 2023-01-02 — End: ?

## 2023-01-02 MED ORDER — HYDRALAZINE 100 MG TABLET
ORAL_TABLET | Freq: Three times a day (TID) | ORAL | 11 refills | 40 days | Status: CN
Start: 2023-01-02 — End: ?

## 2023-01-02 MED ORDER — EMPAGLIFLOZIN 10 MG TABLET
ORAL_TABLET | Freq: Every day | ORAL | 3 refills | 90 days | Status: CP
Start: 2023-01-02 — End: ?

## 2023-01-02 MED ORDER — OZEMPIC 0.25 MG OR 0.5 MG (2 MG/3 ML) SUBCUTANEOUS PEN INJECTOR
SUBCUTANEOUS | 0 refills | 56 days | Status: CP
Start: 2023-01-02 — End: 2023-02-27

## 2023-01-15 ENCOUNTER — Other Ambulatory Visit: Payer: Self-pay

## 2023-01-15 ENCOUNTER — Emergency Department
Admission: EM | Admit: 2023-01-15 | Discharge: 2023-01-15 | Disposition: A | Discharge status: Home / Self Care | Payer: Commercial Managed Care - HMO | Attending: Emergency Medicine | Admitting: Emergency Medicine

## 2023-01-15 ENCOUNTER — Emergency Department: Payer: Commercial Managed Care - HMO

## 2023-01-15 DIAGNOSIS — W19XXXA Unspecified fall, initial encounter: Secondary | ICD-10-CM | POA: Insufficient documentation

## 2023-01-15 DIAGNOSIS — E1122 Type 2 diabetes mellitus with diabetic chronic kidney disease: Secondary | ICD-10-CM | POA: Diagnosis not present

## 2023-01-15 DIAGNOSIS — S0003XA Contusion of scalp, initial encounter: Secondary | ICD-10-CM | POA: Diagnosis not present

## 2023-01-15 DIAGNOSIS — N189 Chronic kidney disease, unspecified: Secondary | ICD-10-CM | POA: Diagnosis not present

## 2023-01-15 DIAGNOSIS — T383X5A Adverse effect of insulin and oral hypoglycemic [antidiabetic] drugs, initial encounter: Secondary | ICD-10-CM | POA: Diagnosis not present

## 2023-01-15 DIAGNOSIS — S0990XA Unspecified injury of head, initial encounter: Secondary | ICD-10-CM | POA: Diagnosis present

## 2023-01-15 DIAGNOSIS — I129 Hypertensive chronic kidney disease with stage 1 through stage 4 chronic kidney disease, or unspecified chronic kidney disease: Secondary | ICD-10-CM | POA: Diagnosis not present

## 2023-01-15 DIAGNOSIS — R112 Nausea with vomiting, unspecified: Secondary | ICD-10-CM | POA: Diagnosis not present

## 2023-01-15 DIAGNOSIS — T50905A Adverse effect of unspecified drugs, medicaments and biological substances, initial encounter: Secondary | ICD-10-CM

## 2023-01-15 DIAGNOSIS — Y92019 Unspecified place in single-family (private) house as the place of occurrence of the external cause: Secondary | ICD-10-CM | POA: Insufficient documentation

## 2023-01-15 LAB — URINALYSIS, ROUTINE W REFLEX MICROSCOPIC
Bilirubin Urine: NEGATIVE
Glucose, UA: >=500 mg/dL — AB
Hgb urine dipstick: NEGATIVE
Ketones, ur: NEGATIVE mg/dL
Leukocytes,Ua: NEGATIVE
Nitrite: NEGATIVE
Protein, ur: NEGATIVE mg/dL
Specific Gravity, Urine: 1.022 (ref 1.005–1.030)
pH: 6 (ref 5.0–8.0)

## 2023-01-15 LAB — COMPREHENSIVE METABOLIC PANEL
ALT: 21 U/L (ref 0–44)
AST: 28 U/L (ref 15–41)
Albumin: 4.2 g/dL (ref 3.5–5.0)
Alkaline Phosphatase: 92 U/L (ref 38–126)
Anion gap: 11 (ref 5–15)
BUN: 40 mg/dL — ABNORMAL HIGH (ref 8–23)
CO2: 27 mmol/L (ref 22–32)
Calcium: 10.6 mg/dL — ABNORMAL HIGH (ref 8.9–10.3)
Chloride: 96 mmol/L — ABNORMAL LOW (ref 98–111)
Creatinine, Ser: 2.54 mg/dL — ABNORMAL HIGH (ref 0.61–1.24)
GFR, Estimated: 24 mL/min — ABNORMAL LOW (ref >60)
Glucose, Bld: 215 mg/dL — ABNORMAL HIGH (ref 70–99)
Potassium: 4 mmol/L (ref 3.5–5.1)
Sodium: 134 mmol/L — ABNORMAL LOW (ref 135–145)
Total Bilirubin: 1 mg/dL (ref 0.3–1.2)
Total Protein: 6.5 g/dL (ref 6.5–8.1)

## 2023-01-15 LAB — CBC
HCT: 37.8 % — ABNORMAL LOW (ref 39.0–52.0)
Hemoglobin: 12.6 g/dL — ABNORMAL LOW (ref 13.0–17.0)
MCH: 31.7 pg (ref 26.0–34.0)
MCHC: 33.3 g/dL (ref 30.0–36.0)
MCV: 95.2 fL (ref 80.0–100.0)
Platelets: 310 10*3/uL (ref 150–400)
RBC: 3.97 MIL/uL — ABNORMAL LOW (ref 4.22–5.81)
RDW: 13.1 % (ref 11.5–15.5)
WBC: 10.3 10*3/uL (ref 4.0–10.5)
nRBC: 0 % (ref 0.0–0.2)

## 2023-01-15 LAB — LIPASE, BLOOD: Lipase: 50 U/L (ref 11–51)

## 2023-01-15 MED ORDER — LACTATED RINGERS IV BOLUS
1000.0000 mL | Freq: Once | INTRAVENOUS | Status: AC
  Administered 2023-01-15: 1000 mL via INTRAVENOUS

## 2023-01-15 NOTE — ED Provider Notes (Signed)
[facility] Provider Note    Event Date/Time   First MD Initiated Contact with Patient 01/15/23 1753     (approximate)   History   Chief Complaint: Drug Overdose   HPI  [name] [name] is a 88 y.o. male with history of hypertension diabetes GERD CKD who is brought to the ED due to accidental overdose of Ozempic.  Patient had been taking his weekly dose of Ozempic on a daily basis on 2/26, 2/27, 2/28, 2/29, 3/5, 3/6.  During this time he has been having loss of appetite, nausea and vomiting.  With poor oral intake he is also been getting dizzy with lightheaded, and has fallen at home recently.  He also had a fall about a month ago where he hit his head on a brick wall and still has swelling of the area.  He was evaluated by EMS at that time but did not come to the hospital.  Does not take blood thinners.  Currently reports feeling okay, eating a cookie.     Physical Exam   Triage Vital Signs: ED Triage Vitals [01/15/23 1221]  Enc Vitals Group     BP 111/63     Pulse Rate 95     Resp 16     Temp 97.6 F (36.4 C)     Temp Source Oral     SpO2 95 %     Weight      Height      Head Circumference      Peak Flow      Pain Score      Pain Loc      Pain Edu?      Excl. in [place]?     Most recent vital signs: Vitals:   01/15/23 1800 01/15/23 1900  BP: (!) 141/75 (!) 164/80  Pulse: 86 78  Resp: 18 16  Temp:  97.7 F (36.5 C)  SpO2: 97% 100%    General: Awake, no distress.  CV:  Good peripheral perfusion.  Regular rate and rhythm Resp:  Normal effort.  Clear to auscultation bilaterally Abd:  No distention.  Soft nontender Other:  Dry mucous membranes.  There is 2 cm area of swelling and bruising over the lateral aspect of the left eyebrow and lateral orbit.  No bony point tenderness.  Extraocular movements are intact.  No nystagmus.  No evidence of globe trauma   ED Results / Procedures / Treatments   Labs (all labs ordered are listed, but  only abnormal results are displayed) Labs Reviewed  COMPREHENSIVE METABOLIC PANEL - Abnormal; Notable for the following components:      Result Value   Sodium 134 (*)    Chloride 96 (*)    Glucose, Bld 215 (*)    BUN 40 (*)    Creatinine, Ser 2.54 (*)    Calcium 10.6 (*)    GFR, Estimated 24 (*)    All other components within normal limits  CBC - Abnormal; Notable for the following components:   RBC 3.97 (*)    Hemoglobin 12.6 (*)    HCT 37.8 (*)    All other components within normal limits  URINALYSIS, ROUTINE W REFLEX MICROSCOPIC - Abnormal; Notable for the following components:   Color, Urine YELLOW (*)    APPearance CLEAR (*)    Glucose, UA >=500 (*)    Bacteria, UA RARE (*)    All other components within normal limits  LIPASE, BLOOD     EKG  RADIOLOGY CT head interpreted by me, negative for hemorrhage.  Radiology report reviewed   PROCEDURES:  Procedures   MEDICATIONS ORDERED IN ED: Medications  lactated ringers bolus 1,000 mL (1,000 mLs Intravenous New Bag/Given 01/15/23 1825)     IMPRESSION / MDM / ASSESSMENT AND PLAN / ED COURSE  I reviewed the triage vital signs and the nursing notes.  DDx: Intracranial hemorrhage, skull fracture, AKI, electrolyte abnormality, dehydration, UTI  Patient's presentation is most consistent with acute presentation with potential threat to life or bodily function.  Patient presents with dizziness and falls related to dehydration which is a secondary effect of GI side effects from overuse of Ozempic which is accidental.  No reason to think that there was any intentional self-harm.  He is currently feeling a bit better, tolerating oral intake.  Will give IV fluids for hydration.  Will obtain CT head to evaluate for intracranial hemorrhage.   ----------------------------------------- 8:47 PM on 01/15/2023 ----------------------------------------- CT head negative.  Patient feeling well and tolerating oral intake.  Will  continue his home meds, hold off on Ozempic for 2 weeks and then restart on a weekly basis.      FINAL CLINICAL IMPRESSION(S) / ED DIAGNOSES   Final diagnoses:  Adverse effect of drug or medicament, initial encounter  Contusion of scalp, initial encounter     Rx / DC Orders   ED Discharge Orders     None        Note:  This document was prepared using Dragon voice recognition software and may include unintentional dictation errors.   Carrie Mew, MD 01/15/23 2047

## 2023-01-15 NOTE — Discharge Instructions (Signed)
Your lab tests and CT scan of the head were all okay today.

## 2023-01-15 NOTE — ED Triage Notes (Signed)
Pt sts that he has been giving himself 0.25 of ozempic daily instead of weekly by mistake. Pt has been having N/V and abd pain. Pt is pale in color and sts that he noticed it yesterday.

## 2023-01-28 ENCOUNTER — Telehealth: Payer: Self-pay | Admitting: *Deleted

## 2023-01-28 NOTE — Telephone Encounter (Signed)
     Patient  visit on 01/15/2023  at [facility] was for Treatment   Have you been able to follow up with your primary care physician? Has seen dr and is ok with transportation and medicine The patient was  able to obtain any needed medicine or equipment.  Are there diet recommendations that you are having difficulty following?  Patient expresses understanding of discharge instructions and education provided has no other needs at this time.  [place] [phone] [address] , [place] [address] Email : [email or web address]

## 2023-02-16 ENCOUNTER — Ambulatory Visit: Admit: 2023-02-16 | Discharge: 2023-02-17 | Payer: MEDICARE

## 2023-02-16 DIAGNOSIS — N2889 Other specified disorders of kidney and ureter: Principal | ICD-10-CM

## 2023-02-16 DIAGNOSIS — R93421 Abnormal radiologic findings on diagnostic imaging of right kidney: Principal | ICD-10-CM

## 2023-02-27 MED ORDER — OZEMPIC 1 MG/DOSE (4 MG/3 ML) SUBCUTANEOUS PEN INJECTOR
SUBCUTANEOUS | 0 refills | 28 days | Status: CP
Start: 2023-02-27 — End: 2023-03-21

## 2023-03-20 DIAGNOSIS — E039 Hypothyroidism, unspecified: Principal | ICD-10-CM

## 2023-03-20 MED ORDER — LEVOTHYROXINE 25 MCG TABLET
ORAL_TABLET | Freq: Every day | ORAL | 3 refills | 90 days | Status: CP
Start: 2023-03-20 — End: 2024-01-15

## 2023-04-02 ENCOUNTER — Ambulatory Visit
Admit: 2023-04-02 | Discharge: 2023-04-02 | Payer: MEDICARE | Attending: Nurse Practitioner | Primary: Nurse Practitioner

## 2023-04-02 ENCOUNTER — Institutional Professional Consult (permissible substitution): Admit: 2023-04-02 | Discharge: 2023-04-02 | Payer: MEDICARE

## 2023-04-02 ENCOUNTER — Other Ambulatory Visit: Admit: 2023-04-02 | Discharge: 2023-04-02 | Payer: MEDICARE

## 2023-04-02 DIAGNOSIS — C61 Malignant neoplasm of prostate: Principal | ICD-10-CM

## 2023-07-02 ENCOUNTER — Ambulatory Visit: Admit: 2023-07-02 | Payer: MEDICARE

## 2023-07-02 ENCOUNTER — Ambulatory Visit: Admit: 2023-07-02 | Payer: MEDICARE | Attending: Nurse Practitioner | Primary: Nurse Practitioner

## 2023-07-02 DIAGNOSIS — E1122 Type 2 diabetes mellitus with diabetic chronic kidney disease: Principal | ICD-10-CM

## 2023-07-02 DIAGNOSIS — E119 Type 2 diabetes mellitus without complications: Principal | ICD-10-CM

## 2023-07-02 DIAGNOSIS — N183 CKD stage 3 due to type 2 diabetes mellitus (CMS-HCC): Principal | ICD-10-CM

## 2023-07-27 ENCOUNTER — Ambulatory Visit
Admit: 2023-07-27 | Discharge: 2023-07-27 | Payer: MEDICARE | Attending: Nurse Practitioner | Primary: Nurse Practitioner

## 2023-07-27 ENCOUNTER — Other Ambulatory Visit: Admit: 2023-07-27 | Discharge: 2023-07-27 | Payer: MEDICARE

## 2023-07-27 ENCOUNTER — Institutional Professional Consult (permissible substitution): Admit: 2023-07-27 | Discharge: 2023-07-27 | Payer: MEDICARE

## 2023-07-27 DIAGNOSIS — C61 Malignant neoplasm of prostate: Principal | ICD-10-CM

## 2023-08-18 ENCOUNTER — Ambulatory Visit: Admit: 2023-08-18 | Discharge: 2023-08-19 | Payer: MEDICARE

## 2023-08-24 ENCOUNTER — Ambulatory Visit: Admit: 2023-08-24 | Discharge: 2023-08-25 | Payer: MEDICARE

## 2023-08-24 DIAGNOSIS — N2889 Other specified disorders of kidney and ureter: Principal | ICD-10-CM

## 2023-09-04 DIAGNOSIS — R9389 Abnormal findings on diagnostic imaging of other specified body structures: Principal | ICD-10-CM

## 2023-09-29 ENCOUNTER — Ambulatory Visit: Admit: 2023-09-29 | Discharge: 2023-09-29 | Payer: PRIVATE HEALTH INSURANCE

## 2023-10-01 DIAGNOSIS — C61 Malignant neoplasm of prostate: Principal | ICD-10-CM

## 2023-10-01 DIAGNOSIS — M899 Disorder of bone, unspecified: Principal | ICD-10-CM

## 2023-10-26 ENCOUNTER — Other Ambulatory Visit: Admit: 2023-10-26 | Discharge: 2023-10-26 | Payer: PRIVATE HEALTH INSURANCE

## 2023-10-26 ENCOUNTER — Institutional Professional Consult (permissible substitution): Admit: 2023-10-26 | Discharge: 2023-10-26 | Payer: PRIVATE HEALTH INSURANCE

## 2023-10-26 ENCOUNTER — Ambulatory Visit
Admit: 2023-10-26 | Discharge: 2023-10-26 | Payer: PRIVATE HEALTH INSURANCE | Attending: Nurse Practitioner | Primary: Nurse Practitioner

## 2023-10-26 DIAGNOSIS — C61 Malignant neoplasm of prostate: Principal | ICD-10-CM

## 2023-10-26 DIAGNOSIS — E119 Type 2 diabetes mellitus without complications: Principal | ICD-10-CM

## 2023-11-24 ENCOUNTER — Inpatient Hospital Stay: Admit: 2023-11-24 | Discharge: 2023-11-25 | Payer: PRIVATE HEALTH INSURANCE

## 2024-01-25 ENCOUNTER — Other Ambulatory Visit: Admit: 2024-01-25 | Discharge: 2024-01-26 | Payer: PRIVATE HEALTH INSURANCE

## 2024-01-25 ENCOUNTER — Ambulatory Visit
Admit: 2024-01-25 | Discharge: 2024-01-26 | Payer: PRIVATE HEALTH INSURANCE | Attending: Nurse Practitioner | Primary: Nurse Practitioner

## 2024-01-25 DIAGNOSIS — C61 Malignant neoplasm of prostate: Principal | ICD-10-CM

## 2024-02-04 DIAGNOSIS — G629 Polyneuropathy, unspecified: Principal | ICD-10-CM

## 2024-02-04 MED ORDER — VENLAFAXINE ER 150 MG CAPSULE,EXTENDED RELEASE 24 HR
ORAL_CAPSULE | Freq: Every day | ORAL | 0 refills | 30 days
Start: 2024-02-04 — End: ?

## 2024-02-05 MED ORDER — VENLAFAXINE ER 150 MG CAPSULE,EXTENDED RELEASE 24 HR
ORAL_CAPSULE | Freq: Every day | ORAL | 0 refills | 30 days | Status: CP
Start: 2024-02-05 — End: ?

## 2024-02-09 DIAGNOSIS — E039 Hypothyroidism, unspecified: Principal | ICD-10-CM

## 2024-02-09 MED ORDER — HYDRALAZINE 100 MG TABLET
ORAL_TABLET | 11 refills | 0 days
Start: 2024-02-09 — End: ?

## 2024-02-09 MED ORDER — LEVOTHYROXINE 25 MCG TABLET
ORAL_TABLET | 3 refills | 0 days
Start: 2024-02-09 — End: ?

## 2024-02-10 MED ORDER — LEVOTHYROXINE 25 MCG TABLET
ORAL_TABLET | 3 refills | 0 days
Start: 2024-02-10 — End: ?

## 2024-02-10 MED ORDER — HYDRALAZINE 100 MG TABLET
ORAL_TABLET | 11 refills | 0 days
Start: 2024-02-10 — End: ?

## 2024-02-25 ENCOUNTER — Inpatient Hospital Stay: Admit: 2024-02-25 | Discharge: 2024-02-25 | Payer: Medicare (Managed Care)

## 2024-03-11 DIAGNOSIS — E119 Type 2 diabetes mellitus without complications: Principal | ICD-10-CM

## 2024-03-11 MED ORDER — ATORVASTATIN 40 MG TABLET
ORAL_TABLET | Freq: Every day | ORAL | 0 refills | 30.00000 days | Status: CP
Start: 2024-03-11 — End: 2024-10-21

## 2024-03-21 ENCOUNTER — Ambulatory Visit: Admit: 2024-03-21 | Discharge: 2024-03-22 | Payer: Medicare (Managed Care)

## 2024-03-21 DIAGNOSIS — G629 Polyneuropathy, unspecified: Principal | ICD-10-CM

## 2024-03-21 DIAGNOSIS — E1122 Type 2 diabetes mellitus with diabetic chronic kidney disease: Principal | ICD-10-CM

## 2024-03-21 DIAGNOSIS — N183 CKD stage 3 due to type 2 diabetes mellitus: Principal | ICD-10-CM

## 2024-03-21 DIAGNOSIS — N2889 Other specified disorders of kidney and ureter: Principal | ICD-10-CM

## 2024-03-21 DIAGNOSIS — E119 Type 2 diabetes mellitus without complications: Principal | ICD-10-CM

## 2024-03-21 MED ORDER — VENLAFAXINE ER 150 MG CAPSULE,EXTENDED RELEASE 24 HR
ORAL_CAPSULE | Freq: Every day | ORAL | 0 refills | 30.00000 days | Status: CP
Start: 2024-03-21 — End: 2024-04-20

## 2024-03-21 MED ORDER — EMPAGLIFLOZIN 10 MG TABLET
ORAL_TABLET | Freq: Every day | ORAL | 0 refills | 30.00000 days | Status: CP
Start: 2024-03-21 — End: 2024-04-20

## 2024-03-29 ENCOUNTER — Ambulatory Visit
Admit: 2024-03-29 | Discharge: 2024-03-30 | Payer: Medicare (Managed Care) | Attending: Vascular & Interventional Radiology | Primary: Vascular & Interventional Radiology

## 2024-03-30 DIAGNOSIS — C649 Malignant neoplasm of unspecified kidney, except renal pelvis: Principal | ICD-10-CM

## 2024-03-30 DIAGNOSIS — I10 Essential (primary) hypertension: Principal | ICD-10-CM

## 2024-03-30 DIAGNOSIS — C641 Malignant neoplasm of right kidney, except renal pelvis: Principal | ICD-10-CM

## 2024-03-31 DIAGNOSIS — C641 Malignant neoplasm of right kidney, except renal pelvis: Principal | ICD-10-CM

## 2024-04-07 ENCOUNTER — Ambulatory Visit: Admit: 2024-04-07 | Discharge: 2024-04-08 | Payer: Medicare (Managed Care)

## 2024-04-07 DIAGNOSIS — R296 Repeated falls: Principal | ICD-10-CM

## 2024-04-07 DIAGNOSIS — N183 CKD stage 3 due to type 2 diabetes mellitus: Principal | ICD-10-CM

## 2024-04-07 DIAGNOSIS — I10 Essential (primary) hypertension: Principal | ICD-10-CM

## 2024-04-07 DIAGNOSIS — E1122 Type 2 diabetes mellitus with diabetic chronic kidney disease: Principal | ICD-10-CM

## 2024-04-07 DIAGNOSIS — E1142 Type 2 diabetes mellitus with diabetic polyneuropathy: Principal | ICD-10-CM

## 2024-04-07 DIAGNOSIS — E119 Type 2 diabetes mellitus without complications: Principal | ICD-10-CM

## 2024-04-07 DIAGNOSIS — E039 Hypothyroidism, unspecified: Principal | ICD-10-CM

## 2024-04-07 DIAGNOSIS — G629 Polyneuropathy, unspecified: Principal | ICD-10-CM

## 2024-04-07 MED ORDER — AMLODIPINE 5 MG TABLET
ORAL_TABLET | Freq: Every day | ORAL | 11 refills | 30.00000 days | Status: CP
Start: 2024-04-07 — End: 2025-04-07

## 2024-04-07 MED ORDER — VALSARTAN 320 MG TABLET
ORAL_TABLET | Freq: Every day | ORAL | 3 refills | 90.00000 days | Status: CP
Start: 2024-04-07 — End: ?

## 2024-04-07 MED ORDER — ATORVASTATIN 40 MG TABLET
ORAL_TABLET | Freq: Every day | ORAL | 3 refills | 90.00000 days | Status: CP
Start: 2024-04-07 — End: 2025-04-02

## 2024-04-07 MED ORDER — EMPAGLIFLOZIN 25 MG TABLET
ORAL_TABLET | Freq: Every day | ORAL | 3 refills | 90.00000 days | Status: CP
Start: 2024-04-07 — End: 2025-04-02

## 2024-04-07 MED ORDER — GLIPIZIDE 5 MG TABLET
ORAL_TABLET | Freq: Two times a day (BID) | ORAL | 3 refills | 45.00000 days | Status: CP
Start: 2024-04-07 — End: 2025-04-02

## 2024-04-07 MED ORDER — VENLAFAXINE ER 150 MG CAPSULE,EXTENDED RELEASE 24 HR
ORAL_CAPSULE | Freq: Every day | ORAL | 0 refills | 30.00000 days | Status: CP
Start: 2024-04-07 — End: 2024-05-07

## 2024-04-07 MED ORDER — LEVOTHYROXINE 25 MCG TABLET
ORAL_TABLET | Freq: Every day | ORAL | 3 refills | 90.00000 days | Status: CP
Start: 2024-04-07 — End: 2025-04-02

## 2024-04-07 MED ORDER — HYDRALAZINE 100 MG TABLET
ORAL_TABLET | Freq: Three times a day (TID) | ORAL | 11 refills | 40.00000 days | Status: CP
Start: 2024-04-07 — End: ?

## 2024-04-07 MED ORDER — ALLOPURINOL 100 MG TABLET
ORAL_TABLET | Freq: Every day | ORAL | 3 refills | 90.00000 days | Status: CP
Start: 2024-04-07 — End: 2025-04-07

## 2024-04-15 DIAGNOSIS — N2889 Other specified disorders of kidney and ureter: Principal | ICD-10-CM

## 2024-04-19 ENCOUNTER — Inpatient Hospital Stay: Admit: 2024-04-19 | Discharge: 2024-04-20 | Payer: Medicare (Managed Care)

## 2024-04-20 MED ORDER — LEVOTHYROXINE 50 MCG TABLET
ORAL_TABLET | Freq: Every day | ORAL | 0 refills | 90.00000 days | Status: CP
Start: 2024-04-20 — End: 2024-07-19

## 2024-04-20 MED ORDER — AMLODIPINE 5 MG TABLET
ORAL_TABLET | Freq: Every day | ORAL | 0 refills | 90.00000 days | Status: CP
Start: 2024-04-20 — End: 2024-07-19

## 2024-04-20 MED ORDER — HYDRALAZINE 100 MG TABLET
ORAL_TABLET | Freq: Three times a day (TID) | ORAL | 0 refills | 90.00000 days | Status: CP
Start: 2024-04-20 — End: 2024-07-19

## 2024-04-20 MED ORDER — VALSARTAN 320 MG TABLET
ORAL_TABLET | Freq: Every day | ORAL | 0 refills | 90.00000 days | Status: CP
Start: 2024-04-20 — End: 2024-07-19

## 2024-04-20 MED ORDER — CEPHALEXIN 500 MG CAPSULE
ORAL_CAPSULE | Freq: Three times a day (TID) | ORAL | 0 refills | 13.00000 days | Status: CP
Start: 2024-04-20 — End: 2024-05-03

## 2024-04-20 MED ORDER — OXYCODONE 5 MG TABLET
ORAL_TABLET | Freq: Four times a day (QID) | ORAL | 0 refills | 5.00000 days | Status: CP | PRN
Start: 2024-04-20 — End: 2024-04-25

## 2024-04-20 MED ORDER — EMPAGLIFLOZIN 25 MG TABLET
ORAL_TABLET | Freq: Every day | ORAL | 0 refills | 90.00000 days | Status: CP
Start: 2024-04-20 — End: 2024-07-19

## 2024-04-20 MED ORDER — ONDANSETRON 4 MG DISINTEGRATING TABLET
ORAL_TABLET | Freq: Three times a day (TID) | ORAL | 0 refills | 7.00000 days | Status: CP | PRN
Start: 2024-04-20 — End: 2024-04-27

## 2024-04-21 DIAGNOSIS — Z09 Encounter for follow-up examination after completed treatment for conditions other than malignant neoplasm: Principal | ICD-10-CM

## 2024-04-22 DIAGNOSIS — C641 Malignant neoplasm of right kidney, except renal pelvis: Principal | ICD-10-CM

## 2024-04-25 DIAGNOSIS — I10 Essential (primary) hypertension: Principal | ICD-10-CM

## 2024-04-25 DIAGNOSIS — E118 Type 2 diabetes mellitus with unspecified complications: Principal | ICD-10-CM

## 2024-04-25 DIAGNOSIS — G629 Polyneuropathy, unspecified: Principal | ICD-10-CM

## 2024-04-25 DIAGNOSIS — E1122 Type 2 diabetes mellitus with diabetic chronic kidney disease: Principal | ICD-10-CM

## 2024-04-25 DIAGNOSIS — E119 Type 2 diabetes mellitus without complications: Principal | ICD-10-CM

## 2024-04-25 DIAGNOSIS — Z794 Long term (current) use of insulin: Principal | ICD-10-CM

## 2024-04-25 DIAGNOSIS — E039 Hypothyroidism, unspecified: Principal | ICD-10-CM

## 2024-04-25 DIAGNOSIS — N183 CKD stage 3 due to type 2 diabetes mellitus: Principal | ICD-10-CM

## 2024-04-25 MED ORDER — LEVOTHYROXINE 50 MCG TABLET
ORAL_TABLET | Freq: Every day | ORAL | 3 refills | 90.00000 days | Status: CP
Start: 2024-04-25 — End: ?

## 2024-04-25 MED ORDER — VALSARTAN 320 MG TABLET
ORAL_TABLET | Freq: Every day | ORAL | 3 refills | 90.00000 days | Status: CP
Start: 2024-04-25 — End: ?

## 2024-04-25 MED ORDER — PEN NEEDLE, DIABETIC 32 GAUGE X 5/32" (4 MM)
ORAL | 3 refills | 0.00000 days | Status: CP
Start: 2024-04-25 — End: ?

## 2024-04-25 MED ORDER — EMPAGLIFLOZIN 25 MG TABLET
ORAL_TABLET | Freq: Every day | ORAL | 3 refills | 90.00000 days | Status: CP
Start: 2024-04-25 — End: ?

## 2024-04-25 MED ORDER — FREESTYLE LITE STRIPS
ORAL_STRIP | Freq: Two times a day (BID) | 11 refills | 0.00000 days | Status: CP
Start: 2024-04-25 — End: ?

## 2024-04-25 MED ORDER — VENLAFAXINE ER 150 MG CAPSULE,EXTENDED RELEASE 24 HR
ORAL_CAPSULE | Freq: Every day | ORAL | 3 refills | 90.00000 days | Status: CP
Start: 2024-04-25 — End: ?

## 2024-04-25 MED ORDER — ATORVASTATIN 40 MG TABLET
ORAL_TABLET | Freq: Every day | ORAL | 3 refills | 90.00000 days | Status: CP
Start: 2024-04-25 — End: 2025-04-20

## 2024-04-25 MED ORDER — HYDRALAZINE 100 MG TABLET
ORAL_TABLET | Freq: Three times a day (TID) | ORAL | 3 refills | 90.00000 days | Status: CP
Start: 2024-04-25 — End: ?

## 2024-04-25 MED ORDER — AMLODIPINE 5 MG TABLET
ORAL_TABLET | Freq: Every day | ORAL | 3 refills | 90.00000 days | Status: CP
Start: 2024-04-25 — End: ?

## 2024-04-28 ENCOUNTER — Ambulatory Visit: Admit: 2024-04-28 | Payer: Medicare (Managed Care) | Attending: Nurse Practitioner | Primary: Nurse Practitioner

## 2024-04-28 ENCOUNTER — Ambulatory Visit: Admit: 2024-04-28 | Payer: Medicare (Managed Care)

## 2024-05-30 ENCOUNTER — Ambulatory Visit: Admit: 2024-05-30 | Payer: Medicare (Managed Care)

## 2024-05-30 ENCOUNTER — Ambulatory Visit: Admit: 2024-05-30 | Payer: Medicare (Managed Care) | Attending: Nurse Practitioner | Primary: Nurse Practitioner

## 2024-11-04 ENCOUNTER — Inpatient Hospital Stay (HOSPITAL_COMMUNITY)
Admission: EM | Admit: 2024-11-04 | Discharge: 2024-11-09 | DRG: 100 | Disposition: A | Discharge status: Skilled Nursing Facility | Payer: Self-pay | Attending: Internal Medicine | Admitting: Internal Medicine

## 2024-11-04 ENCOUNTER — Emergency Department (HOSPITAL_COMMUNITY): Payer: Self-pay

## 2024-11-04 DIAGNOSIS — M4856XA Collapsed vertebra, not elsewhere classified, lumbar region, initial encounter for fracture: Secondary | ICD-10-CM | POA: Diagnosis present

## 2024-11-04 DIAGNOSIS — N2889 Other specified disorders of kidney and ureter: Secondary | ICD-10-CM | POA: Diagnosis present

## 2024-11-04 DIAGNOSIS — R451 Restlessness and agitation: Secondary | ICD-10-CM | POA: Diagnosis not present

## 2024-11-04 DIAGNOSIS — E1165 Type 2 diabetes mellitus with hyperglycemia: Secondary | ICD-10-CM

## 2024-11-04 DIAGNOSIS — E785 Hyperlipidemia, unspecified: Secondary | ICD-10-CM | POA: Diagnosis present

## 2024-11-04 DIAGNOSIS — W19XXXA Unspecified fall, initial encounter: Secondary | ICD-10-CM | POA: Diagnosis not present

## 2024-11-04 DIAGNOSIS — Z7409 Other reduced mobility: Secondary | ICD-10-CM | POA: Diagnosis present

## 2024-11-04 DIAGNOSIS — F419 Anxiety disorder, unspecified: Secondary | ICD-10-CM | POA: Diagnosis present

## 2024-11-04 DIAGNOSIS — S32591A Other specified fracture of right pubis, initial encounter for closed fracture: Secondary | ICD-10-CM | POA: Diagnosis present

## 2024-11-04 DIAGNOSIS — J9601 Acute respiratory failure with hypoxia: Secondary | ICD-10-CM | POA: Diagnosis present

## 2024-11-04 DIAGNOSIS — N471 Phimosis: Secondary | ICD-10-CM | POA: Diagnosis present

## 2024-11-04 DIAGNOSIS — I10 Essential (primary) hypertension: Secondary | ICD-10-CM | POA: Diagnosis present

## 2024-11-04 DIAGNOSIS — E876 Hypokalemia: Secondary | ICD-10-CM | POA: Diagnosis present

## 2024-11-04 DIAGNOSIS — G40109 Localization-related (focal) (partial) symptomatic epilepsy and epileptic syndromes with simple partial seizures, not intractable, without status epilepticus: Secondary | ICD-10-CM | POA: Diagnosis not present

## 2024-11-04 DIAGNOSIS — R4182 Altered mental status, unspecified: Principal | ICD-10-CM

## 2024-11-04 DIAGNOSIS — F32A Depression, unspecified: Secondary | ICD-10-CM | POA: Diagnosis present

## 2024-11-04 DIAGNOSIS — Z7982 Long term (current) use of aspirin: Secondary | ICD-10-CM

## 2024-11-04 DIAGNOSIS — N179 Acute kidney failure, unspecified: Secondary | ICD-10-CM | POA: Diagnosis present

## 2024-11-04 DIAGNOSIS — Z1152 Encounter for screening for COVID-19: Secondary | ICD-10-CM

## 2024-11-04 DIAGNOSIS — N3289 Other specified disorders of bladder: Secondary | ICD-10-CM | POA: Diagnosis present

## 2024-11-04 DIAGNOSIS — I6523 Occlusion and stenosis of bilateral carotid arteries: Secondary | ICD-10-CM | POA: Diagnosis present

## 2024-11-04 DIAGNOSIS — E1100 Type 2 diabetes mellitus with hyperosmolarity without nonketotic hyperglycemic-hyperosmolar coma (NKHHC): Secondary | ICD-10-CM | POA: Diagnosis present

## 2024-11-04 DIAGNOSIS — Z781 Physical restraint status: Secondary | ICD-10-CM

## 2024-11-04 DIAGNOSIS — Z8546 Personal history of malignant neoplasm of prostate: Secondary | ICD-10-CM

## 2024-11-04 DIAGNOSIS — J189 Pneumonia, unspecified organism: Secondary | ICD-10-CM | POA: Diagnosis present

## 2024-11-04 DIAGNOSIS — B3742 Candidal balanitis: Secondary | ICD-10-CM | POA: Diagnosis present

## 2024-11-04 DIAGNOSIS — Z716 Tobacco abuse counseling: Secondary | ICD-10-CM

## 2024-11-04 DIAGNOSIS — G9341 Metabolic encephalopathy: Secondary | ICD-10-CM | POA: Diagnosis present

## 2024-11-04 DIAGNOSIS — R569 Unspecified convulsions: Secondary | ICD-10-CM

## 2024-11-04 DIAGNOSIS — Z7984 Long term (current) use of oral hypoglycemic drugs: Secondary | ICD-10-CM

## 2024-11-04 DIAGNOSIS — G40101 Localization-related (focal) (partial) symptomatic epilepsy and epileptic syndromes with simple partial seizures, not intractable, with status epilepticus: Principal | ICD-10-CM | POA: Diagnosis present

## 2024-11-04 DIAGNOSIS — M4854XA Collapsed vertebra, not elsewhere classified, thoracic region, initial encounter for fracture: Secondary | ICD-10-CM | POA: Diagnosis present

## 2024-11-04 LAB — DIFFERENTIAL
Abs Immature Granulocytes: 0.17 K/uL — ABNORMAL HIGH (ref 0.00–0.07)
Basophils Absolute: 0.1 K/uL (ref 0.0–0.1)
Basophils Relative: 1 %
Eosinophils Absolute: 0.2 K/uL (ref 0.0–0.5)
Eosinophils Relative: 2 %
Immature Granulocytes: 1 %
Lymphocytes Relative: 30 %
Lymphs Abs: 3.6 K/uL (ref 0.7–4.0)
Monocytes Absolute: 0.9 K/uL (ref 0.1–1.0)
Monocytes Relative: 7 %
Neutro Abs: 7 K/uL (ref 1.7–7.7)
Neutrophils Relative %: 59 %

## 2024-11-04 LAB — COMPREHENSIVE METABOLIC PANEL WITH GFR
ALT: 39 U/L (ref 0–44)
AST: 51 U/L — ABNORMAL HIGH (ref 15–41)
Albumin: 4.3 g/dL (ref 3.5–5.0)
Alkaline Phosphatase: 170 U/L — ABNORMAL HIGH (ref 38–126)
Anion gap: 13 (ref 5–15)
BUN: 29 mg/dL — ABNORMAL HIGH (ref 8–23)
CO2: 25 mmol/L (ref 22–32)
Calcium: 9.5 mg/dL (ref 8.9–10.3)
Chloride: 96 mmol/L — ABNORMAL LOW (ref 98–111)
Creatinine, Ser: 2 mg/dL — ABNORMAL HIGH (ref 0.61–1.24)
GFR, Estimated: 24 mL/min — ABNORMAL LOW
Glucose, Bld: 603 mg/dL (ref 70–99)
Potassium: 4.4 mmol/L (ref 3.5–5.1)
Sodium: 133 mmol/L — ABNORMAL LOW (ref 135–145)
Total Bilirubin: 0.5 mg/dL (ref 0.0–1.2)
Total Protein: 7 g/dL (ref 6.5–8.1)

## 2024-11-04 LAB — OSMOLALITY: Osmolality: 335 mosm/kg (ref 275–295)

## 2024-11-04 LAB — I-STAT CHEM 8, ED
BUN: 33 mg/dL — ABNORMAL HIGH (ref 8–23)
Calcium, Ion: 1.2 mmol/L (ref 1.15–1.40)
Chloride: 98 mmol/L (ref 98–111)
Creatinine, Ser: 1.9 mg/dL — ABNORMAL HIGH (ref 0.61–1.24)
Glucose, Bld: 580 mg/dL (ref 70–99)
HCT: 47 % (ref 39.0–52.0)
Hemoglobin: 16 g/dL (ref 13.0–17.0)
Potassium: 4.3 mmol/L (ref 3.5–5.1)
Sodium: 135 mmol/L (ref 135–145)
TCO2: 27 mmol/L (ref 22–32)

## 2024-11-04 LAB — CBG MONITORING, ED
Glucose-Capillary: 550 mg/dL (ref 70–99)
Glucose-Capillary: 518 mg/dL (ref 70–99)
Glucose-Capillary: 541 mg/dL (ref 70–99)
Glucose-Capillary: 472 mg/dL — ABNORMAL HIGH (ref 70–99)

## 2024-11-04 LAB — RESP PANEL BY RT-PCR (RSV, FLU A&B, COVID)  RVPGX2
Influenza A by PCR: NEGATIVE
Influenza B by PCR: NEGATIVE
Resp Syncytial Virus by PCR: NEGATIVE
SARS Coronavirus 2 by RT PCR: NEGATIVE

## 2024-11-04 LAB — CBC
HCT: 44.9 % (ref 39.0–52.0)
Hemoglobin: 15.2 g/dL (ref 13.0–17.0)
MCH: 31 pg (ref 26.0–34.0)
MCHC: 33.9 g/dL (ref 30.0–36.0)
MCV: 91.4 fL (ref 80.0–100.0)
Platelets: 271 K/uL (ref 150–400)
RBC: 4.91 MIL/uL (ref 4.22–5.81)
RDW: 13.3 % (ref 11.5–15.5)
WBC: 12.1 K/uL — ABNORMAL HIGH (ref 4.0–10.5)
nRBC: 0 % (ref 0.0–0.2)

## 2024-11-04 LAB — ETHANOL: Alcohol, Ethyl (B): <15 mg/dL

## 2024-11-04 LAB — APTT: aPTT: 22 s — ABNORMAL LOW (ref 24–36)

## 2024-11-04 LAB — PROTIME-INR
INR: 1 (ref 0.8–1.2)
Prothrombin Time: 13.4 s (ref 11.4–15.2)

## 2024-11-04 LAB — D-DIMER, QUANTITATIVE: D-Dimer, Quant: 8.67 ug{FEU}/mL — ABNORMAL HIGH (ref 0.00–0.50)

## 2024-11-04 LAB — PRO BRAIN NATRIURETIC PEPTIDE: Pro Brain Natriuretic Peptide: 1052 pg/mL — ABNORMAL HIGH

## 2024-11-04 LAB — TROPONIN T, HIGH SENSITIVITY: Troponin T High Sensitivity: 39 ng/L — ABNORMAL HIGH (ref 0–19)

## 2024-11-04 MED ADMIN — Sodium Chloride IV Soln 0.9%: 500 mL | INTRAVENOUS | NDC 00264580210

## 2024-11-04 MED ADMIN — Insulin Regular (Human) in NaCl 0.9% IV Soln 100 Unit/100ML: 10 [IU]/h | INTRAVENOUS | NDC 00338012612

## 2024-11-04 MED ADMIN — Levetiracetam Inj 500 MG/5ML (100 MG/ML): 3000 mg | INTRAVENOUS | NDC 00409201105

## 2024-11-04 MED ADMIN — AZITHROMYCIN 500 MG IVPB: 500 mg | INTRAVENOUS | NDC 70436001982

## 2024-11-04 MED ADMIN — Lorazepam Inj 2 MG/ML: 2 mg | INTRAVENOUS | NDC 65219036801

## 2024-11-04 MED ADMIN — Potassium Chloride Inj 10 mEq/100ML: 10 meq | INTRAVENOUS | NDC 00338070948

## 2024-11-04 MED ADMIN — Lorazepam Inj 2 MG/ML: 1 mg | INTRAVENOUS | NDC 65219036801

## 2024-11-04 MED ADMIN — CEFTRIAXONE  1 GM IVPB MIXTURE: 1 g | INTRAVENOUS | NDC 00143985701

## 2024-11-04 MED ADMIN — Iohexol IV Soln 350 MG/ML: 100 mL | INTRAVENOUS | NDC 00407141491

## 2024-11-04 MED ADMIN — Lactated Ringer's Solution: 1000 mL | INTRAVENOUS | NDC 00264775000

## 2024-11-04 MED ADMIN — Lactated Ringer's Solution: 125 mL/h | INTRAVENOUS | NDC 00264775000

## 2024-11-04 MED FILL — Levetiracetam Inj 500 MG/5ML (100 MG/ML): 500.0000 mg | INTRAVENOUS | Qty: 5 | Status: AC

## 2024-11-04 MED FILL — Azithromycin IV For Soln 500 MG: 500.0000 mg | INTRAVENOUS | Qty: 5 | Status: AC

## 2024-11-04 MED FILL — Lorazepam Inj 2 MG/ML: 1.0000 mg | INTRAMUSCULAR | Qty: 1 | Status: AC

## 2024-11-04 MED FILL — Lorazepam Inj 2 MG/ML: INTRAMUSCULAR | Qty: 1 | Status: AC

## 2024-11-04 MED FILL — Potassium Chloride Inj 10 mEq/100ML: 10.0000 meq | INTRAVENOUS | Qty: 100 | Status: AC

## 2024-11-04 MED FILL — Insulin Regular (Human) in NaCl 0.9% IV Soln 100 Unit/100ML: INTRAVENOUS | Qty: 100 | Status: AC

## 2024-11-04 MED FILL — Ceftriaxone Sodium For Inj 1 GM: 1.0000 g | INTRAMUSCULAR | Qty: 10 | Status: AC

## 2024-11-04 MED FILL — Lorazepam Inj 2 MG/ML: 2.0000 mg | INTRAMUSCULAR | Qty: 1 | Status: AC

## 2024-11-04 NOTE — ED Notes (Signed)
 EDP/ Neurologist at bedside w/ family.

## 2024-11-04 NOTE — ED Notes (Signed)
 Date and time results received: 11/04/2024 2050 (use smartphrase .now to insert current time)  Test: Osmolality Critical Value: 335  Name of Provider Notified: [facility]

## 2024-11-04 NOTE — ED Notes (Signed)
 XRAY AT BEDSIDE

## 2024-11-04 NOTE — Progress Notes (Signed)
 STAT LTM EEG hooked up and running - no initial skin breakdown. Difficult hookup for one tech.Unmonitored at this time pt is in the ED. MRI safe leads used.

## 2024-11-04 NOTE — ED Notes (Signed)
 Only able to complete one set of blood culture. Due to agitation.

## 2024-11-04 NOTE — ED Triage Notes (Signed)
 Pt BIB [facility] , pt found by [facility] staff on bathroom floor  w/ unknown downtime. Upon EMS arrival  [facility], right sided gaze, aphasia, absent strength grip right arm, right arm neglect, right facial droop  BP 226/101 02 82% RA, 99% 6 liters Hr107 Cbg= 570 98.6 Sinus tach EKG  18g R AaC

## 2024-11-04 NOTE — ED Notes (Signed)
 On ra pt desat  while in ct 76% ram, 6L applied 94%

## 2024-11-04 NOTE — ED Notes (Signed)
Seizure pads on bedrails

## 2024-11-04 NOTE — H&P (Addendum)
 NEUROLOGY CONSULT NOTE   Date of service: November 04, 2024 Patient Name: [name] [name] MRN:  [id number] DOB:  11/10/1873 Chief Complaint: Found down unresponsive Requesting Provider: Dr. Franklyn  History of Present Illness  [name] [name] is a 89 y.o. male with no known PMHx except for diabetes (unidentified at time of presentation). He was found down at [facility] in the bathroom. Staff think he was in there for 15 minutes. Found with right gaze, aphasia, right facial froop, right sided weakness, and right neglect. No seizure activity reported by EMS. BP 226/101, CBG 570, Initially 82% on room air and then 99% on 6L. On arrival to the ED, he was stuporous, nonverbal and not following commands, with right sided gaze, head turned to the right and some intermittent left sided movement, but flaccid RUE and RLE. No seizure activity seen immediately on arrival, but intermittent subtle right sided jerking was present in CT.   Family in the room after CT. They state that they last saw him normal at 7 PM yesterday. He has a history of HTN, DM with frequent hypoglycemic episodes, partial medication compliance, depression, and prostate cancer s/p resection. He has no prior history of seizure that they know of. Also no history of stroke or MI. He does not drink and is not taking any benzodiazepines, per family.   LKW: 7 PM Thursday Modified rankin score: 0 IV Thrombolysis: No: Out of the TNK time window EVT: No: No LVO on CTA   NIHSS components Score: Comment  1a Level of Conscious 0[]  1[]  2[x]  3[]      1b LOC Questions 0[]  1[]  2[x]       1c LOC Commands 0[]  1[]  2[x]       2 Best Gaze 0[]  1[]  2[x]      Right gaze deviation  3 Visual 0[]  1[]  2[]  3[x]     No blink to threat on right or left  4 Facial Palsy 0[x]  1[]  2[]  3[]      5a Motor Arm - left 0[]  1[]  2[]  3[x]  4[]  UN[]    5b Motor Arm - Right 0[]  1[]  2[]  3[]  4[x]  UN[]    6a Motor Leg - Left 0[]  1[]  2[]  3[x]  4[]  UN[]    6b Motor Leg - Right 0[]  1[]  2[]   3[]  4[x]  UN[]    7 Limb Ataxia 0[x]  1[]  2[]  UN[]      8 Sensory 0[]  1[]  2[x]  UN[]      9 Best Language 0[]  1[]  2[]  3[x]      10 Dysarthria 0[]  1[]  2[x]  UN[]      11 Extinct. and Inattention 0[x]  1[]  2[]       TOTAL:  32   Other findings: Intermittent right upper extremity subtle jerking movements. Intermittent right foot rhythmic twitching. Subtle right lower facial twitching noted once during exam.    ROS  Unable to ascertain due to stupor.    Past History  No past medical history on file.   Family History: No family history on file.  Social History  has no history on file for tobacco use, alcohol use, and drug use.  Allergies[1]  Medications  Current Medications[2]  Vitals   There were no vitals filed for this visit.  There is no height or weight on file to calculate BMI. Wt 78.2 kg    Physical Exam   Constitutional: Appears well-developed and well-nourished.  Psych: Stuporous Eyes: No scleral injection.  HENT: No OP obstruction.  Head: Normocephalic. No neck stiffness Respiratory: Effort normal, non-labored breathing. Intermittent wet cough and grossly audible gurgling.  Skin:  Cool skin to forehead and distal extremities. No cyanosis.   Neurologic Examination   See NIHSS  Labs/Imaging/Neurodiagnostic studies   CT head: No acute intracranial abnormality. ASPECTS 10.  ASSESSMENT  [name] [name] is a 89 y.o. male with no known PMHx except for diabetes (unidentified at time of presentation). He was found down at [facility] in the bathroom. Staff think he was in there for 15 minutes. Found with right gaze, aphasia, right facial froop, right sided weakness, and right neglect. No seizure activity reported by EMS. BP 226/101, CBG 570, Initially 82% on room air and then 99% on 6L. On arrival to the ED, he was stuporous, nonverbal and not following commands, with right sided gaze, head turned to the right and some intermittent left sided movement, but flaccid RUE and RLE. No  seizure activity seen immediately on arrival, but intermittent subtle right sided jerking was present in CT. CT angio shows show bulky calcified plaque with string sign.  - Exam reveals a stuporous patient with intermittent subtle right face, arm and leg twitching.  - CT head: No acute intracranial abnormality. ASPECTS 10. - CTA of head and neck: No large vessel occlusion. No evidence of ischemia by CT brain perfusion. Mixed atherosclerotic plaque at the right carotid bifurcation with approximately 85% stenosis at the origin of the right cervical ICA. Mixed atherosclerotic plaque at the left carotid bifurcation with approximately 60% stenosis at the origin of the left cervical ICA. Severe stenosis at the origin of the nondominant left vertebral artery. Atherosclerosis of the bilateral carotid siphons with moderate stenosis of the bilateral cavernous and paraophthalmic ICAs. 2 mm inferiorly directed outpouching along the left supraclinoid ICA, possibly a small aneurysm versus infundibulum at the origin of the posterior communicating artery. Additional 2 mm inferiorly directed outpouching at the P1 and P2 junction of the left PCA, concerning for a small aneurysm. Question prominence of the bilateral optic nerve sheath complexes. Recommend correlation with fundoscopic exam findings. Sclerosis in the right aspect of the C7 vertebral body and right pedicle, which could reflect an osseous lesion. Recommend MRI of the cervical spine with and without contrast.  Opacities and bronchial wall thickening in the medial right upper lobe, which may be related to infection. 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  - Not a TNK candidate due to being outside of the TNK time window. 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  - Cardiac telemetry and continuous pulse oximetry with alarm - Inpatient seizure precautions - Outpatient seizure precautions: Per [place]  DMV statutes, patients with seizures are not allowed to drive until  they have been seizure-free for six months. Use caution when using heavy equipment or power tools. Avoid working on ladders or at heights. Take showers instead of baths. Ensure the water temperature is not too high on the home water heater. Do not go swimming alone. When caring for  infants or small children, sit down when holding, feeding, or changing them to minimize risk of injury to the child in the event you have a seizure. Also, Maintain good sleep hygiene. Avoid alcohol.   Addendum: - No further seizure activity seen after Keppra  load - Repeat exam after CT reveals improved right sided motor function. Now withdrawing and flailing/swatting with RUE to noxious arm pinch and withdrawing RLE to noxious plantar stimulation. Eyes are now midline. LUE and LLE moving normally to noxious stimulation (brisk withdrawal with LUE and LLE to noxious; swatting and flailing of LUE to noxious). He continues to be nonverbal and not following any commands. Overall exam findings are most consistent with postictal state.  - Continue Keppra  at 500 mg IV BID - Will need Vascular Surgery consult for possible elective CEA on the right, left or both sides.  - Case discussed with Dr. Vanessa in sign out.   ______________________________________________________________________    Bonney Jorene Last, NP [facility]  I have seen and examined the patient. I have formulated the assessment and recommendations. Elderly male with a history of DM found down in a bathroom at [facility]. NIHSS in the context of unresponsive state is 32. Intermittent right sided jerking noted. Loaded with Keppra  after 2 mg IV Ativan , resulting in cessation of right sided jerking and recovery of right sided movement. Recommendations as above.  Electronically signed: Dr. Camellia Shark      [1] Not on File [2]  Current Facility-Administered Medications:    sodium chloride  flush (NS) 0.9 % injection 3 mL, 3 mL, Intravenous, Once, Lenor Hollering, MD No current outpatient medications on file.

## 2024-11-04 NOTE — Code Documentation (Signed)
 Stroke Response Nurse Documentation Code Documentation  [name] [name] is a 89 y.o. male arriving to [facility]  via [facility] on 12/26 with past medical hx of DM. On No antithrombotic. Code stroke was activated by EMS.   Patient from [facility] where he was LKW yesterday at 97 by family at a Christmas gathering but was found unresponsive in the bathroom with R sided weakness.   Stroke team at the bedside on patient arrival. Labs drawn and patient cleared for CT by Dr. Franklyn. Patient to CT with team. NIHSS 32, see documentation for details and code stroke times. Patient with decreased LOC, disoriented, not following commands, right gaze preference , bilateral hemianopia, bilateral arm weakness, bilateral leg weakness, right decreased sensation, Global aphasia , and dysarthria  on exam. The following imaging was completed:  CT Head, CTA, and CTP. Patient is not a candidate for IV Thrombolytic due to out of treatment window. Patient is not a candidate for IR due to no LVO on CTA.   Care Plan: EEG, Q2 NIHSS and vitals.   Bedside handoff with ED RN Jinnie.    Lauraine LITTIE Searle  Stroke Response RN

## 2024-11-04 NOTE — Progress Notes (Signed)
" °   11/04/24 2025  Spiritual Encounters  Type of Visit Initial  Care provided to: Family  Reason for visit Trauma  OnCall Visit Yes   Provided support to spouse in hallway while waiting to continue visit. Also provided water.  "

## 2024-11-04 NOTE — ED Provider Notes (Addendum)
 "  EMERGENCY DEPARTMENT AT [facility] Provider Note   CSN: [id number] Arrival date & time: 11/04/24  1821  An emergency department physician performed an initial assessment on this suspected stroke patient at 1822.  Patient presents with: Code Stroke   Maveryck Bahri is a 89 y.o. male.   Patient is an unknown aged elderly male who presents as a John Doe/code stroke.  Per EMS, he was found in a [facility] bathroom unresponsive.  It is unclear how long he was there.  His family members advised that he was last seen yesterday evening around 8 PM.  They do say that his car was parked off in the parking lot and he may have been having some issues when he was driving into the [facility].  He has a history of diabetes, hypertension.  No known prior history of seizures.  The family says he does not really go to the doctor much and does not take his medications regularly.  On arrival, he was having some gaze deviation.       Prior to Admission medications  Not on File    Allergies: Patient has no allergy information on record.    Review of Systems  Unable to perform ROS: Mental status change    Updated Vital Signs BP (!) 169/84   Pulse (!) 104   Temp 98.4 F (36.9 C) (Rectal)   Resp 20   Wt 78.2 kg   SpO2 94%   Physical Exam Constitutional:      Appearance: He is well-developed. He is ill-appearing.  HENT:     Head: Normocephalic and atraumatic.  Eyes:     Pupils: Pupils are equal, round, and reactive to light.  Cardiovascular:     Rate and Rhythm: Normal rate and regular rhythm.     Heart sounds: Normal heart sounds.  Pulmonary:     Effort: Pulmonary effort is normal. No respiratory distress.     Breath sounds: Rhonchi present. No wheezing or rales.     Comments: Mild tachypnea Chest:     Chest wall: No tenderness.  Abdominal:     General: Bowel sounds are normal.     Palpations: Abdomen is soft.     Tenderness: There is no abdominal tenderness.  There is no guarding or rebound.  Musculoskeletal:        General: Normal range of motion.     Cervical back: Normal range of motion and neck supple.  Lymphadenopathy:     Cervical: No cervical adenopathy.  Skin:    General: Skin is warm and dry.     Findings: No rash.  Neurological:     Comments: Unresponsive, will respond to painful stimuli and is moving all of his extremities but is not following commands     (all labs ordered are listed, but only abnormal results are displayed) Labs Reviewed  APTT - Abnormal; Notable for the following components:      Result Value   aPTT 22 (*)    All other components within normal limits  CBC - Abnormal; Notable for the following components:   WBC 12.1 (*)    All other components within normal limits  DIFFERENTIAL - Abnormal; Notable for the following components:   Abs Immature Granulocytes 0.17 (*)    All other components within normal limits  COMPREHENSIVE METABOLIC PANEL WITH GFR - Abnormal; Notable for the following components:   Sodium 133 (*)    Chloride 96 (*)    Glucose, Bld 603 (*)  BUN 29 (*)    Creatinine, Ser 2.00 (*)    AST 51 (*)    Alkaline Phosphatase 170 (*)    GFR, Estimated 24 (*)    All other components within normal limits  PRO BRAIN NATRIURETIC PEPTIDE - Abnormal; Notable for the following components:   Pro Brain Natriuretic Peptide 1,052.0 (*)    All other components within normal limits  OSMOLALITY - Abnormal; Notable for the following components:   Osmolality 335 (*)    All other components within normal limits  I-STAT CHEM 8, ED - Abnormal; Notable for the following components:   BUN 33 (*)    Creatinine, Ser 1.90 (*)    Glucose, Bld 580 (*)    All other components within normal limits  CBG MONITORING, ED - Abnormal; Notable for the following components:   Glucose-Capillary 550 (*)    All other components within normal limits  CBG MONITORING, ED - Abnormal; Notable for the following components:    Glucose-Capillary 518 (*)    All other components within normal limits  TROPONIN T, HIGH SENSITIVITY - Abnormal; Notable for the following components:   Troponin T High Sensitivity 39 (*)    All other components within normal limits  RESP PANEL BY RT-PCR (RSV, FLU A&B, COVID)  RVPGX2  CULTURE, BLOOD (ROUTINE X 2)  CULTURE, BLOOD (ROUTINE X 2)  PROTIME-INR  ETHANOL  URINALYSIS, W/ REFLEX TO CULTURE (INFECTION SUSPECTED)  URINE DRUG SCREEN  D-DIMER, QUANTITATIVE  BASIC METABOLIC PANEL WITH GFR  BASIC METABOLIC PANEL WITH GFR  BASIC METABOLIC PANEL WITH GFR  BASIC METABOLIC PANEL WITH GFR  URINALYSIS, ROUTINE W REFLEX MICROSCOPIC  CBG MONITORING, ED  TROPONIN T, HIGH SENSITIVITY    EKG: None  Radiology: DG Chest 1 View Result Date: 11/04/2024 EXAM: 1 VIEW(S) XRAY OF THE CHEST 11/04/2024 07:06:00 PM COMPARISON: None available. CLINICAL HISTORY: cough/found down FINDINGS: LUNGS AND PLEURA: Low lung volumes. Patchy airspace opacities at left lung base. Question right apical nodule-like density versus overlapping osseous structures. No pleural effusion. No pneumothorax. HEART AND MEDIASTINUM: Aortic atherosclerosis. No acute abnormality of the cardiac and mediastinal silhouettes. BONES AND SOFT TISSUES: No acute osseous abnormality. IMPRESSION: 1. Low lung volumes with patchy left basilar airspace opacities, which may reflect atelectasis or pneumonia. Recommend repeat PA and lateral view of the chest with improved inspiratory effort to further evaluate. 2. Question right apical nodule-like density versus overlapping osseous structures. Finding can also be further evaluated on repeat chest x-ray. Electronically signed by: Morgane Naveau MD 11/04/2024 08:57 PM EST RP Workstation: [id number]   CT ANGIO HEAD NECK W WO CM W PERF (CODE STROKE) Result Date: 11/04/2024 EXAM: CT ANGIOGRAPHY OF THE HEAD AND NECK CT PERFUSION BRAIN 11/04/2024 06:45:49 PM TECHNIQUE: Contiguous axial images were obtained  from the base of the skull through the vertex without intravenous contrast. Multidetector CT imaging of the head and neck was performed using the standard protocol during bolus administration of 100 mL of iohexol  (OMNIPAQUE ) 350 MG/ML injection. 3D postprocessing with multiplanar reconstructions and MIPs was performed to evaluate the vascular anatomy. Carotid stenosis measurements (when applicable) are obtained utilizing NASCET criteria, using the distal internal carotid diameter as the denominator. Cerebral perfusion analysis using computed tomography with contrast administration, including post-processing of parametric maps with determination of cerebral blood flow, cerebral blood volume, mean transit time and time-to-maximum. RADIATION DOSE REDUCTION: This exam was performed according to the departmental dose-optimization program which includes automated exposure control, adjustment of the mA and/or kV according  to patient size and/or use of iterative reconstruction technique. CONTRAST: Without and with IV contrast. 100 mL of iohexol  (OMNIPAQUE ) 350 MG/ML injection. COMPARISON: CT head dated 11/04/2024. CLINICAL HISTORY: Neuro deficit, acute, stroke suspected. FINDINGS: CT HEAD: Question prominence of the bilateral optic nerve sheath complexes; recommend correlation with fundoscopic exam findings. CTA NECK: AORTIC ARCH AND ARCH VESSELS: Moderate atherosclerosis of the visualized aortic arch. 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 Atherosclerosis at the origin of the aberrant right subclavian resulting in focal mild stenosis. Moderate stenosis at the origin of the left subclavian artery. No dissection or arterial injury. CERVICAL CAROTID ARTERIES: Mixed atherosclerotic plaque at the right carotid bifurcation resulting in 85% stenosis of the proximal right cervical ICA. Additional moderate stenosis at the origin of the right external carotid artery. Mixed atherosclerotic plaque at the left  carotid bifurcation which results in approximately 60% stenosis at the origin of the left cervical ICA. No dissection or arterial injury. CERVICAL VERTEBRAL ARTERIES: Right vertebral artery origin is obscured due to streak artifact from dense venous contrast. The right vertebral artery is dominant. Atherosclerosis of the origin of the nondominant left vertebral artery resulting in focal severe stenosis. No dissection or arterial injury. LUNGS AND MEDIASTINUM: Opacities within the medial aspect of the right upper lobe. There is bronchial wall thickening within the right upper lobe which may be related to infection. SOFT TISSUES: Hypoplastic appearance of the thyroid with nonvisualization of the left thyroid lobe. BONES: Degenerative changes in the visualized spine. There is 5 mm anterolisthesis of C3 on C4 likely related to chronic facet degenerative changes. Dental caries. Sclerosis in the right aspect of the C7 vertebral body and the right pedicle of C7 which could reflect an osseous lesion; recommend correlation with MRI of the cervical spine. CTA HEAD: ANTERIOR CIRCULATION: Atherosclerosis of the bilateral carotid siphons. There is moderate stenosis of the bilateral cavernous and paraophthalmic ICAs. There is a 2 mm inferiorly directed outpouching along the left supraclinoid ICA which may reflect a small aneurysm versus infundibulum at the origin of the posterior communicating artery. A1 segment of the right ACA likely congenital. No significant stenosis of the anterior cerebral arteries. No significant stenosis of the middle cerebral arteries. POSTERIOR CIRCULATION: There is a 2 mm inferiorly directed outpouching at the P1 and P2 junction of the left PCA concerning for small aneurysm (series 5, image 134). No significant stenosis of the posterior cerebral arteries. No significant stenosis of the basilar artery. No significant stenosis of the vertebral arteries. OTHER: No dural venous sinus thrombosis on this  non-dedicated study. CT PERFUSION: EXAM QUALITY: Exam quality is adequate with diagnostic perfusion maps. No significant motion artifact. Appropriate arterial inflow and venous outflow curves. CORE INFARCT (CBF<30% volume): 0 mL TOTAL HYPOPERFUSION (Tmax>6s volume): 3 mL PENUMBRA: Mismatch volume: 3 mL Mismatch ratio: not applicable Location: not applicable IMPRESSION: 1. No large vessel occlusion. 2. No evidence of ischemia by CT brain perfusion. 3. Mixed atherosclerotic plaque at the right carotid bifurcation with approximately 85% stenosis at the origin of the right cervical ICA. 4. Mixed atherosclerotic plaque at the left carotid bifurcation with approximately 60% stenosis at the origin of the left cervical ICA. 5. Severe stenosis at the origin of the nondominant left vertebral artery. 6. Atherosclerosis of the bilateral carotid siphons with moderate stenosis of the bilateral cavernous and paraophthalmic ICAs. 7. 2 mm inferiorly directed outpouching along the left supraclinoid ICA, possibly a small aneurysm versus infundibulum at the origin of the posterior communicating artery.  8. Additional 2 mm inferiorly directed outpouching at the P1 and P2 junction of the left PCA, concerning for a small aneurysm. 9. Question prominence of the bilateral optic nerve sheath complexes. Recommend correlation with fundoscopic exam findings. 10. Sclerosis in the right aspect of the C7 vertebral body and right pedicle, which could reflect an osseous lesion. Recommend MRI of the cervical spine with and without contrast. 11. Opacities and bronchial wall thickening in the medial right upper lobe, which may be related to infection. Electronically signed by: Donnice Mania MD 11/04/2024 07:21 PM EST RP Workstation: [id number]   CT HEAD CODE STROKE WO CONTRAST Result Date: 11/04/2024 EXAM: CT HEAD WITHOUT CONTRAST 11/04/2024 06:32:10 PM TECHNIQUE: CT of the head was performed without the administration of intravenous contrast.  Automated exposure control, iterative reconstruction, and/or weight based adjustment of the mA/kV was utilized to reduce the radiation dose to as low as reasonably achievable. COMPARISON: None available. CLINICAL HISTORY: Neuro deficit, acute, stroke suspected. FINDINGS: BRAIN AND VENTRICLES: No acute hemorrhage. No evidence of acute infarct. Mild to moderate chronic microvascular ischemic changes and generalized parenchymal volume loss. Skull base atherosclerosis involving the carotid siphons and intracranial vertebral arteries. No hydrocephalus. No extra-axial collection. No mass effect or midline shift. [place] Stroke Program Early CT (ASPECT) Score: Ganglionic (caudate, internal capsule, lentiform nucleus, insula, M1-M3): 7 Supraganglionic (M4-M6): 3 Total: 10 ORBITS: Bilateral lens replacement. SINUSES: No acute abnormality. SOFT TISSUES AND SKULL: No acute soft tissue abnormality. No skull fracture. Chronic bilateral nasal bone deformities. IMPRESSION: 1. No acute intracranial abnormality. 2. ASPECTS 10. 3. Findings messaged to Dr. Lindzen at 6:39 PM on 11/04/24. Electronically signed by: Donnice Mania MD 11/04/2024 06:41 PM EST RP Workstation: [id number]     Procedures   Medications Ordered in the ED  sodium chloride  flush (NS) 0.9 % injection 3 mL (3 mLs Intravenous Not Given 11/04/24 1930)  levETIRAcetam  (KEPPRA ) undiluted injection 500 mg (has no administration in time range)  azithromycin  (ZITHROMAX ) 500 mg in sodium chloride  0.9 % 250 mL IVPB (500 mg Intravenous New Bag/Given 11/04/24 2119)  insulin  regular, human (MYXREDLIN ) 100 units/ 100 mL infusion (has no administration in time range)  lactated ringers  infusion (has no administration in time range)  dextrose  5 % in lactated ringers  infusion (has no administration in time range)  dextrose  50 % solution 0-50 mL (has no administration in time range)  potassium chloride  10 mEq in 100 mL IVPB (has no administration in time range)  sodium  chloride 0.9 % bolus 500 mL (has no administration in time range)  levETIRAcetam  (KEPPRA ) undiluted injection 3,000 mg (3,000 mg Intravenous Given 11/04/24 1842)  LORazepam  (ATIVAN ) injection 2 mg (2 mg Intravenous Given 11/04/24 1830)  iohexol  (OMNIPAQUE ) 350 MG/ML injection 100 mL (100 mLs Intravenous Contrast Given 11/04/24 1847)  lactated ringers  bolus 1,000 mL (0 mLs Intravenous Stopped 11/04/24 2049)  LORazepam  (ATIVAN ) injection 1 mg (1 mg Intravenous Given 11/04/24 2011)  cefTRIAXone  (ROCEPHIN ) 1 g in sodium chloride  0.9 % 100 mL IVPB (0 g Intravenous Stopped 11/04/24 2151)                                    Medical Decision Making Amount and/or Complexity of Data Reviewed Labs: ordered. Radiology: ordered.  Risk Prescription drug management. Decision regarding hospitalization.   This patient presents to the ED for concern of altered mental status, this involves an extensive number of treatment options, and is  a complaint that carries with it a high risk of complications and morbidity.  I considered the following differential and admission for this acute, potentially life threatening condition.  The differential diagnosis includes stroke, seizure, meningitis, aspiration, pneumonia, viral syndrome, other infection, intracranial hemorrhage, brain mass  MDM:    Patient presents as a John Doe.  Code stroke was activated by EMS given his gaze deviation.  Head CT does not show any intracranial hemorrhage or other acute abnormality.  CTA shows some severe narrowing of his ICA arteries bilaterally.  He was given Ativan  and Keppra  by neurology on arrival.  He had an episode and CT where his oxygen saturations dropped.  He had rhonchi bilaterally.  He was placed on nasal cannula at 6 L/min.  He is maintaining good oxygenation on this.  On my evaluation, his gaze had normalized.  He is becoming more responsive.  He has an active gag reflex and has had some ongoing coughing intermittently.   Chest x-ray shows evidence of possible left lower lobe infiltrate versus atelectasis.  He is afebrile.  Given that and the CT findings of possible pneumonia in the upper lobes, we will start antibiotics including Rocephin  and Zithromax .  Have a lower suspicion for PE.  However were not able to get a CTA given his depressed kidney function.  His creatinine is 2 with a GFR of 24.  Will check a D-dimer.  He is starting to move around and move his extremities although he still not following commands.  He has an active cough reflex and is protecting his airway.  Labs show an elevated glucose.  No anion gap or evidence of acidosis.  However his osmolality is mildly elevated.  He was started on IV fluids and insulin  drip for possible HHS. Discussed with Dr. Alfornia who will admit the patient for further treatment.  Patient was getting increasingly agitated.  Was given additional dose of Ativan .  Discussed with Dr. Maree with CCM whether patient should be admitted to the ICU.  They will come down evaluate the patient.  CRITICAL CARE Performed by: Andrea Ness Total critical care time: 80 minutes Critical care time was exclusive of separately billable procedures and treating other patients. Critical care was necessary to treat or prevent imminent or life-threatening deterioration. Critical care was time spent personally by me on the following activities: development of treatment plan with patient and/or surrogate as well as nursing, discussions with consultants, evaluation of patient's response to treatment, examination of patient, obtaining history from patient or surrogate, ordering and performing treatments and interventions, ordering and review of laboratory studies, ordering and review of radiographic studies, pulse oximetry and re-evaluation of patient's condition.  (Labs, imaging, consults)  Labs: I Ordered, and personally interpreted labs.  The pertinent results include: Elevated glucose, no acidosis  increased osmolality, increased creatinine with depressed GFR  Imaging Studies ordered: I ordered imaging studies including CT head, CTA head and neck, chest x-ray I independently visualized and interpreted imaging. I agree with the radiologist interpretation  Additional history obtained from family at bedside.  External records from outside source obtained and reviewed including prior history  Cardiac Monitoring: The patient was maintained on a cardiac monitor.  If on the cardiac monitor, I personally viewed and interpreted the cardiac monitored which showed an underlying rhythm of: Sinus tachycardia  Reevaluation: After the interventions noted above, I reevaluated the patient and found that they have :improved  Social Determinants of Health:    Disposition: Admit to hospital  Co  morbidities that complicate the patient evaluation No past medical history on file.   Medicines Meds ordered this encounter  Medications   sodium chloride  flush (NS) 0.9 % injection 3 mL   levETIRAcetam  (KEPPRA ) undiluted injection 3,000 mg   LORazepam  (ATIVAN ) injection 2 mg   iohexol  (OMNIPAQUE ) 350 MG/ML injection 100 mL   lactated ringers  bolus 1,000 mL   levETIRAcetam  (KEPPRA ) undiluted injection 500 mg   LORazepam  (ATIVAN ) injection 1 mg   cefTRIAXone  (ROCEPHIN ) 1 g in sodium chloride  0.9 % 100 mL IVPB    Antibiotic Indication::   CAP   azithromycin  (ZITHROMAX ) 500 mg in sodium chloride  0.9 % 250 mL IVPB   insulin  regular, human (MYXREDLIN ) 100 units/ 100 mL infusion    EndoTool Goal Range::   140-180    Type of Diabetes:   Type 2    Mode of Therapy:   ENDOX2 for HHS    Start Method:   EndoTool to calculate   lactated ringers  infusion   dextrose  5 % in lactated ringers  infusion   dextrose  50 % solution 0-50 mL   potassium chloride  10 mEq in 100 mL IVPB   sodium chloride  0.9 % bolus 500 mL    I have reviewed the patients home medicines and have made adjustments as needed  Problem List /  ED Course: Problem List Items Addressed This Visit   None Visit Diagnoses       Altered mental status, unspecified altered mental status type    -  Primary     Acute respiratory failure with hypoxia (HCC)         Community acquired pneumonia of left lower lobe of lung       Relevant Medications   cefTRIAXone  (ROCEPHIN ) 1 g in sodium chloride  0.9 % 100 mL IVPB (Completed)   azithromycin  (ZITHROMAX ) 500 mg in sodium chloride  0.9 % 250 mL IVPB                Final diagnoses:  Altered mental status, unspecified altered mental status type  Acute respiratory failure with hypoxia [facility])  Community acquired pneumonia of left lower lobe of lung    ED Discharge Orders     None          Lenor Hollering, MD 11/04/24 2159    Lenor Hollering, MD 11/04/24 2349  "

## 2024-11-05 DIAGNOSIS — Z781 Physical restraint status: Secondary | ICD-10-CM | POA: Diagnosis not present

## 2024-11-05 DIAGNOSIS — G9341 Metabolic encephalopathy: Secondary | ICD-10-CM

## 2024-11-05 DIAGNOSIS — F419 Anxiety disorder, unspecified: Secondary | ICD-10-CM | POA: Diagnosis present

## 2024-11-05 DIAGNOSIS — B3742 Candidal balanitis: Secondary | ICD-10-CM | POA: Diagnosis present

## 2024-11-05 DIAGNOSIS — E119 Type 2 diabetes mellitus without complications: Secondary | ICD-10-CM | POA: Diagnosis not present

## 2024-11-05 DIAGNOSIS — N2889 Other specified disorders of kidney and ureter: Secondary | ICD-10-CM | POA: Diagnosis present

## 2024-11-05 DIAGNOSIS — R569 Unspecified convulsions: Secondary | ICD-10-CM

## 2024-11-05 DIAGNOSIS — E785 Hyperlipidemia, unspecified: Secondary | ICD-10-CM | POA: Diagnosis present

## 2024-11-05 DIAGNOSIS — W19XXXA Unspecified fall, initial encounter: Secondary | ICD-10-CM | POA: Diagnosis present

## 2024-11-05 DIAGNOSIS — M4856XA Collapsed vertebra, not elsewhere classified, lumbar region, initial encounter for fracture: Secondary | ICD-10-CM | POA: Diagnosis present

## 2024-11-05 DIAGNOSIS — E1165 Type 2 diabetes mellitus with hyperglycemia: Secondary | ICD-10-CM | POA: Diagnosis not present

## 2024-11-05 DIAGNOSIS — N3289 Other specified disorders of bladder: Secondary | ICD-10-CM | POA: Diagnosis present

## 2024-11-05 DIAGNOSIS — E876 Hypokalemia: Secondary | ICD-10-CM | POA: Diagnosis present

## 2024-11-05 DIAGNOSIS — Z7409 Other reduced mobility: Secondary | ICD-10-CM | POA: Diagnosis present

## 2024-11-05 DIAGNOSIS — I6523 Occlusion and stenosis of bilateral carotid arteries: Secondary | ICD-10-CM | POA: Diagnosis present

## 2024-11-05 DIAGNOSIS — M4854XA Collapsed vertebra, not elsewhere classified, thoracic region, initial encounter for fracture: Secondary | ICD-10-CM | POA: Diagnosis present

## 2024-11-05 DIAGNOSIS — J189 Pneumonia, unspecified organism: Secondary | ICD-10-CM | POA: Diagnosis present

## 2024-11-05 DIAGNOSIS — E1100 Type 2 diabetes mellitus with hyperosmolarity without nonketotic hyperglycemic-hyperosmolar coma (NKHHC): Secondary | ICD-10-CM | POA: Diagnosis present

## 2024-11-05 DIAGNOSIS — J9601 Acute respiratory failure with hypoxia: Secondary | ICD-10-CM | POA: Diagnosis present

## 2024-11-05 DIAGNOSIS — I7090 Unspecified atherosclerosis: Secondary | ICD-10-CM | POA: Diagnosis not present

## 2024-11-05 DIAGNOSIS — Z7982 Long term (current) use of aspirin: Secondary | ICD-10-CM | POA: Diagnosis not present

## 2024-11-05 DIAGNOSIS — N179 Acute kidney failure, unspecified: Secondary | ICD-10-CM | POA: Diagnosis present

## 2024-11-05 DIAGNOSIS — G40109 Localization-related (focal) (partial) symptomatic epilepsy and epileptic syndromes with simple partial seizures, not intractable, without status epilepticus: Secondary | ICD-10-CM | POA: Diagnosis not present

## 2024-11-05 DIAGNOSIS — I10 Essential (primary) hypertension: Secondary | ICD-10-CM | POA: Diagnosis present

## 2024-11-05 DIAGNOSIS — S32591A Other specified fracture of right pubis, initial encounter for closed fracture: Secondary | ICD-10-CM | POA: Diagnosis present

## 2024-11-05 DIAGNOSIS — N471 Phimosis: Secondary | ICD-10-CM | POA: Diagnosis present

## 2024-11-05 DIAGNOSIS — F32A Depression, unspecified: Secondary | ICD-10-CM | POA: Diagnosis present

## 2024-11-05 DIAGNOSIS — R451 Restlessness and agitation: Secondary | ICD-10-CM | POA: Diagnosis not present

## 2024-11-05 DIAGNOSIS — Z1152 Encounter for screening for COVID-19: Secondary | ICD-10-CM | POA: Diagnosis not present

## 2024-11-05 DIAGNOSIS — G40101 Localization-related (focal) (partial) symptomatic epilepsy and epileptic syndromes with simple partial seizures, not intractable, with status epilepticus: Secondary | ICD-10-CM | POA: Diagnosis present

## 2024-11-05 DIAGNOSIS — G9389 Other specified disorders of brain: Secondary | ICD-10-CM | POA: Diagnosis not present

## 2024-11-05 LAB — BASIC METABOLIC PANEL WITH GFR
Anion gap: 11 (ref 5–15)
Anion gap: 13 (ref 5–15)
Anion gap: 12 (ref 5–15)
Anion gap: 13 (ref 5–15)
BUN: 24 mg/dL — ABNORMAL HIGH (ref 8–23)
BUN: 23 mg/dL (ref 8–23)
BUN: 23 mg/dL (ref 8–23)
BUN: 21 mg/dL (ref 8–23)
CO2: 24 mmol/L (ref 22–32)
CO2: 22 mmol/L (ref 22–32)
CO2: 22 mmol/L (ref 22–32)
CO2: 21 mmol/L — ABNORMAL LOW (ref 22–32)
Calcium: 9.1 mg/dL (ref 8.9–10.3)
Calcium: 9 mg/dL (ref 8.9–10.3)
Calcium: 8.7 mg/dL — ABNORMAL LOW (ref 8.9–10.3)
Calcium: 9.2 mg/dL (ref 8.9–10.3)
Chloride: 105 mmol/L (ref 98–111)
Chloride: 104 mmol/L (ref 98–111)
Chloride: 107 mmol/L (ref 98–111)
Chloride: 107 mmol/L (ref 98–111)
Creatinine, Ser: 1.69 mg/dL — ABNORMAL HIGH (ref 0.61–1.24)
Creatinine, Ser: 1.71 mg/dL — ABNORMAL HIGH (ref 0.61–1.24)
Creatinine, Ser: 1.73 mg/dL — ABNORMAL HIGH (ref 0.61–1.24)
Creatinine, Ser: 1.65 mg/dL — ABNORMAL HIGH (ref 0.61–1.24)
GFR, Estimated: 39 mL/min — ABNORMAL LOW
GFR, Estimated: 38 mL/min — ABNORMAL LOW
GFR, Estimated: 38 mL/min — ABNORMAL LOW
GFR, Estimated: 40 mL/min — ABNORMAL LOW
Glucose, Bld: 314 mg/dL — ABNORMAL HIGH (ref 70–99)
Glucose, Bld: 180 mg/dL — ABNORMAL HIGH (ref 70–99)
Glucose, Bld: 193 mg/dL — ABNORMAL HIGH (ref 70–99)
Glucose, Bld: 123 mg/dL — ABNORMAL HIGH (ref 70–99)
Potassium: 4.3 mmol/L (ref 3.5–5.1)
Potassium: 3.7 mmol/L (ref 3.5–5.1)
Potassium: 3.3 mmol/L — ABNORMAL LOW (ref 3.5–5.1)
Potassium: 3.8 mmol/L (ref 3.5–5.1)
Sodium: 140 mmol/L (ref 135–145)
Sodium: 138 mmol/L (ref 135–145)
Sodium: 140 mmol/L (ref 135–145)
Sodium: 141 mmol/L (ref 135–145)

## 2024-11-05 LAB — I-STAT ARTERIAL BLOOD GAS, ED
Acid-base deficit: 3 mmol/L — ABNORMAL HIGH (ref 0.0–2.0)
Bicarbonate: 21.8 mmol/L (ref 20.0–28.0)
Calcium, Ion: 1.28 mmol/L (ref 1.15–1.40)
HCT: 38 % — ABNORMAL LOW (ref 39.0–52.0)
Hemoglobin: 12.9 g/dL — ABNORMAL LOW (ref 13.0–17.0)
O2 Saturation: 89 %
Patient temperature: 97.6
Potassium: 3.9 mmol/L (ref 3.5–5.1)
Sodium: 141 mmol/L (ref 135–145)
TCO2: 23 mmol/L (ref 22–32)
pCO2 arterial: 35.6 mmHg (ref 32–48)
pH, Arterial: 7.393 (ref 7.35–7.45)
pO2, Arterial: 55 mmHg — ABNORMAL LOW (ref 83–108)

## 2024-11-05 LAB — URINALYSIS, W/ REFLEX TO CULTURE (INFECTION SUSPECTED)
Bacteria, UA: NONE SEEN
Bilirubin Urine: NEGATIVE
Glucose, UA: >=500 mg/dL — AB
Ketones, ur: NEGATIVE mg/dL
Leukocytes,Ua: NEGATIVE
Nitrite: NEGATIVE
Protein, ur: 30 mg/dL — AB
Specific Gravity, Urine: 1.025 (ref 1.005–1.030)
pH: 6 (ref 5.0–8.0)

## 2024-11-05 LAB — GLUCOSE, CAPILLARY
Glucose-Capillary: 100 mg/dL — ABNORMAL HIGH (ref 70–99)
Glucose-Capillary: 108 mg/dL — ABNORMAL HIGH (ref 70–99)
Glucose-Capillary: 126 mg/dL — ABNORMAL HIGH (ref 70–99)
Glucose-Capillary: 130 mg/dL — ABNORMAL HIGH (ref 70–99)
Glucose-Capillary: 172 mg/dL — ABNORMAL HIGH (ref 70–99)
Glucose-Capillary: 250 mg/dL — ABNORMAL HIGH (ref 70–99)

## 2024-11-05 LAB — URINE DRUG SCREEN
Amphetamines: NEGATIVE
Barbiturates: NEGATIVE
Benzodiazepines: NEGATIVE
Cocaine: NEGATIVE
Fentanyl: NEGATIVE
Methadone Scn, Ur: NEGATIVE
Opiates: NEGATIVE
Tetrahydrocannabinol: NEGATIVE

## 2024-11-05 LAB — AMMONIA: Ammonia: 25 umol/L (ref 9–35)

## 2024-11-05 LAB — PHOSPHORUS: Phosphorus: 2 mg/dL — ABNORMAL LOW (ref 2.5–4.6)

## 2024-11-05 LAB — CBC
HCT: 39.9 % (ref 39.0–52.0)
HCT: 38.4 % — ABNORMAL LOW (ref 39.0–52.0)
Hemoglobin: 13.3 g/dL (ref 13.0–17.0)
Hemoglobin: 13.1 g/dL (ref 13.0–17.0)
MCH: 31 pg (ref 26.0–34.0)
MCH: 31.5 pg (ref 26.0–34.0)
MCHC: 33.3 g/dL (ref 30.0–36.0)
MCHC: 34.1 g/dL (ref 30.0–36.0)
MCV: 93 fL (ref 80.0–100.0)
MCV: 92.3 fL (ref 80.0–100.0)
Platelets: 200 K/uL (ref 150–400)
Platelets: 208 K/uL (ref 150–400)
RBC: 4.29 MIL/uL (ref 4.22–5.81)
RBC: 4.16 MIL/uL — ABNORMAL LOW (ref 4.22–5.81)
RDW: 13.5 % (ref 11.5–15.5)
RDW: 13.6 % (ref 11.5–15.5)
WBC: 17.3 K/uL — ABNORMAL HIGH (ref 4.0–10.5)
WBC: 18.4 K/uL — ABNORMAL HIGH (ref 4.0–10.5)
nRBC: 0 % (ref 0.0–0.2)
nRBC: 0 % (ref 0.0–0.2)

## 2024-11-05 LAB — CBG MONITORING, ED
Glucose-Capillary: 171 mg/dL — ABNORMAL HIGH (ref 70–99)
Glucose-Capillary: 181 mg/dL — ABNORMAL HIGH (ref 70–99)
Glucose-Capillary: 216 mg/dL — ABNORMAL HIGH (ref 70–99)
Glucose-Capillary: 287 mg/dL — ABNORMAL HIGH (ref 70–99)
Glucose-Capillary: 364 mg/dL — ABNORMAL HIGH (ref 70–99)

## 2024-11-05 LAB — MAGNESIUM: Magnesium: 1.8 mg/dL (ref 1.7–2.4)

## 2024-11-05 LAB — HEMOGLOBIN A1C
Hgb A1c MFr Bld: 12.1 % — ABNORMAL HIGH (ref 4.8–5.6)
Mean Plasma Glucose: 300.57 mg/dL

## 2024-11-05 LAB — TROPONIN T, HIGH SENSITIVITY: Troponin T High Sensitivity: 71 ng/L — ABNORMAL HIGH (ref 0–19)

## 2024-11-05 LAB — MRSA NEXT GEN BY PCR, NASAL: MRSA by PCR Next Gen: NOT DETECTED

## 2024-11-05 MED ORDER — NOREPINEPHRINE 4 MG/250ML-% IV SOLN: 0.0000 ug/min | INTRAVENOUS | Status: DC

## 2024-11-05 MED ADMIN — Insulin Glargine Inj 100 Unit/ML: 5 [IU] | SUBCUTANEOUS | NDC 00088222033

## 2024-11-05 MED ADMIN — Insulin Aspart Inj Soln 100 Unit/ML: 2 [IU] | SUBCUTANEOUS | NDC 73070010011

## 2024-11-05 MED ADMIN — Insulin Aspart Inj Soln 100 Unit/ML: 5 [IU] | SUBCUTANEOUS | NDC 73070010011

## 2024-11-05 MED ADMIN — Insulin Aspart Inj Soln 100 Unit/ML: 3 [IU] | SUBCUTANEOUS | NDC 73070010011

## 2024-11-05 MED ADMIN — CEFTRIAXONE  1 GM IVPB MIXTURE: 1 g | INTRAVENOUS | NDC 00143985701

## 2024-11-05 MED ADMIN — Doxycycline Hyclate Tab 100 MG: 100 mg | ORAL | NDC 24658031001

## 2024-11-05 MED ADMIN — Heparin Sodium (Porcine) Inj 5000 Unit/ML: 5000 [IU] | SUBCUTANEOUS | NDC 72572025501

## 2024-11-05 MED ADMIN — Chlorhexidine Gluconate Pads 2%: 6 | TOPICAL | NDC 53462070523

## 2024-11-05 MED ADMIN — Levetiracetam Inj 500 MG/5ML (100 MG/ML): 500 mg | INTRAVENOUS | NDC 00409188622

## 2024-11-05 MED ADMIN — Levetiracetam Inj 500 MG/5ML (100 MG/ML): 500 mg | INTRAVENOUS | NDC 72572036001

## 2024-11-05 MED ADMIN — Fentanyl Citrate PF Soln Prefilled Syringe 50 MCG/ML: 50 ug | INTRAVENOUS | NDC 63323080801

## 2024-11-05 MED ADMIN — Fentanyl Citrate PF Soln Prefilled Syringe 50 MCG/ML: 12.5 ug | INTRAVENOUS | NDC 63323080801

## 2024-11-05 MED ADMIN — Haloperidol Lactate Inj 5 MG/ML: 2 mg | INTRAVENOUS | NDC 67457042600

## 2024-11-05 MED ADMIN — Fluconazole Tab 150 MG: 150 mg | ORAL | NDC 68462011940

## 2024-11-05 MED FILL — Insulin Glargine Inj 100 Unit/ML: 5.0000 [IU] | SUBCUTANEOUS | Qty: 0.05 | Status: AC

## 2024-11-05 MED FILL — Labetalol HCl IV Soln 5 MG/ML: 2.5000 mg | INTRAVENOUS | Qty: 4 | Status: AC

## 2024-11-05 MED FILL — Heparin Sodium (Porcine) Inj 5000 Unit/ML: 5000.0000 [IU] | INTRAMUSCULAR | Qty: 1 | Status: AC

## 2024-11-05 MED FILL — Doxycycline Hyclate Tab 100 MG: 100.0000 mg | ORAL | Qty: 1 | Status: AC

## 2024-11-05 MED FILL — Fentanyl Citrate PF Soln Prefilled Syringe 50 MCG/ML: 12.5000 ug | INTRAMUSCULAR | Qty: 1 | Status: AC

## 2024-11-05 MED FILL — Fentanyl Citrate PF Soln Prefilled Syringe 50 MCG/ML: 50.0000 ug | INTRAMUSCULAR | Qty: 1 | Status: AC

## 2024-11-05 MED FILL — Fentanyl Citrate PF Soln Prefilled Syringe 50 MCG/ML: 50.0000 ug | INTRAMUSCULAR | Qty: 1 | Status: CN

## 2024-11-05 MED FILL — Haloperidol Lactate Inj 5 MG/ML: 2.0000 mg | INTRAMUSCULAR | Qty: 1 | Status: AC

## 2024-11-05 MED FILL — Insulin Aspart Inj Soln 100 Unit/ML: 0.0000 [IU] | INTRAMUSCULAR | Qty: 1 | Status: AC

## 2024-11-05 MED FILL — Insulin Aspart Inj Soln 100 Unit/ML: 0.0000 [IU] | INTRAMUSCULAR | Qty: 5 | Status: AC

## 2024-11-05 MED FILL — Insulin Aspart Inj Soln 100 Unit/ML: 0.0000 [IU] | INTRAMUSCULAR | Qty: 3 | Status: AC

## 2024-11-05 MED FILL — Insulin Aspart Inj Soln 100 Unit/ML: 0.0000 [IU] | INTRAMUSCULAR | Qty: 8 | Status: AC

## 2024-11-05 MED FILL — Insulin Aspart Inj Soln 100 Unit/ML: 0.0000 [IU] | INTRAMUSCULAR | Qty: 2 | Status: AC

## 2024-11-05 MED FILL — Fluconazole Tab 150 MG: 150.0000 mg | ORAL | Qty: 1 | Status: AC

## 2024-11-05 MED FILL — Ceftriaxone Sodium For Inj 1 GM: 1.0000 g | INTRAMUSCULAR | Qty: 10 | Status: AC

## 2024-11-05 NOTE — Consult Note (Addendum)
 "   Patient ID: [name] [name] MRN: [id number] DOB/AGE: 12-14-1934 89 y.o.  Admit date: 11/04/2024  Admission Diagnoses:  Principal Problem:   Seizure (HCC)   HPI: Ortho consult for right superior and inferior pubic rami fx with extension into anterior column acetabulum.  PMH notable for DM, prostate Ca s/p resection, HTN.  Per chart review, found in bathroom at [facility] with neurologic symptoms and currently being worked up for seizures.  Somnolent and not providing direct history during visit.  Past Medical History: PMH notable for DM, prostate Ca s/p resection, HTN.  Surgical History: None relevant  Family History: None relevant  Social History: Social History   Socioeconomic History   Marital status: Married    Spouse name: Not on file   Number of children: Not on file   Years of education: Not on file   Highest education level: Not on file  Occupational History   Not on file  Tobacco Use   Smoking status: Not on file   Smokeless tobacco: Not on file  Substance and Sexual Activity   Alcohol use: Not on file   Drug use: Not on file   Sexual activity: Not on file  Other Topics Concern   Not on file  Social History Narrative   Not on file   Social Drivers of Health   Tobacco Use: Not on file  Financial Resource Strain: Not on file  Food Insecurity: Not on file  Transportation Needs: Not on file  Physical Activity: Not on file  Stress: Not on file  Social Connections: Not on file  Intimate Partner Violence: Not on file  Depression (EYV7-0): Not on file  Alcohol Screen: Not on file  Housing: Not on file  Utilities: Not on file  Health Literacy: Not on file    Allergies: Patient has no known allergies.  Medications: I have reviewed the patient's current medications.  Vital Signs: Patient Vitals for the past 24 hrs:  BP Temp Temp src Pulse Resp SpO2 Weight  11/05/24 0700 139/67 -- -- (!) 107 14 96 % --  11/05/24 0640 (!) 143/78 100 F  (37.8 C) Axillary (!) 110 (!) 32 94 % --  11/05/24 0515 (!) 140/77 -- -- (!) 102 (!) 24 97 % --  11/05/24 0428 -- 100 F (37.8 C) Axillary -- -- -- --  11/05/24 0415 (!) 162/89 -- -- (!) 127 (!) 22 97 % --  11/05/24 0345 (!) 180/91 -- -- (!) 130 (!) 29 90 % --  11/05/24 0330 (!) 161/85 -- -- (!) 151 (!) 30 (!) 87 % --  11/05/24 0300 (!) 194/93 -- -- (!) 112 15 98 % --  11/05/24 0245 (!) 189/79 -- -- (!) 113 (!) 30 97 % --  11/05/24 0130 (!) 173/73 -- -- (!) 116 (!) 27 96 % --  11/05/24 0115 (!) 142/90 -- -- (!) 114 (!) 23 100 % --  11/05/24 0100 (!) 175/84 -- -- (!) 117 (!) 34 97 % --  11/05/24 0000 (!) 176/89 -- -- 89 19 95 % --  11/04/24 2300 -- 97.6 F (36.4 C) Temporal -- -- -- --  11/04/24 2245 (!) 162/63 -- -- (!) 120 (!) 29 100 % --  11/04/24 2000 -- 98.4 F (36.9 C) Rectal (!) 104 -- 94 % --  11/04/24 1930 (!) 169/84 -- -- 85 20 100 % --  11/04/24 1915 (!) 140/85 -- -- 96 (!) 25 97 % --  11/04/24 1900 (!) 122/104 -- -- 95 ROLLEN)  24 97 % --  11/04/24 1840 (!) 159/78 -- -- -- -- -- --  11/04/24 1827 (!) 225/115 -- -- (!) 118 -- -- 78.2 kg    Radiology: Overnight EEG with video Result Date: 11/05/2024 Shelton Arlin KIDD, MD     11/05/2024  7:52 AM Patient Name: [name] [name] MRN: [id number] Epilepsy Attending: Arlin KIDD Shelton Referring Physician/Provider: Remi Pippin, NP Duration: 11/04/2024 2045 to 11/05/2024 0730 Patient history: unknown age and name male with no known PMHx except for diabetes (unidentified at time of presentation). He was found down at [facility] in the bathroom. Staff think he was in there for 15 minutes. Found with right gaze, aphasia, right facial froop, right sided weakness, and right neglect. EEG to evaluate for seizure Level of alertness: Awake, asleep AEDs during EEG study: LEV Technical aspects: This EEG study was done with scalp electrodes positioned according to the 10-20 International system of electrode placement. Electrical activity was reviewed with  band pass filter of 1-70Hz , sensitivity of 7 uV/mm, display speed of 22mm/sec with a 60Hz  notched filter applied as appropriate. EEG data were recorded continuously and digitally stored.  Video monitoring was available and reviewed as appropriate. Description: The posterior dominant rhythm consists of 8-9Hz  activity of moderate voltage (25-35 uV) seen predominantly in posterior head regions, symmetric and reactive to eye opening and eye closing. Sleep was characterized by vertex waves, sleep spindles (12 to 14 Hz), maximal frontocentral region.  There is continuous 3 to 6 Hz theta-delta slowing in left fronto-temporal region. Hyperventilation and photic stimulation were not performed.   ABNORMALITY - Continuous slow, left fronto-temporal region IMPRESSION: This study is suggestive of cortical dysfunction arising from  left fronto-temporal region likely secondary to underlying structural abnormality, post-ictal state. No seizures or epileptiform discharges were seen throughout the recording. Priyanka KIDD Shelton   CT CHEST ABDOMEN PELVIS WO CONTRAST Result Date: 11/05/2024 EXAM: CT CHEST, ABDOMEN AND PELVIS WITHOUT CONTRAST 11/05/2024 06:04:30 AM TECHNIQUE: CT of the chest, abdomen and pelvis was performed without the administration of intravenous contrast. Multiplanar reformatted images are provided for review. Automated exposure control, iterative reconstruction, and/or weight based adjustment of the mA/kV was utilized to reduce the radiation dose to as low as reasonably achievable. COMPARISON: CTA head and neck dated 11/04/2024. CLINICAL HISTORY: Polytrauma, blunt; found down, AMS, AP. Exam is for trauma and sepsis screening. He had vascular stenoses on CTA head and neck yesterday, but the exam was negative for acute ischemia in the brain. FINDINGS: LIMITATIONS/ARTIFACTS: There is abundant respiratory motion on exam as well as body motion, limiting the study. CHEST: MEDIASTINUM AND LYMPH NODES: Heart and  pericardium are unremarkable. The cardiac size is normal. There are patchy 3-vessel coronary calcifications. There is atherosclerosis in the aorta and great vessels but no aortic aneurysm. The pulmonary arteries and veins are normal in caliber. The left lobe of the thyroid gland has either been removed or is atrophic. The right lobe is unremarkable. There is a mildly patulous esophagus with retained versus refluxed fluid in the upper thoracic esophagus. Consider aspiration precautions unless already being done. No mediastinal, hilar or axillary lymphadenopathy. LUNGS AND PLEURA: Diffuse bronchial thickening is seen. Central airways appear to be grossly clear, but there are scattered posterior basal subsegmental bronchial impactions visible in the right lower lobe if not also a few in the left lower lobe. There is asymmetric posterior basal right lower lobe opacity, which is probably due to bronchopneumonia given the bronchial impactions in the area. Although fine detail is  very limited, there are also suspected to be scattered ill-defined ground glass opacities in both lower lobes above this level consistent with additional pneumonia. No other focal consolidations are seen. There is asymmetric pleuroparenchymal scarring in the right apex. Trace pleural effusions. No pneumothorax. ABDOMEN AND PELVIS: LIVER: There is loss of fine detail in the liver parenchyma with no obvious mass. GALLBLADDER AND BILE DUCTS: There is vicariously excreted contrast in the gallbladder. No filling defects, wall thickening, or biliary dilatation. SPLEEN: No acute abnormality. PANCREAS: No acute abnormality. ADRENAL GLANDS: There is slight nodular thickening of both adrenal glands. KIDNEYS, URETERS AND BLADDER: Right kidney is severely atrophic. The right renal artery and multiple of its hilar branches appear to have been embolized. There is branching opaque material extending through the right renal artery to the hilar branches with  severe chronic right renal atrophy. There is a slightly heterogeneous rounded right parapelvic solid mass measuring 3.5 x 3 cm and 43 Hounsfield units on series 3 axial 62 worrisome for neoplasm. Consider follow-up with MRI versus CT without contrast. MRI may be compromised by susceptibility artifact. There is scarring and volume loss in the inferior pole of the left kidney but no contour deforming mass is suspected . There is contrast in the left renal collecting system and in the normal caliber ureter. The contrast would obscure stones if present. No obstructive uropathy is seen. . There is thickening and trabeculation of the bladder wall. Bilateral diverticula arise from the posterolateral bladder wall from a narrow opening on both sides. Etiology could be chronic infection or chronic bladder outlet obstruction. GI AND BOWEL: Stomach demonstrates no acute abnormality. No bowel obstruction or inflammation is seen through the motion artifacts. REPRODUCTIVE ORGANS: There has been a prior radical prostatectomy. No masses seen in the prostate bed. PERITONEUM AND RETROPERITONEUM: No ascites. No free air. VASCULATURE: Aorta is normal in caliber with heavy aortoiliac calcific plaques . . . ABDOMINAL AND PELVIS LYMPH NODES: No lymphadenopathy. BONES AND SOFT TISSUES: There is bilateral subareolar gynecomastia. No acute chest wall findings. No obvious thoracic skeletal acute fracture. There is asymmetric sclerosis in the right side of the C7 vertebral body extending into the pedicle and articular pillar concerning for metastatic disease, although no other levels show focal lesions and mono-ostotic Paget's disease could also have this appearance. There is mild chronic upper plate anterior wedging of the T7 vertebral body. The T12 ribs are hypoplastic. SABRA There is a chronic moderate anterior wedge compression fracture of the T12 body with bridging osteophytes to T11 and slight retropulsion. There is a recent, probably acute  comminuted intraarticular fracture of the anterior column of the right acetabulum extending into the proximal right superior pubic ramus, and a minimally displaced fracture of the mid portion of the right inferior pubic ramus. There is at least a small right hip hemarthrosis associated. There is mild stranding along the right anterior pelvic sidewall adjacent to the acetabular fracture but no pelvic free hemorrhage. There is no other definitive acute regional skeletal fracture. Prominent There are only 4 lumbar-type segments in this patient. There are mild to moderate compression fractures at L2 and L4 and a mild upper plate anterior wedge compression fracture of L3, which all have a grossly chronic appearance. There are degenerative changes and osteopenia. Spurring of the symphysis pubis. No suspicious regional bone lesion. There are small inguinal fat hernias. IMPRESSION: 1. Acute comminuted intraarticular fracture of the anterior column of the right acetabulum extending into the proximal right superior pubic ramus,  and a minimally displaced fracture of the mid portion of the right inferior pubic ramus, with associated small right hip hemarthrosis and mild stranding along the right anterior pelvic sidewall, without pelvic free hemorrhage. 2. Asymmetric posterior basal right lower lobe opacity likely due to bronchopneumonia, with additional suspected scattered ill-defined ground glass opacities in both lower lobes consistent with pneumonia. 3. Slightly heterogeneous rounded right parapelvic solid renal mass measuring 3.5 x 3.0 cm and 43 Hounsfield units, worrisome for neoplasm, with further evaluation recommended with renal protocol MRI or CT without and with contrast. Background Severe right renal atrophy with apparent renal artery embolization. 4. Asymmetric sclerosis involving the right C7 vertebral body extending into the pedicle and articular pillar, indeterminate with metastatic disease not excluded, although  Paget disease could have a similar appearance, with further evaluation recommended. . 5. Aortic and coronary atherosclerosis. SABRA 6. Thoracic and lumbar spine compression fractures with a chronic appearance . 7. Bladder trabeculation and diverticula , which could be due to chronic bladder outlet obstruction or chronic infection. Electronically signed by: Francis Quam MD 11/05/2024 07:07 AM EST RP Workstation: [id number]   DG Chest 1 View Result Date: 11/04/2024 EXAM: 1 VIEW(S) XRAY OF THE CHEST 11/04/2024 07:06:00 PM COMPARISON: None available. CLINICAL HISTORY: cough/found down FINDINGS: LUNGS AND PLEURA: Low lung volumes. Patchy airspace opacities at left lung base. Question right apical nodule-like density versus overlapping osseous structures. No pleural effusion. No pneumothorax. HEART AND MEDIASTINUM: Aortic atherosclerosis. No acute abnormality of the cardiac and mediastinal silhouettes. BONES AND SOFT TISSUES: No acute osseous abnormality. IMPRESSION: 1. Low lung volumes with patchy left basilar airspace opacities, which may reflect atelectasis or pneumonia. Recommend repeat PA and lateral view of the chest with improved inspiratory effort to further evaluate. 2. Question right apical nodule-like density versus overlapping osseous structures. Finding can also be further evaluated on repeat chest x-ray. Electronically signed by: Morgane Naveau MD 11/04/2024 08:57 PM EST RP Workstation: [id number]   CT ANGIO HEAD NECK W WO CM W PERF (CODE STROKE) Result Date: 11/04/2024 EXAM: CT ANGIOGRAPHY OF THE HEAD AND NECK CT PERFUSION BRAIN 11/04/2024 06:45:49 PM TECHNIQUE: Contiguous axial images were obtained from the base of the skull through the vertex without intravenous contrast. Multidetector CT imaging of the head and neck was performed using the standard protocol during bolus administration of 100 mL of iohexol  (OMNIPAQUE ) 350 MG/ML injection. 3D postprocessing with multiplanar reconstructions and MIPs  was performed to evaluate the vascular anatomy. Carotid stenosis measurements (when applicable) are obtained utilizing NASCET criteria, using the distal internal carotid diameter as the denominator. Cerebral perfusion analysis using computed tomography with contrast administration, including post-processing of parametric maps with determination of cerebral blood flow, cerebral blood volume, mean transit time and time-to-maximum. RADIATION DOSE REDUCTION: This exam was performed according to the departmental dose-optimization program which includes automated exposure control, adjustment of the mA and/or kV according to patient size and/or use of iterative reconstruction technique. CONTRAST: Without and with IV contrast. 100 mL of iohexol  (OMNIPAQUE ) 350 MG/ML injection. COMPARISON: CT head dated 11/04/2024. CLINICAL HISTORY: Neuro deficit, acute, stroke suspected. FINDINGS: CT HEAD: Question prominence of the bilateral optic nerve sheath complexes; recommend correlation with fundoscopic exam findings. CTA NECK: AORTIC ARCH AND ARCH VESSELS: Moderate atherosclerosis of the visualized aortic arch. 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 Atherosclerosis at the origin of the aberrant right subclavian resulting in focal mild stenosis. Moderate stenosis at the origin of the left subclavian artery. No dissection or arterial injury. CERVICAL  CAROTID ARTERIES: Mixed atherosclerotic plaque at the right carotid bifurcation resulting in 85% stenosis of the proximal right cervical ICA. Additional moderate stenosis at the origin of the right external carotid artery. Mixed atherosclerotic plaque at the left carotid bifurcation which results in approximately 60% stenosis at the origin of the left cervical ICA. No dissection or arterial injury. CERVICAL VERTEBRAL ARTERIES: Right vertebral artery origin is obscured due to streak artifact from dense venous contrast. The right vertebral artery is dominant.  Atherosclerosis of the origin of the nondominant left vertebral artery resulting in focal severe stenosis. No dissection or arterial injury. LUNGS AND MEDIASTINUM: Opacities within the medial aspect of the right upper lobe. There is bronchial wall thickening within the right upper lobe which may be related to infection. SOFT TISSUES: Hypoplastic appearance of the thyroid with nonvisualization of the left thyroid lobe. BONES: Degenerative changes in the visualized spine. There is 5 mm anterolisthesis of C3 on C4 likely related to chronic facet degenerative changes. Dental caries. Sclerosis in the right aspect of the C7 vertebral body and the right pedicle of C7 which could reflect an osseous lesion; recommend correlation with MRI of the cervical spine. CTA HEAD: ANTERIOR CIRCULATION: Atherosclerosis of the bilateral carotid siphons. There is moderate stenosis of the bilateral cavernous and paraophthalmic ICAs. There is a 2 mm inferiorly directed outpouching along the left supraclinoid ICA which may reflect a small aneurysm versus infundibulum at the origin of the posterior communicating artery. A1 segment of the right ACA likely congenital. No significant stenosis of the anterior cerebral arteries. No significant stenosis of the middle cerebral arteries. POSTERIOR CIRCULATION: There is a 2 mm inferiorly directed outpouching at the P1 and P2 junction of the left PCA concerning for small aneurysm (series 5, image 134). No significant stenosis of the posterior cerebral arteries. No significant stenosis of the basilar artery. No significant stenosis of the vertebral arteries. OTHER: No dural venous sinus thrombosis on this non-dedicated study. CT PERFUSION: EXAM QUALITY: Exam quality is adequate with diagnostic perfusion maps. No significant motion artifact. Appropriate arterial inflow and venous outflow curves. CORE INFARCT (CBF<30% volume): 0 mL TOTAL HYPOPERFUSION (Tmax>6s volume): 3 mL PENUMBRA: Mismatch volume: 3 mL  Mismatch ratio: not applicable Location: not applicable IMPRESSION: 1. No large vessel occlusion. 2. No evidence of ischemia by CT brain perfusion. 3. Mixed atherosclerotic plaque at the right carotid bifurcation with approximately 85% stenosis at the origin of the right cervical ICA. 4. Mixed atherosclerotic plaque at the left carotid bifurcation with approximately 60% stenosis at the origin of the left cervical ICA. 5. Severe stenosis at the origin of the nondominant left vertebral artery. 6. Atherosclerosis of the bilateral carotid siphons with moderate stenosis of the bilateral cavernous and paraophthalmic ICAs. 7. 2 mm inferiorly directed outpouching along the left supraclinoid ICA, possibly a small aneurysm versus infundibulum at the origin of the posterior communicating artery. 8. Additional 2 mm inferiorly directed outpouching at the P1 and P2 junction of the left PCA, concerning for a small aneurysm. 9. Question prominence of the bilateral optic nerve sheath complexes. Recommend correlation with fundoscopic exam findings. 10. Sclerosis in the right aspect of the C7 vertebral body and right pedicle, which could reflect an osseous lesion. Recommend MRI of the cervical spine with and without contrast. 11. Opacities and bronchial wall thickening in the medial right upper lobe, which may be related to infection. Electronically signed by: Donnice Mania MD 11/04/2024 07:21 PM EST RP Workstation: [id number]   CT HEAD CODE STROKE WO  CONTRAST Result Date: 11/04/2024 EXAM: CT HEAD WITHOUT CONTRAST 11/04/2024 06:32:10 PM TECHNIQUE: CT of the head was performed without the administration of intravenous contrast. Automated exposure control, iterative reconstruction, and/or weight based adjustment of the mA/kV was utilized to reduce the radiation dose to as low as reasonably achievable. COMPARISON: None available. CLINICAL HISTORY: Neuro deficit, acute, stroke suspected. FINDINGS: BRAIN AND VENTRICLES: No acute  hemorrhage. No evidence of acute infarct. Mild to moderate chronic microvascular ischemic changes and generalized parenchymal volume loss. Skull base atherosclerosis involving the carotid siphons and intracranial vertebral arteries. No hydrocephalus. No extra-axial collection. No mass effect or midline shift. [place] Stroke Program Early CT (ASPECT) Score: Ganglionic (caudate, internal capsule, lentiform nucleus, insula, M1-M3): 7 Supraganglionic (M4-M6): 3 Total: 10 ORBITS: Bilateral lens replacement. SINUSES: No acute abnormality. SOFT TISSUES AND SKULL: No acute soft tissue abnormality. No skull fracture. Chronic bilateral nasal bone deformities. IMPRESSION: 1. No acute intracranial abnormality. 2. ASPECTS 10. 3. Findings messaged to Dr. Lindzen at 6:39 PM on 11/04/24. Electronically signed by: Donnice Mania MD 11/04/2024 06:41 PM EST RP Workstation: [id number]    Labs: Recent Labs    11/05/24 0315 11/05/24 0505  WBC 17.3* 18.4*  RBC 4.29 4.16*  HCT 39.9 38.4*  PLT 200 208   Recent Labs    11/05/24 0315 11/05/24 0505  NA 140 138  K 4.3 3.7  CL 105 104  CO2 24 22  BUN 24* 23  CREATININE 1.69* 1.71*  GLUCOSE 314* 180*  CALCIUM 9.1 9.0   Recent Labs    11/04/24 1825  INR 1.0    Review of Systems: ROS as detailed in HPI  Physical Exam: There is no height or weight on file to calculate BMI.  Physical Exam   Gen: Somnolent, NAD Restrained  Bilateral Lower Extremity: Skin intact Gross motor & SILT throughout DP, PT 2+ to palp CR < 2s   Assessment and Plan: Ortho consult for right superior and inferior pubic rami fx with extension into anterior column acetabulum, DOI 11/04/24  -history, exam and imaging reviewed at length -no acute surgical intervention -discussed with nursing - PRAFO offloading wedge soft boots  -TDWB RLE with walker and 1-2 person assist at all times -follow up as outpatient for weightbearing pelvis films  -PT/OT when able to participate from  Neuro standpoint  Lillia Mountain, MD Orthopaedic Surgeon [facility] [phone] "

## 2024-11-05 NOTE — Progress Notes (Signed)
 SLP Cancellation Note  Patient Details Name: Zachari [place] MRN: [id number] DOB: Feb 10, 1935   Cancelled treatment:       Reason Eval/Treat Not Completed: Patient's level of consciousness Patient in bed, agitated, screaming out, not able to follow commands. SLP will follow for readiness for swallow evaluation. Recommend continue NPO.  Norleen IVAR Blase, MA, CCC-SLP Speech Therapy  11/05/2024, 2:08 PM

## 2024-11-05 NOTE — Progress Notes (Signed)
 LTM EEG disconnected - no skin breakdown at [facility]. [facility] notified.

## 2024-11-05 NOTE — Progress Notes (Signed)
 More agitated. Disoriented. Shouting. Haldol  PRN ordered.

## 2024-11-05 NOTE — Consult Note (Addendum)
 "  NAME:  [name] [name], MRN:  [id number], DOB:  [birth year], LOS: 0 ADMISSION DATE:  11/04/2024, CONSULTATION DATE:  11/05/2024 REFERRING MD:  EDP, CHIEF COMPLAINT:  AMS   History of Present Illness:  89 year old male with past medical history significant for DM, prostate Ca s/p resection, HTN who went to [facility] independently and was found in the bathroom with R gaze deviation, aphasia and R facial droop with R sided weakness and neglect.  Staff think he was in the bathroom approximately 15 minutes.  On EMS arrival he was hypertensive with a systolic blood pressure greater than 220 and hyperglycemic with right sided neglect.  He was taken to the emergency department and initially was open all extremities but not following commands.  He was taken to head CT which did not show any acute abnormalities.  There was concern for seizure like activity with some shaking in the CT scanner, so he was placed on EEG.  Labs were significant for glucose over 600, creatinine 2.0, and a white count of 17.  He had some agitation and required Ativan  and then was obtunded.  PCCM consulted in the setting  Pertinent  Medical History   Diabetes, hypertension, prostate cancer  Significant Hospital Events: Including procedures, antibiotic start and stop dates in addition to other pertinent events   12/27 admit with stroke versus seizure and agitation  Interim History / Subjective:  Patient is agitated.  In restraints and Posey belt.  He is communicative.  Says difficult to pee for 3 days.  Pain on his right upper leg back.  Informed patient of the fractures in the pelvis.  Unable to retract foreskin of penis.  Scrotum and distal penis and foreskin appear consistent with candidal infection.   Objective    Blood pressure 139/67, pulse (!) 107, temperature 100 F (37.8 C), temperature source Axillary, resp. rate 14, weight 78.2 kg, SpO2 96%.        Intake/Output Summary (Last 24 hours) at 11/05/2024 0912 Last data  filed at 11/05/2024 0409 Gross per 24 hour  Intake 774.64 ml  Output --  Net 774.64 ml   Filed Weights   11/04/24 1827  Weight: 78.2 kg    General: Agitated, restrained HEENT: EOMI pupils responsive Neuro: Moves all extremities, reports pain right upper leg, radiating lower back  CV: Regular rate and rhythm PULM: Normal work of breathing, on nasal cannula supplementation GU: Scrotum erythematous, tip of penis erythematous.  Uncircumcised.  Unable to retract foreskin    Resolved problem list   Assessment and Plan   Right sided neglect, left gaze deviation: Feels related to seizure, has ICA stenosis but it is on the right side. -- Improved  Metabolic encephalopathy: Related to hypoglycemia, postictal state.  Improved. -- Try to avoid centrally acting medications, difficult with agitation from fractures  Seizures: -- Appreciate neurology support, AEDs per neurology, currently on Keppra  -- Continue EEG  Right ICA stenosis: -- Per neurology note, vascular surgery has been consulted  Pelvic fractures: Presumably from fall -- Orthopedics assistance appreciated -- With preceding encephalopathy, start with low-dose fentanyl  for pain, can add oral agents once able to take p.o.  Presumed candidal balanitis and phimosis, scrotal skin infection: -- With phimosis, appreciate urology asisstance --fluconazole  150 mg x 1, s/p CTX (gonorrhea), doxycycline  100 mg BID x 7 days (possible chlamydia)  Diabetes with HHS: Encephalopathy improved with improvement in blood sugars. -- Transitioned off insulin  drip, continue subcutaneous insulin   AKI: Creatinine improving with fluids. -- Continue maintenance fluids  throughout the day, encourage p.o. intake when more able to do so  Pna: infiltrates on CT --CTX, and doxy for CAP coverage   Labs   CBC: Recent Labs  Lab 11/04/24 1825 11/04/24 1827 11/05/24 0106 11/05/24 0315 11/05/24 0505  WBC 12.1*  --   --  17.3* 18.4*  NEUTROABS  7.0  --   --   --   --   HGB 15.2 16.0 12.9* 13.3 13.1  HCT 44.9 47.0 38.0* 39.9 38.4*  MCV 91.4  --   --  93.0 92.3  PLT 271  --   --  200 208    Basic Metabolic Panel: Recent Labs  Lab 11/04/24 1825 11/04/24 1827 11/05/24 0106 11/05/24 0315 11/05/24 0505  NA 133* 135 141 140 138  K 4.4 4.3 3.9 4.3 3.7  CL 96* 98  --  105 104  CO2 25  --   --  24 22  GLUCOSE 603* 580*  --  314* 180*  BUN 29* 33*  --  24* 23  CREATININE 2.00* 1.90*  --  1.69* 1.71*  CALCIUM 9.5  --   --  9.1 9.0  MG  --   --   --   --  1.8  PHOS  --   --   --   --  2.0*   GFR: CrCl cannot be calculated (Unknown ideal weight.). Recent Labs  Lab 11/04/24 1825 11/05/24 0315 11/05/24 0505  WBC 12.1* 17.3* 18.4*    Liver Function Tests: Recent Labs  Lab 11/04/24 1825  AST 51*  ALT 39  ALKPHOS 170*  BILITOT 0.5  PROT 7.0  ALBUMIN 4.3   No results for input(s): LIPASE, AMYLASE in the last 168 hours. Recent Labs  Lab 11/05/24 0511  AMMONIA 25    ABG    Component Value Date/Time   PHART 7.393 11/05/2024 0106   PCO2ART 35.6 11/05/2024 0106   PO2ART 55 (L) 11/05/2024 0106   HCO3 21.8 11/05/2024 0106   TCO2 23 11/05/2024 0106   ACIDBASEDEF 3.0 (H) 11/05/2024 0106   O2SAT 89 11/05/2024 0106     Coagulation Profile: Recent Labs  Lab 11/04/24 1825  INR 1.0    Cardiac Enzymes: No results for input(s): CKTOTAL, CKMB, CKMBINDEX, TROPONINI in the last 168 hours.  HbA1C: No results found for: HGBA1C  CBG: Recent Labs  Lab 11/05/24 0231 11/05/24 0352 11/05/24 0500 11/05/24 0631 11/05/24 0802  GLUCAP 216* 364* 181* 172* 250*    Review of Systems:   Unable to obtain   Past Medical History:  He,  has no past medical history on file.   Surgical History:     Social History:      Family History:  His family history is not on file.   Allergies Allergies[1]   Home Medications  Prior to Admission medications  Medication Sig Start Date End Date Taking?  Authorizing Provider  atorvastatin (LIPITOR) 40 MG tablet Take 40 mg by mouth daily. 10/29/24  Yes [provider]  ferrous sulfate 325 (65 FE) MG tablet Take 325 mg by mouth every other day.   Yes [provider]  JARDIANCE 25 MG TABS tablet Take 25 mg by mouth daily. 08/16/24  Yes [provider]  venlafaxine XR (EFFEXOR-XR) 150 MG 24 hr capsule Take 150 mg by mouth daily. 07/26/24  Yes [provider]     Critical care time:       CRITICAL CARE Performed by: Donnice SAUNDERS Katelynn Heidler   Total critical  care time: 35 minutes  Critical care time was exclusive of separately billable procedures and treating other patients.  Critical care was necessary to treat or prevent imminent or life-threatening deterioration.  Critical care was time spent personally by me on the following activities: development of treatment plan with patient and/or surrogate as well as nursing, discussions with consultants, evaluation of patient's response to treatment, examination of patient, obtaining history from patient or surrogate, ordering and performing treatments and interventions, ordering and review of laboratory studies, ordering and review of radiographic studies, pulse oximetry and re-evaluation of patient's condition.   Donnice JONELLE Beals, MD [facility] See Amion for contact info If no response to pager, please call [phone] until 7pm After 7:00 pm call Elink  [phone]       [1] No Known Allergies  "

## 2024-11-05 NOTE — ED Provider Notes (Signed)
 Notified by nursing that pt appears to be in pain.  Plan to admit to ICU for ongoing workup.  He was ordered a one-time dose of pain medication.  He appears to be holding his abdomen.  Will add on a CT chest abdomen/pelvis for additional evaluation on way to the ICU.   Griselda Norris, MD 11/05/24 [phone]

## 2024-11-05 NOTE — Procedures (Addendum)
 Patient Name: [name] [name]  MRN: [id number]  Epilepsy Attending: Arlin MALVA Krebs  Referring Physician/Provider: Remi Pippin, NP  Duration: 11/04/2024 2045 to 11/05/2024 9075  Patient history: unknown age and name male with no known PMHx except for diabetes (unidentified at time of presentation). He was found down at [facility] in the bathroom. Staff think he was in there for 15 minutes. Found with right gaze, aphasia, right facial froop, right sided weakness, and right neglect. EEG to evaluate for seizure  Level of alertness: Awake, asleep  AEDs during EEG study: LEV  Technical aspects: This EEG study was done with scalp electrodes positioned according to the 10-20 International system of electrode placement. Electrical activity was reviewed with band pass filter of 1-70Hz , sensitivity of 7 uV/mm, display speed of 18mm/sec with a 60Hz  notched filter applied as appropriate. EEG data were recorded continuously and digitally stored.  Video monitoring was available and reviewed as appropriate.  Description: The posterior dominant rhythm consists of 8-9Hz  activity of moderate voltage (25-35 uV) seen predominantly in posterior head regions, symmetric and reactive to eye opening and eye closing. Sleep was characterized by vertex waves, sleep spindles (12 to 14 Hz), maximal frontocentral region.  There is continuous 3 to 6 Hz theta-delta slowing in left fronto-temporal region. Hyperventilation and photic stimulation were not performed.     ABNORMALITY - Continuous slow, left fronto-temporal region  IMPRESSION: This study is suggestive of cortical dysfunction arising from  left fronto-temporal region likely secondary to underlying structural abnormality, post-ictal state. No seizures or epileptiform discharges were seen throughout the recording.  Lashina Milles O Olvin Rohr

## 2024-11-05 NOTE — Consult Note (Signed)
 "  NAME:  [name] [name], MRN:  [id number], DOB:  1935/07/02, LOS: 0 ADMISSION DATE:  11/04/2024, CONSULTATION DATE:  11/05/2024 REFERRING MD:  EDP, CHIEF COMPLAINT:  AMS   History of Present Illness:  89 year old male with past medical history significant for DM, prostate Ca s/p resection, HTN who went to [facility] independently and was found in the bathroom with R gaze deviation, aphasia and R facial droop with R sided weakness and neglect.  Staff think he was in the bathroom approximately 15 minutes.  On EMS arrival he was hypertensive with a systolic blood pressure greater than 220 and hyperglycemic with right sided neglect.  He was taken to the emergency department and initially was open all extremities but not following commands.  He was taken to head CT which did not show any acute abnormalities.  There was concern for seizure like activity with some shaking in the CT scanner, so he was placed on EEG.  Labs were significant for glucose over 600, creatinine 2.0, and a white count of 17.  He had some agitation and required Ativan  and then was obtunded.  PCCM consulted in the setting  Pertinent  Medical History   Diabetes, hypertension, prostate cancer  Significant Hospital Events: Including procedures, antibiotic start and stop dates in addition to other pertinent events   12/27 admit with stroke versus seizure and agitation  Interim History / Subjective:  Patient more awake throughout ED course  Objective    Blood pressure (!) 162/63, pulse (!) 120, temperature 97.6 F (36.4 C), temperature source Temporal, resp. rate (!) 29, weight 78.2 kg, SpO2 100%.        Intake/Output Summary (Last 24 hours) at 11/05/2024 0020 Last data filed at 11/05/2024 0009 Gross per 24 hour  Intake 266.65 ml  Output --  Net 266.65 ml   Filed Weights   11/04/24 1827  Weight: 78.2 kg    General: Well-nourished elderly male, resting in bed in no acute distress HEENT: MM pink/moist, no gaze deviation,  pupils equal and responsive to light Neuro: Examined after receiving Ativan , grimaces to pain, does not open eyes, pupils equal round and reactive, protecting his airway.  Later in ED course PE patient waking up CV: s1s2 RRR, no m/r/g PULM: Clear bilaterally on room air without rhonchi or wheezing GI: soft, bsx4 active  Extremities: warm/dry, no edema     Resolved problem list   Assessment and Plan   Acute right sided neglect and left gaze deviation with metabolic encephalopathy Concern for CT head versus seizure activity Severe ICA stenosis on CT - Initial head CT without evidence of stroke - Management per neurology, getting LTM EEG and received Keppra   -MRI brain and C-spine - SBP goal 140-180 given stenosis - Echo - Patient currently somewhat sedated after Ativan , admit to ICU for close monitoring.  May need Precedex, currently protecting his airway and is not hypercarbic on ABG - Neurology recommends consulting vascular surgery for elective CEA - Continue statin and ASA  Diabetes with hyperglycemia Hyperosmolar state without anion gap -Continue insulin  drip per Endo tool - Serial BMP - Will need diabetes education - Hemoglobin A1c -transition to SSI when appropriate    Labs   CBC: Recent Labs  Lab 11/04/24 1825 11/04/24 1827  WBC 12.1*  --   NEUTROABS 7.0  --   HGB 15.2 16.0  HCT 44.9 47.0  MCV 91.4  --   PLT 271  --     Basic Metabolic Panel: Recent Labs  Lab  11/04/24 1825 11/04/24 1827  NA 133* 135  K 4.4 4.3  CL 96* 98  CO2 25  --   GLUCOSE 603* 580*  BUN 29* 33*  CREATININE 2.00* 1.90*  CALCIUM 9.5  --    GFR: CrCl cannot be calculated (Unknown ideal weight.). Recent Labs  Lab 11/04/24 1825  WBC 12.1*    Liver Function Tests: Recent Labs  Lab 11/04/24 1825  AST 51*  ALT 39  ALKPHOS 170*  BILITOT 0.5  PROT 7.0  ALBUMIN 4.3   No results for input(s): LIPASE, AMYLASE in the last 168 hours. No results for input(s): AMMONIA  in the last 168 hours.  ABG    Component Value Date/Time   TCO2 27 11/04/2024 1827     Coagulation Profile: Recent Labs  Lab 11/04/24 1825  INR 1.0    Cardiac Enzymes: No results for input(s): CKTOTAL, CKMB, CKMBINDEX, TROPONINI in the last 168 hours.  HbA1C: No results found for: HGBA1C  CBG: Recent Labs  Lab 11/04/24 1822 11/04/24 1945 11/04/24 2212 11/04/24 2324 11/05/24 0006  GLUCAP 550* 518* 541* 472* 287*    Review of Systems:   Unable to obtain   Past Medical History:  He,  has no past medical history on file.   Surgical History:     Social History:      Family History:  His family history is not on file.   Allergies Allergies[1]   Home Medications  Prior to Admission medications  Medication Sig Start Date End Date Taking? Authorizing Provider  atorvastatin (LIPITOR) 40 MG tablet Take 40 mg by mouth daily. 10/29/24  Yes [provider]  ferrous sulfate 325 (65 FE) MG tablet Take 325 mg by mouth every other day.   Yes [provider]  JARDIANCE 25 MG TABS tablet Take 25 mg by mouth daily. 08/16/24  Yes [provider]  venlafaxine XR (EFFEXOR-XR) 150 MG 24 hr capsule Take 150 mg by mouth daily. 07/26/24  Yes [provider]     Critical care time:  35 minutes       CRITICAL CARE Performed by: Leita SAUNDERS Izumi Mixon   Total critical care time: 50 minutes  Critical care time was exclusive of separately billable procedures and treating other patients.  Critical care was necessary to treat or prevent imminent or life-threatening deterioration.  Critical care was time spent personally by me on the following activities: development of treatment plan with patient and/or surrogate as well as nursing, discussions with consultants, evaluation of patient's response to treatment, examination of patient, obtaining history from patient or surrogate, ordering and performing treatments and interventions, ordering  and review of laboratory studies, ordering and review of radiographic studies, pulse oximetry and re-evaluation of patient's condition.   Leita SAUNDERS Keera Altidor, PA-C [facility] See Amion for pager If no response to pager , please call [phone] until 7pm After 7:00 pm call Elink  [phone]      [1] No Known Allergies  "

## 2024-11-05 NOTE — Progress Notes (Signed)
 LTM maint complete - no skin breakdown under A1. EEG equipment was transferred with patient overnight from ED to 4N. [facility] now monitoring.

## 2024-11-05 NOTE — ED Notes (Signed)
 Pt moaning, grimacing, and rubbing head. RN notified PA for prn pain medication

## 2024-11-05 NOTE — Evaluation (Signed)
 Clinical/Bedside Swallow Evaluation Patient Details  Name: [name] [name] MRN: [id number] Date of Birth: 09-Sep-1935  Today's Date: 11/05/2024 Time: SLP Start Time (ACUTE ONLY): 1440 SLP Stop Time (ACUTE ONLY): 1458 SLP Time Calculation (min) (ACUTE ONLY): 18 min  Past Medical History: No past medical history on file.  HPI:  [name] [name] is an 89 y.o. male who preasented to the hospital on 11/04/24 after being found down at a [facility] in the bathroom. He was found to have right gaze, aphasia, right facial droop, right sided weakness and right neglect. Initially 82% SpO2 on RA, improved to 99% on 6L. CTH negative for acute intracranial abnormality. MRI brain pending. CXR reported low lung volumes, patchy left basilar airspace opacities, which may reflect atelectasis or pneumonia. Neurology consulted who indicated that his presentation is most consistent with an unwitnessed seizure resulting in a fall. He failed [facility] with RN and SLP swallow evaluation ordered. PMH: HTN, DM, depression, prostate cancer s/p resection.    Assessment / Plan / Recommendation  Clinical Impression  Plan: NPO until alertness is consistent and agitation has decreased; floor stock thin liquids and puree solids. SLP will follow.  Patient is presenting with clinical s/s of what appears to be a cognitive-based, reversible dysphagia with impact from sedating medications. SLP assessed his swallow with thin liquids and puree solids. He was able to take consecutive straw sips of thin liquids (water) without stopping and completed approximately 4-5 ounces. No coughing during or after. He tolerated puree solids with and without whole medications without observed difficulty. SLP recommending continue NPO status until he is consistently awake and alert and non-agitated (currently in bilateral wrist and hand/mitt restraints as well as waist belt restraint). In the meantime, he is cleared to have floor stock thin liquids and puree solids  with full supervision from RN and only when adequately awake and alert. SLP will follow.   SLP Visit Diagnosis: Dysphagia, unspecified (R13.10)    Aspiration Risk  Mild aspiration risk    Diet Recommendation NPO except meds;Other (Comment) (floor stock purees and thin liquids with full RN supervision when awake and alert)    Liquid Administration via: Straw;Cup Medication Administration: Other (Comment) (as tolerated) Supervision: Full supervision/cueing for compensatory strategies Compensations: Slow rate;Small sips/bites;Minimize environmental distractions Postural Changes: Seated upright at 90 degrees    Other Recommendations Oral Care Recommendations: Oral care BID;Oral care before and after PO;Oral care prior to ice chip/H20;Staff/trained caregiver to provide oral care     Swallow Evaluation Recommendations     Assistance Recommended at Discharge    Functional Status Assessment Patient has had a recent decline in their functional status and demonstrates the ability to make significant improvements in function in a reasonable and predictable amount of time.  Frequency and Duration min 2x/week  1 week       Prognosis Prognosis for improved oropharyngeal function: Good      Swallow Study   General Date of Onset: 11/05/24 HPI: [name] [name] is an 89 y.o. male who preasented to the hospital on 11/04/24 after being found down at a [facility] in the bathroom. He was found to have right gaze, aphasia, right facial droop, right sided weakness and right neglect. Initially 82% SpO2 on RA, improved to 99% on 6L. CTH negative for acute intracranial abnormality. MRI brain pending. CXR reported low lung volumes, patchy left basilar airspace opacities, which may reflect atelectasis or pneumonia. Neurology consulted who indicated that his presentation is most consistent with an unwitnessed seizure resulting in a fall.  He failed [facility] with RN and SLP swallow evaluation ordered. PMH: HTN, DM,  depression, prostate cancer s/p resection. Type of Study: Bedside Swallow Evaluation Previous Swallow Assessment: none found Diet Prior to this Study: NPO Temperature Spikes Noted: No Respiratory Status: Nasal cannula History of Recent Intubation: No Behavior/Cognition: Alert;Confused;Requires cueing;Distractible Oral Cavity Assessment: Dry Oral Care Completed by SLP: Yes Oral Cavity - Dentition: Edentulous;Missing dentition;Dentures, not available Self-Feeding Abilities: Total assist Patient Positioning: Upright in bed Baseline Vocal Quality: Other (comment) (mildly dry/hoarse) Volitional Cough: Cognitively unable to elicit Volitional Swallow: Unable to elicit    Oral/Motor/Sensory Function Overall Oral Motor/Sensory Function: Other (comment) (no focal weakness observed, does not follow commands for complete OME)   Ice Chips Ice chips: Within functional limits   Thin Liquid Thin Liquid: Within functional limits Presentation: Straw    Nectar Thick     Honey Thick     Puree Puree: Within functional limits   Solid     Solid: Not tested      Norleen IVAR Blase, MA, CCC-SLP Speech Therapy  11/05/2024,3:59 PM

## 2024-11-05 NOTE — Progress Notes (Signed)
 Orthopedic Tech Progress Note Patient Details:  [name] [name] 10-22-1935 [id number]  Ortho Devices Type of Ortho Device: Prafo boot/shoe Ortho Device/Splint Location: BLE Ortho Device/Splint Interventions: Application   Post Interventions Patient Tolerated: Well  Massie BRAVO Yuniel Blaney 11/05/2024, 11:50 AM

## 2024-11-05 NOTE — Progress Notes (Signed)
 eLink Physician-Brief Progress Note Patient Name: Gevorg Brum DOB: 05-23-1935 MRN: [id number]   Date of Service  11/05/2024  HPI/Events of Note  Patient in ICU with seizures. Stable vitals and not in distress on camera. CCM and neuro note reviewed. RN reports some penile and scrotal excoriations and dribbling urine with possible tight foreskin.   eICU Interventions  Bladder scan May need to have urology consult in AM - will defer to day team once examination done Plan per CCM Call E link if needed      Intervention Category Evaluation Type: New Patient Evaluation  Cheryll KANDICE Bang 11/05/2024, 6:45 AM

## 2024-11-05 NOTE — ED Notes (Signed)
 RN notified, Leita Gleason PA, that pt CBG is in goal. New parameters will be placed shortly.

## 2024-11-05 NOTE — ED Notes (Signed)
 Patient transported to CT

## 2024-11-05 NOTE — ED Notes (Signed)
 Pt with seizure like activity in CT. Pt w/increased lethargy but arousable neuro status otherwise unchanged from previous assessment. Vitals stable. IP RN notified of possible seizure when transported to the floor.

## 2024-11-05 NOTE — Progress Notes (Signed)
 NEUROLOGY CONSULT FOLLOW UP NOTE   Date of service: November 05, 2024 Patient Name: [name] [name] MRN:  [id number] DOB:  09-03-1935  Interval Hx/subjective  Has been agitated this AM. In restraints.   Vitals   Vitals:   11/05/24 0428 11/05/24 0515 11/05/24 0640 11/05/24 0700  BP:  (!) 140/77 (!) 143/78 139/67  Pulse:  (!) 102 (!) 110 (!) 107  Resp:  (!) 24 (!) 32 14  Temp: 100 F (37.8 C)  100 F (37.8 C)   TempSrc: Axillary  Axillary   SpO2:  97% 94% 96%  Weight:         There is no height or weight on file to calculate BMI.  Physical Exam   Constitutional: Appears well-developed and well-nourished.  Psych: Awake and disoriented Eyes: No scleral injection.  HENT: No OP obstruction. Dry mucous membranes.  Head: Normocephalic. No neck stiffness Respiratory: Effort normal, non-labored breathing.  Skin: No rash seen to face or distal extremities bilaterally.    Neurologic Examination   Ment: Awake with mildly decreased level of alertness. Dysarthric speech is sparse but fluent. Poor orientation. Poor attention. Able to say I don't know when asked for what city he is in. Mild psychomotor agitation.  CN: PERRL. Blinks to threat bilateral visual fields. Eyes are conjugate. Has some difficulty attending to examiner when asking to track visually and seems to have more difficulty tracking to the right than to the left.  Motor: Moves all 4 extremities equally to command.  Sensory: Reacts to touch x 4 Reflexes: Unremarkable Cerebellar: Not tested due to restraints Gait: Unable to assess  Medications Current Medications[1]  Labs and Diagnostic Imaging   CBC:  Recent Labs  Lab 11/04/24 1825 11/04/24 1827 11/05/24 0315 11/05/24 0505  WBC 12.1*  --  17.3* 18.4*  NEUTROABS 7.0  --   --   --   HGB 15.2   < > 13.3 13.1  HCT 44.9   < > 39.9 38.4*  MCV 91.4  --  93.0 92.3  PLT 271  --  200 208   < > = values in this interval not displayed.    Basic Metabolic Panel:   Lab Results  Component Value Date   NA 138 11/05/2024   K 3.7 11/05/2024   CO2 22 11/05/2024   GLUCOSE 180 (H) 11/05/2024   BUN 23 11/05/2024   CREATININE 1.71 (H) 11/05/2024   CALCIUM 9.0 11/05/2024   GFRNONAA 38 (L) 11/05/2024   Lipid Panel: No results found for: LDLCALC HgbA1c: No results found for: HGBA1C Urine Drug Screen:     Component Value Date/Time   LABOPIA NEGATIVE 11/05/2024 0021   COCAINSCRNUR NEGATIVE 11/05/2024 0021   LABBENZ NEGATIVE 11/05/2024 0021   AMPHETMU NEGATIVE 11/05/2024 0021   THCU NEGATIVE 11/05/2024 0021   LABBARB NEGATIVE 11/05/2024 0021    Alcohol Level     Component Value Date/Time   [facility] <15 11/04/2024 1822   INR  Lab Results  Component Value Date   INR 1.0 11/04/2024   APTT  Lab Results  Component Value Date   APTT 22 (L) 11/04/2024     Assessment  [name] [name] is an 89 y.o. male with a PMHx of HTN, DM with frequent hypoglycemic episodes, partial medication compliance, depression, and prostate cancer s/p resection who presented on Friday after being found down at [facility] in the bathroom. Staff think he was in there for 15 minutes. Found with right gaze, aphasia, right facial froop, right sided weakness, and right  neglect. No seizure activity reported by EMS. BP 226/101, CBG 570, Initially 82% on room air and then 99% on 6L. On arrival to the ED, he was stuporous, nonverbal and not following commands, with right sided gaze, head turned to the right and some intermittent left sided movement, but flaccid RUE and RLE. No seizure activity seen immediately on arrival, but intermittent subtle right sided jerking was present in CT. He was loaded with Keppra  after which no further seizure activity was seen. He was admitted to the ICU for management of his severe hyperglycemia.  - Exam reveals a confused patient with mild psychomotor agitation. No clinical seizure activity appreciated.   - CT head: No acute intracranial abnormality. ASPECTS  10. - CTA of head and neck: Mixed atherosclerotic plaque at the right carotid bifurcation with approximately 85% stenosis at the origin of the right cervical ICA. Mixed atherosclerotic plaque at the left carotid bifurcation with approximately 60% stenosis at the origin of the left cervical ICA. Severe stenosis at the origin of the nondominant left vertebral artery. Atherosclerosis of the bilateral carotid siphons with moderate stenosis of the bilateral cavernous and paraophthalmic ICAs. 2 mm inferiorly directed outpouching along the left supraclinoid ICA, possibly a small aneurysm versus infundibulum at the origin of the posterior communicating artery. Additional 2 mm inferiorly directed outpouching at the P1 and P2 junction of the left PCA, concerning for a small aneurysm.  - LTM EEG report for today: Continuous slow, left fronto-temporal region. This study is suggestive of cortical dysfunction arising from  left fronto-temporal region likely secondary to underlying structural abnormality, post-ictal state. No seizures or epileptiform discharges were seen throughout the recording. 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  - Severe atherosclerotic stenoses of the bilateral ICAs - Poorly controlled diabetes   Recommendations  - Blood sugar management per primary team - OOB to chair when possible - Encourage PO hydration if there is no contraindication - IVF - SBP goal 140-180 given severe ICA stenoses bilaterally - MRI brain when able - MRI C-spine with and without contrast, per Radiology recommendations - Discontinuing LTM EEG  - Continue Keppra  at 500 mg IV BID for now. If MRI brain is negative for an epileptogenic lesion, then can consider tapering off Keppra  over 3 days.   - Cardiac telemetry and continuous pulse  oximetry with alarm - Inpatient seizure precautions - Outpatient seizure precautions: Per [place]  DMV statutes, patients with seizures are not allowed to drive until  they have been seizure-free for six months. Use caution when using heavy equipment or power tools. Avoid working on ladders or at heights. Take showers instead of baths. Ensure the water temperature is not too high on the home water heater. Do not go swimming alone. When caring for infants or small children, sit down when holding, feeding, or changing them to minimize risk of injury to the child in the event you have a seizure. Also, Maintain good sleep hygiene. Avoid alcohol. - Vascular Surgery consult for severe bilateral ICA stenoses   ______________________________________________________________________   Bonney SHARK, Primus Gritton, MD [facility]     [1]  Current Facility-Administered Medications:    azithromycin  (ZITHROMAX ) 500 mg in sodium chloride  0.9 % 250 mL IVPB, 500 mg, Intravenous, Q24H, Hunsucker, Donnice SAUNDERS, MD   cefTRIAXone  (ROCEPHIN ) 1 g in sodium chloride  0.9 % 100  mL IVPB, 1 g, Intravenous, Q24H, Hunsucker, Donnice SAUNDERS, MD   Chlorhexidine  Gluconate Cloth 2 % PADS 6 each, 6 each, Topical, Daily, Gleason, Leita SAUNDERS, PA-C   Chlorhexidine  Gluconate Cloth 2 % PADS 6 each, 6 each, Topical, Q0600, Maree Harder, MD, 6 each at 11/05/24 [phone]   dextrose  5 % in lactated ringers  infusion, , Intravenous, Continuous, Lenor Hollering, MD, Held at 11/05/24 0409   dextrose  50 % solution 0-50 mL, 0-50 mL, Intravenous, PRN, Belfi, Melanie, MD   docusate sodium  (COLACE) capsule 100 mg, 100 mg, Oral, BID PRN, Gleason, Laura R, PA-C   fentaNYL  (SUBLIMAZE ) injection 12.5 mcg, 12.5 mcg, Intravenous, Q4H PRN, Gleason, Leita SAUNDERS, PA-C, 12.5 mcg at 11/05/24 0351   heparin  injection 5,000 Units, 5,000 Units, Subcutaneous, Q8H, Gleason, Leita SAUNDERS, PA-C, 5,000 Units at 11/05/24 9372   insulin  aspart (novoLOG ) injection 0-15 Units, 0-15  Units, Subcutaneous, Q4H, Gleason, Leita SAUNDERS, PA-C, 3 Units at 11/05/24 9352   insulin  glargine (LANTUS ) injection 5 Units, 5 Units, Subcutaneous, BID, Gleason, Leita SAUNDERS, PA-C, 5 Units at 11/05/24 9286   insulin  regular, human (MYXREDLIN ) 100 units/ 100 mL infusion, , Intravenous, Continuous, Lenor Hollering, MD, Stopped at 11/05/24 0504   labetalol  (NORMODYNE ) injection 2.5 mg, 2.5 mg, Intravenous, Q2H PRN, Gleason, Leita SAUNDERS, PA-C   lactated ringers  infusion, , Intravenous, Continuous, Lenor Hollering, MD, Last Rate: 125 mL/hr at 11/05/24 0412, New Bag at 11/05/24 9587   levETIRAcetam  (KEPPRA ) undiluted injection 500 mg, 500 mg, Intravenous, Q12H, Odilon Cass, MD, 500 mg at 11/05/24 9372   Oral care mouth rinse, 15 mL, Mouth Rinse, PRN, Maree Harder, MD   polyethylene glycol (MIRALAX  / GLYCOLAX ) packet 17 g, 17 g, Oral, Daily PRN, Gleason, Leita SAUNDERS, PA-C   sodium chloride  flush (NS) 0.9 % injection 3 mL, 3 mL, Intravenous, Once, Lenor Hollering, MD

## 2024-11-05 NOTE — ED Notes (Signed)
"  Pt readjusted in bed  "

## 2024-11-06 ENCOUNTER — Other Ambulatory Visit: Payer: Self-pay

## 2024-11-06 DIAGNOSIS — E119 Type 2 diabetes mellitus without complications: Secondary | ICD-10-CM

## 2024-11-06 DIAGNOSIS — N179 Acute kidney failure, unspecified: Secondary | ICD-10-CM

## 2024-11-06 LAB — BASIC METABOLIC PANEL WITH GFR
Anion gap: 12 (ref 5–15)
BUN: 19 mg/dL (ref 8–23)
CO2: 20 mmol/L — ABNORMAL LOW (ref 22–32)
Calcium: 8.8 mg/dL — ABNORMAL LOW (ref 8.9–10.3)
Chloride: 105 mmol/L (ref 98–111)
Creatinine, Ser: 1.67 mg/dL — ABNORMAL HIGH (ref 0.61–1.24)
GFR, Estimated: 39 mL/min — ABNORMAL LOW
Glucose, Bld: 143 mg/dL — ABNORMAL HIGH (ref 70–99)
Potassium: 4.8 mmol/L (ref 3.5–5.1)
Sodium: 137 mmol/L (ref 135–145)

## 2024-11-06 LAB — GLUCOSE, CAPILLARY
Glucose-Capillary: 115 mg/dL — ABNORMAL HIGH (ref 70–99)
Glucose-Capillary: 124 mg/dL — ABNORMAL HIGH (ref 70–99)
Glucose-Capillary: 134 mg/dL — ABNORMAL HIGH (ref 70–99)
Glucose-Capillary: 197 mg/dL — ABNORMAL HIGH (ref 70–99)
Glucose-Capillary: 230 mg/dL — ABNORMAL HIGH (ref 70–99)
Glucose-Capillary: 289 mg/dL — ABNORMAL HIGH (ref 70–99)

## 2024-11-06 MED ADMIN — Insulin Glargine Inj 100 Unit/ML: 5 [IU] | SUBCUTANEOUS | NDC 00088222033

## 2024-11-06 MED ADMIN — Insulin Aspart Inj Soln 100 Unit/ML: 2 [IU] | SUBCUTANEOUS | NDC 73070010011

## 2024-11-06 MED ADMIN — Insulin Aspart Inj Soln 100 Unit/ML: 3 [IU] | SUBCUTANEOUS | NDC 73070010011

## 2024-11-06 MED ADMIN — Insulin Aspart Inj Soln 100 Unit/ML: 8 [IU] | SUBCUTANEOUS | NDC 73070010011

## 2024-11-06 MED ADMIN — CEFTRIAXONE  1 GM IVPB MIXTURE: 1 g | INTRAVENOUS | NDC 00143985701

## 2024-11-06 MED ADMIN — Doxycycline Hyclate Tab 100 MG: 100 mg | ORAL | NDC 24658031001

## 2024-11-06 MED ADMIN — Heparin Sodium (Porcine) Inj 5000 Unit/ML: 5000 [IU] | SUBCUTANEOUS | NDC 72572025501

## 2024-11-06 MED ADMIN — Chlorhexidine Gluconate Pads 2%: 6 | TOPICAL | NDC 53462070523

## 2024-11-06 MED ADMIN — Levetiracetam Inj 500 MG/5ML (100 MG/ML): 500 mg | INTRAVENOUS | NDC 72485010601

## 2024-11-06 MED ADMIN — Fentanyl Citrate PF Soln Prefilled Syringe 50 MCG/ML: 50 ug | INTRAVENOUS | NDC 63323080801

## 2024-11-06 MED ADMIN — Haloperidol Lactate Inj 5 MG/ML: 2 mg | INTRAVENOUS | NDC 67457042600

## 2024-11-06 MED ADMIN — Levetiracetam Tab 500 MG: 500 mg | ORAL | NDC 00904712461

## 2024-11-06 MED ADMIN — Gadobutrol Inj 1 MMOL/ML (604.72 MG/ML): 7.5 mL | INTRAVENOUS | NDC 50419032511

## 2024-11-06 MED FILL — Levetiracetam Tab 500 MG: 500.0000 mg | ORAL | Qty: 1 | Status: AC

## 2024-11-06 MED FILL — Nystatin Topical Powder 100000 Unit/GM: CUTANEOUS | Qty: 15 | Status: AC

## 2024-11-06 NOTE — Progress Notes (Signed)
 Speech Language Pathology Treatment: Dysphagia  Patient Details Name: [name] [name] MRN: [id number] DOB: 09-09-1935 Today's Date: 11/06/2024 Time: 8944-8888 SLP Time Calculation (min) (ACUTE ONLY): 16 min  Assessment / Plan / Recommendation Clinical Impression  SLP recommending initiate PO diet of Dys 1 (puree) solids, thin liquids and will continue to follow.  Patient seen by SLP for skilled treatment focused on dysphagia goals. He was more alert today and RN removed his restraint mitts. SLP assessed his toleration at bedside of dys 2 (minced) solids, puree solids and thin liquids. He exhibited prolonged mastication and bolus formation with minced solids but no oral residuals s/p swallows. No difficulty with puree solids and despite drinking via continuous straw sips (approximately 5 ounces without stopping), no overt s/s aspiration during or after PO intake.    HPI HPI: [name] [name] is an 89 y.o. male who preasented to the hospital on 11/04/24 after being found down at a [facility] in the bathroom. He was found to have right gaze, aphasia, right facial droop, right sided weakness and right neglect. Initially 82% SpO2 on RA, improved to 99% on 6L. CTH negative for acute intracranial abnormality. MRI brain pending. CXR reported low lung volumes, patchy left basilar airspace opacities, which may reflect atelectasis or pneumonia. Neurology consulted who indicated that his presentation is most consistent with an unwitnessed seizure resulting in a fall. He failed [facility] with RN and SLP swallow evaluation ordered. PMH: HTN, DM, depression, prostate cancer s/p resection.      SLP Plan  Continue with current plan of care        Swallow Evaluation Recommendations         Recommendations  Diet recommendations: Dysphagia 1 (puree);Thin liquid Liquids provided via: Straw;Cup Medication Administration: Crushed with puree Supervision: Full supervision/cueing for compensatory strategies;Staff to  assist with self feeding Compensations: Slow rate;Small sips/bites;Minimize environmental distractions Postural Changes and/or Swallow Maneuvers: Seated upright 90 degrees                  Oral care BID;Staff/trained caregiver to provide oral care   Frequent or constant Supervision/Assistance Dysphagia, unspecified (R13.10)     Continue with current plan of care     Norleen IVAR Blase, MA, CCC-SLP Speech Therapy   11/06/2024, 12:28 PM

## 2024-11-06 NOTE — Progress Notes (Signed)
 "  NAME:  [name] [name], MRN:  [id number], DOB:  08/26/1935, LOS: 1 ADMISSION DATE:  11/04/2024, CONSULTATION DATE:  11/06/2024 REFERRING MD:  EDP, CHIEF COMPLAINT:  AMS   History of Present Illness:  89 year old male with past medical history significant for DM, prostate Ca s/p resection, HTN who [facility] and was found in the bathroom with R gaze deviation, aphasia and R facial droop with R sided weakness and neglect.  Staff think he was in the bathroom approximately 15 minutes.  On EMS arrival he was hypertensive with a systolic blood pressure greater than 220 and hyperglycemic with right sided neglect.  He was taken to the emergency department and initially was open all extremities but not following commands.  He was taken to head CT which did not show any acute abnormalities.  There was concern for seizure like activity with some shaking in the CT scanner, so he was placed on EEG.  Labs were significant for glucose over 600, creatinine 2.0, and a white count of 17.  He had some agitation and required Ativan  and then was obtunded.  PCCM consulted in the setting  Pertinent  Medical History   Diabetes, hypertension, prostate cancer  Significant Hospital Events: Including procedures, antibiotic start and stop dates in addition to other pertinent events   12/27 admit with stroke versus seizure and agitation  Interim History / Subjective:  Less agitated, Taking PO. Bps fine.   Objective    Blood pressure 133/64, pulse 80, temperature 98.4 F (36.9 C), temperature source Axillary, resp. rate 19, weight 78.2 kg, SpO2 96%.        Intake/Output Summary (Last 24 hours) at 11/06/2024 0920 Last data filed at 11/06/2024 0656 Gross per 24 hour  Intake 845.44 ml  Output --  Net 845.44 ml   Filed Weights   11/04/24 1827  Weight: 78.2 kg    General: resting, lying in bed HEENT: EOMI pupils responsive Neuro: Moves all extremities, no deficits  CV: Regular rate and  rhythm PULM: Normal work of breathing, on nasal cannula supplementation GU: scrotum remains erythematous    Resolved problem list   Assessment and Plan   Right sided neglect, left gaze deviation: Neuro feels related to seizure, has ICA stenosis but it is on the right side. -- Improved  Metabolic encephalopathy: Related to hypoglycemia, postictal state.  Improved. -- Try to avoid centrally acting medications, difficult with agitation from fractures  Seizures: -- Appreciate neurology support, AEDs per neurology, currently on Keppra  -- EEG d/c'd per neurology  Right ICA stenosis: -- Per neurology note, vascular surgery has been consulted, check with vascular 12/29 Monday as non emergent  Pelvic fractures: Presumably from fall -- Orthopedics assistance appreciated,  PRAFO offloading wedge soft boots , TDWB RLE with walker and 1-2 person assist at all times  --follow up as outpatient for weightbearing pelvis films  --PT/OT ordered -- With preceding encephalopathy, start with low-dose fentanyl  for pain, can add oral agents once able to take p.o.  Presumed candidal balanitis and phimosis, scrotal skin infection: -- With phimosis, appreciate urology assistance, as long as voiding can avoid foley --fluconazole  150 mg x 1, s/p CTX (gonorrhea), doxycycline  100 mg BID x 7 days (possible chlamydia) --Nystatin  cream to scrotum  Diabetes with HHS: Encephalopathy improved with improvement in blood sugars. -- continue subcutaneous insulin   AKI: Creatinine improving with fluids. -- s/p MIVF, encourage PO intake, if becomes encephalopathic again consider resuming IVF  Pna: infiltrates on CT --CTX, and doxy for  CAP coverage  Ready for transfer out of ICU.  Labs   CBC: Recent Labs  Lab 11/04/24 1825 11/04/24 1827 11/05/24 0106 11/05/24 0315 11/05/24 0505  WBC 12.1*  --   --  17.3* 18.4*  NEUTROABS 7.0  --   --   --   --   HGB 15.2 16.0 12.9* 13.3 13.1  HCT 44.9 47.0 38.0* 39.9  38.4*  MCV 91.4  --   --  93.0 92.3  PLT 271  --   --  200 208    Basic Metabolic Panel: Recent Labs  Lab 11/04/24 1825 11/04/24 1827 11/05/24 0106 11/05/24 0315 11/05/24 0505 11/05/24 0953 11/05/24 1444  NA 133* 135 141 140 138 140 141  K 4.4 4.3 3.9 4.3 3.7 3.3* 3.8  CL 96* 98  --  105 104 107 107  CO2 25  --   --  24 22 22  21*  GLUCOSE 603* 580*  --  314* 180* 193* 123*  BUN 29* 33*  --  24* 23 23 21   CREATININE 2.00* 1.90*  --  1.69* 1.71* 1.73* 1.65*  CALCIUM 9.5  --   --  9.1 9.0 8.7* 9.2  MG  --   --   --   --  1.8  --   --   PHOS  --   --   --   --  2.0*  --   --    GFR: CrCl cannot be calculated (Unknown ideal weight.). Recent Labs  Lab 11/04/24 1825 11/05/24 0315 11/05/24 0505  WBC 12.1* 17.3* 18.4*    Liver Function Tests: Recent Labs  Lab 11/04/24 1825  AST 51*  ALT 39  ALKPHOS 170*  BILITOT 0.5  PROT 7.0  ALBUMIN 4.3   No results for input(s): LIPASE, AMYLASE in the last 168 hours. Recent Labs  Lab 11/05/24 0511  AMMONIA 25    ABG    Component Value Date/Time   PHART 7.393 11/05/2024 0106   PCO2ART 35.6 11/05/2024 0106   PO2ART 55 (L) 11/05/2024 0106   HCO3 21.8 11/05/2024 0106   TCO2 23 11/05/2024 0106   ACIDBASEDEF 3.0 (H) 11/05/2024 0106   O2SAT 89 11/05/2024 0106     Coagulation Profile: Recent Labs  Lab 11/04/24 1825  INR 1.0    Cardiac Enzymes: No results for input(s): CKTOTAL, CKMB, CKMBINDEX, TROPONINI in the last 168 hours.  HbA1C: Hgb A1c MFr Bld  Date/Time Value Ref Range Status  11/05/2024 09:53 AM 12.1 (H) 4.8 - 5.6 % Final    Comment:    (NOTE) Diagnosis of Diabetes The following HbA1c ranges recommended by the American Diabetes Association (ADA) may be used as an aid in the diagnosis of diabetes mellitus.  Hemoglobin             Suggested A1C NGSP%              Diagnosis  <5.7                   Non Diabetic  5.7-6.4                Pre-Diabetic  >6.4                   Diabetic  <7.0                    Glycemic control for  adults with diabetes.      CBG: Recent Labs  Lab 11/05/24 1606 11/05/24 2045 11/05/24 2340 11/06/24 0338 11/06/24 0749  GLUCAP 130* 108* 126* 134* 124*    Review of Systems:   Unable to obtain   Past Medical History:  He,  has no past medical history on file.   Surgical History:     Social History:      Family History:  His family history is not on file.   Allergies Allergies[1]   Home Medications  Prior to Admission medications  Medication Sig Start Date End Date Taking? Authorizing Provider  atorvastatin (LIPITOR) 40 MG tablet Take 40 mg by mouth daily. 10/29/24  Yes [provider]  ferrous sulfate 325 (65 FE) MG tablet Take 325 mg by mouth every other day.   Yes [provider]  JARDIANCE 25 MG TABS tablet Take 25 mg by mouth daily. 08/16/24  Yes [provider]  venlafaxine XR (EFFEXOR-XR) 150 MG 24 hr capsule Take 150 mg by mouth daily. 07/26/24  Yes [provider]     Critical care time:  n/a      Donnice JONELLE Beals, MD [facility] See Amion for contact info If no response to pager, please call [phone] until 7pm After 7:00 pm call Elink  [phone]        [1] No Known Allergies  "

## 2024-11-06 NOTE — Consult Note (Signed)
 [facility] [place]  ASSESSMENT / PLAN: 89 y.o. male with bilateral carotid stenosis, right (>80%) worse than left (60%). Unclear if symptomatic or not at this point in workup. Awaiting MRI.  Recommend:  Abstinence from all tobacco products. Blood glucose control with goal A1c < 7%. Blood pressure control with goal blood pressure < 130/80 mmHg. Lipid reduction therapy with goal LDL-C < 55 mg/dL. Aspirin 81mg  by mouth daily. Clopidogrel 75mg  by mouth daily. Atorvastatin 40-80mg  PO QD (or other high intensity statin therapy).  Patient not a candidate for asymptomatic intervention given multiple medical comorbidities and advanced age.  If MRI shows evidence of stroke attributable to carotid lesion, would consider symptomatic intervention to reduce risk of stroke going forward.  Will check in tomorrow.  CHIEF COMPLAINT: Found down  HISTORY OF PRESENT ILLNESS: [name] [name] is a 88 y.o. male found down in a [facility] bathroom yesterday, 11/04/2024.  Concern for stroke versus seizure during presentation.  Stroke workup initiated.  Significant bilateral carotid artery plaque noted on CT angiogram of the head and neck.  No discrete evidence of stroke on CT scan (ASPECTS 10).  MRI pending.  On my evaluation, the patient is somnolent.  He does awake easily.  He seems to know who he is, but does not know the situation regarding his admission.  Past medical history significant for diabetes.  Past surgical history noncontributory  No family history on file.  Social History   Socioeconomic History   Marital status: Married    Spouse name: Not on file   Number of children: Not on file   Years of education: Not on file   Highest education level: Not on file  Occupational History   Not on file  Tobacco Use   Smoking status: Not on file   Smokeless tobacco: Not on file  Substance and Sexual Activity   Alcohol use: Not on file   Drug use: Not on file   Sexual  activity: Not on file  Other Topics Concern   Not on file  Social History Narrative   Not on file   Social Drivers of Health   Tobacco Use: Not on file  Financial Resource Strain: Not on file  Food Insecurity: Not on file  Transportation Needs: Not on file  Physical Activity: Not on file  Stress: Not on file  Social Connections: Not on file  Intimate Partner Violence: Not on file  Depression (PHQ2-9): Not on file  Alcohol Screen: Not on file  Housing: Not on file  Utilities: Not on file  Health Literacy: Not on file    Allergies[1]  Current Facility-Administered Medications  Medication Dose Route Frequency Provider Last Rate Last Admin   0.9 %  sodium chloride  infusion  250 mL Intravenous Continuous Hunsucker, Donnice SAUNDERS, MD       cefTRIAXone  (ROCEPHIN ) 1 g in sodium chloride  0.9 % 100 mL IVPB  1 g Intravenous Q24H Hunsucker, Donnice SAUNDERS, MD   Stopped at 11/05/24 2245   Chlorhexidine  Gluconate Cloth 2 % PADS 6 each  6 each Topical Daily Gleason, Leita SAUNDERS, PA-C   6 each at 11/06/24 1047   Chlorhexidine  Gluconate Cloth 2 % PADS 6 each  6 each Topical Q0600 Maree Harder, MD   6 each at 11/06/24 0603   dextrose  50 % solution 0-50 mL  0-50 mL Intravenous PRN Lenor Hollering, MD       docusate sodium  (COLACE) capsule 100 mg  100 mg Oral BID PRN Gleason, Leita SAUNDERS,  PA-C       doxycycline  (VIBRA -TABS) tablet 100 mg  100 mg Oral Q12H Hunsucker, Donnice SAUNDERS, MD   100 mg at 11/06/24 1047   fentaNYL  (SUBLIMAZE ) injection 50 mcg  50 mcg Intravenous Q4H PRN Hunsucker, Donnice SAUNDERS, MD   50 mcg at 11/06/24 9171   haloperidol  lactate (HALDOL ) injection 2 mg  2 mg Intravenous Q6H PRN Hunsucker, Donnice SAUNDERS, MD   2 mg at 11/06/24 0051   heparin  injection 5,000 Units  5,000 Units Subcutaneous Q8H Gleason, Laura R, PA-C   5,000 Units at 11/06/24 9396   insulin  aspart (novoLOG ) injection 0-15 Units  0-15 Units Subcutaneous Q4H Gleason, Laura R, PA-C   2 Units at 11/06/24 0825   insulin  glargine (LANTUS ) injection  5 Units  5 Units Subcutaneous BID Gleason, Laura R, PA-C   5 Units at 11/06/24 1046   labetalol  (NORMODYNE ) injection 2.5 mg  2.5 mg Intravenous Q2H PRN Gleason, Laura R, PA-C       levETIRAcetam  (KEPPRA ) undiluted injection 500 mg  500 mg Intravenous Q12H Merrianne Locus, MD   500 mg at 11/06/24 9396   nystatin  (MYCOSTATIN /NYSTOP ) topical powder   Topical TID Hunsucker, Donnice SAUNDERS, MD   Given at 11/06/24 1046   Oral care mouth rinse  15 mL Mouth Rinse PRN Maree Harder, MD       polyethylene glycol (MIRALAX  / GLYCOLAX ) packet 17 g  17 g Oral Daily PRN Gleason, Leita SAUNDERS, PA-C       sodium chloride  flush (NS) 0.9 % injection 3 mL  3 mL Intravenous Once Lenor Hollering, MD        PHYSICAL EXAM Vitals:   11/06/24 0500 11/06/24 0600 11/06/24 0700 11/06/24 0800  BP: (!) 125/111 (!) 135/59 (!) 144/67 133/64  Pulse: 97 82 81 80  Resp: (!) 22 (!) 22 20 19   Temp:    98.4 F (36.9 C)  TempSrc:    Axillary  SpO2: 99% 96% 95% 96%  Weight:       Elderly man in no acute distress Regular rate and rhythm Unlabored breathing Somnolent.  Normal cranial nerve exam In restraints.  PERTINENT LABORATORY AND RADIOLOGIC DATA  Most recent CBC    Latest Ref Rng & Units 11/05/2024    5:05 AM 11/05/2024    3:15 AM 11/05/2024    1:06 AM  CBC  WBC 4.0 - 10.5 K/uL 18.4  17.3    Hemoglobin 13.0 - 17.0 g/dL 86.8  86.6  87.0   Hematocrit 39.0 - 52.0 % 38.4  39.9  38.0   Platelets 150 - 400 K/uL 208  200       Most recent CMP    Latest Ref Rng & Units 11/05/2024    2:44 PM 11/05/2024    9:53 AM 11/05/2024    5:05 AM  CMP  Glucose 70 - 99 mg/dL 876  806  819   BUN 8 - 23 mg/dL 21  23  23    Creatinine 0.61 - 1.24 mg/dL 8.34  8.26  8.28   Sodium 135 - 145 mmol/L 141  140  138   Potassium 3.5 - 5.1 mmol/L 3.8  3.3  3.7   Chloride 98 - 111 mmol/L 107  107  104   CO2 22 - 32 mmol/L 21  22  22    Calcium 8.9 - 10.3 mg/dL 9.2  8.7  9.0    CT ANGIOGRAPHY OF THE HEAD AND NECK CT PERFUSION BRAIN 11/04/2024  06:45:49 PM   TECHNIQUE:  Contiguous axial images were obtained from the base of the skull through the vertex without intravenous contrast. Multidetector CT imaging of the head and neck was performed using the standard protocol during bolus administration of 100 mL of iohexol  (OMNIPAQUE ) 350 MG/ML injection. 3D postprocessing with multiplanar reconstructions and MIPs was performed to evaluate the vascular anatomy. Carotid stenosis measurements (when applicable) are obtained utilizing NASCET criteria, using the distal internal carotid diameter as the denominator. Cerebral perfusion analysis using computed tomography with contrast administration, including post-processing of parametric maps with determination of cerebral blood flow, cerebral blood volume, mean transit time and time-to-maximum.   RADIATION DOSE REDUCTION: This exam was performed according to the departmental dose-optimization program which includes automated exposure control, adjustment of the mA and/or kV according to patient size and/or use of iterative reconstruction technique.   CONTRAST: Without and with IV contrast. 100 mL of iohexol  (OMNIPAQUE ) 350 MG/ML injection.   COMPARISON: CT head dated 11/04/2024.   CLINICAL HISTORY: Neuro deficit, acute, stroke suspected.   FINDINGS:   CT HEAD:   Question prominence of the bilateral optic nerve sheath complexes; recommend correlation with fundoscopic exam findings.     CTA NECK:   AORTIC ARCH AND ARCH VESSELS: Moderate atherosclerosis of the visualized aortic arch. 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 Atherosclerosis at the origin of the aberrant right subclavian resulting in focal mild stenosis. Moderate stenosis at the origin of the left subclavian artery. No dissection or arterial injury.   CERVICAL CAROTID ARTERIES: Mixed atherosclerotic plaque at the right carotid bifurcation resulting in 85% stenosis of the proximal right  cervical ICA. Additional moderate stenosis at the origin of the right external carotid artery. Mixed atherosclerotic plaque at the left carotid bifurcation which results in approximately 60% stenosis at the origin of the left cervical ICA. No dissection or arterial injury.   CERVICAL VERTEBRAL ARTERIES: Right vertebral artery origin is obscured due to streak artifact from dense venous contrast. The right vertebral artery is dominant. Atherosclerosis of the origin of the nondominant left vertebral artery resulting in focal severe stenosis. No dissection or arterial injury.   LUNGS AND MEDIASTINUM: Opacities within the medial aspect of the right upper lobe. There is bronchial wall thickening within the right upper lobe which may be related to infection.   SOFT TISSUES: Hypoplastic appearance of the thyroid with nonvisualization of the left thyroid lobe.   BONES: Degenerative changes in the visualized spine. There is 5 mm anterolisthesis of C3 on C4 likely related to chronic facet degenerative changes. Dental caries. Sclerosis in the right aspect of the C7 vertebral body and the right pedicle of C7 which could reflect an osseous lesion; recommend correlation with MRI of the cervical spine.   CTA HEAD:   ANTERIOR CIRCULATION: Atherosclerosis of the bilateral carotid siphons. There is moderate stenosis of the bilateral cavernous and paraophthalmic ICAs. There is a 2 mm inferiorly directed outpouching along the left supraclinoid ICA which may reflect a small aneurysm versus infundibulum at the origin of the posterior communicating artery. A1 segment of the right ACA likely congenital. No significant stenosis of the anterior cerebral arteries. No significant stenosis of the middle cerebral arteries.   POSTERIOR CIRCULATION: There is a 2 mm inferiorly directed outpouching at the P1 and P2 junction of the left PCA concerning for small aneurysm (series 5, image 134). No significant  stenosis of the posterior cerebral arteries. No significant stenosis of the basilar artery. No significant stenosis of the vertebral arteries.   OTHER:  No dural venous sinus thrombosis on this non-dedicated study.   CT PERFUSION:   EXAM QUALITY: Exam quality is adequate with diagnostic perfusion maps. No significant motion artifact. Appropriate arterial inflow and venous outflow curves.   CORE INFARCT (CBF<30% volume): 0 mL   TOTAL HYPOPERFUSION (Tmax>6s volume): 3 mL   PENUMBRA: Mismatch volume: 3 mL Mismatch ratio: not applicable Location: not applicable   IMPRESSION: 1. No large vessel occlusion. 2. No evidence of ischemia by CT brain perfusion. 3. Mixed atherosclerotic plaque at the right carotid bifurcation with approximately 85% stenosis at the origin of the right cervical ICA. 4. Mixed atherosclerotic plaque at the left carotid bifurcation with approximately 60% stenosis at the origin of the left cervical ICA. 5. Severe stenosis at the origin of the nondominant left vertebral artery. 6. Atherosclerosis of the bilateral carotid siphons with moderate stenosis of the bilateral cavernous and paraophthalmic ICAs. 7. 2 mm inferiorly directed outpouching along the left supraclinoid ICA, possibly a small aneurysm versus infundibulum at the origin of the posterior communicating artery. 8. Additional 2 mm inferiorly directed outpouching at the P1 and P2 junction of the left PCA, concerning for a small aneurysm. 9. Question prominence of the bilateral optic nerve sheath complexes. Recommend correlation with fundoscopic exam findings. 10. Sclerosis in the right aspect of the C7 vertebral body and right pedicle, which could reflect an osseous lesion. Recommend MRI of the cervical spine with and without contrast. 11. Opacities and bronchial wall thickening in the medial right upper lobe, which may be related to infection.   Electronically signed by: Donnice Mania MD 11/04/2024  07:21 PM EST RP Workstation: [id number]  Debby SAILOR. Magda, MD [facility] Phone Number: [phone] 11/06/2024 10:52 AM   Total time spent on preparing this encounter including chart review, data review, collecting history, examining the patient, and coordinating care: 80 minutes  Portions of this report may have been transcribed using voice recognition software.  Every effort has been made to ensure accuracy; however, inadvertent computerized transcription errors may still be present.       [1] No Known Allergies

## 2024-11-06 NOTE — Plan of Care (Signed)
" °  Problem: Safety: Goal: Non-violent Restraint(s) Outcome: Progressing   Problem: Education: Goal: Ability to describe self-care measures that may prevent or decrease complications (Diabetes Survival Skills Education) will improve Outcome: Progressing Goal: Individualized Educational Video(s) Outcome: Progressing   Problem: Health Behavior/Discharge Planning: Goal: Ability to manage health-related needs will improve Outcome: Progressing   Problem: Metabolic: Goal: Ability to maintain appropriate glucose levels will improve Outcome: Progressing   "

## 2024-11-07 ENCOUNTER — Other Ambulatory Visit (HOSPITAL_COMMUNITY): Payer: Self-pay

## 2024-11-07 DIAGNOSIS — G40109 Localization-related (focal) (partial) symptomatic epilepsy and epileptic syndromes with simple partial seizures, not intractable, without status epilepticus: Secondary | ICD-10-CM | POA: Diagnosis not present

## 2024-11-07 DIAGNOSIS — G9389 Other specified disorders of brain: Secondary | ICD-10-CM | POA: Diagnosis not present

## 2024-11-07 DIAGNOSIS — R569 Unspecified convulsions: Secondary | ICD-10-CM | POA: Diagnosis not present

## 2024-11-07 DIAGNOSIS — I10 Essential (primary) hypertension: Secondary | ICD-10-CM | POA: Diagnosis not present

## 2024-11-07 DIAGNOSIS — I7090 Unspecified atherosclerosis: Secondary | ICD-10-CM | POA: Diagnosis not present

## 2024-11-07 DIAGNOSIS — E1165 Type 2 diabetes mellitus with hyperglycemia: Secondary | ICD-10-CM | POA: Diagnosis not present

## 2024-11-07 LAB — GLUCOSE, CAPILLARY
Glucose-Capillary: 79 mg/dL (ref 70–99)
Glucose-Capillary: 132 mg/dL — ABNORMAL HIGH (ref 70–99)
Glucose-Capillary: 274 mg/dL — ABNORMAL HIGH (ref 70–99)
Glucose-Capillary: 119 mg/dL — ABNORMAL HIGH (ref 70–99)
Glucose-Capillary: 213 mg/dL — ABNORMAL HIGH (ref 70–99)

## 2024-11-07 MED ADMIN — Insulin Glargine Inj 100 Unit/ML: 5 [IU] | SUBCUTANEOUS | NDC 00088222033

## 2024-11-07 MED ADMIN — Insulin Aspart Inj Soln 100 Unit/ML: 5 [IU] | SUBCUTANEOUS | NDC 73070010011

## 2024-11-07 MED ADMIN — Insulin Aspart Inj Soln 100 Unit/ML: 8 [IU] | SUBCUTANEOUS | NDC 73070010011

## 2024-11-07 MED ADMIN — CEFTRIAXONE  1 GM IVPB MIXTURE: 1 g | INTRAVENOUS | NDC 00143985701

## 2024-11-07 MED ADMIN — Doxycycline Hyclate Tab 100 MG: 100 mg | ORAL | NDC 24658031001

## 2024-11-07 MED ADMIN — Doxycycline Hyclate Tab 100 MG: 100 mg | ORAL | NDC 00904043006

## 2024-11-07 MED ADMIN — Heparin Sodium (Porcine) Inj 5000 Unit/ML: 5000 [IU] | SUBCUTANEOUS | NDC 72572025501

## 2024-11-07 MED ADMIN — Acetaminophen Tab 500 MG: 1000 mg | ORAL | NDC 50580045711

## 2024-11-07 MED ADMIN — Levetiracetam Tab 500 MG: 500 mg | ORAL | NDC 68084087011

## 2024-11-07 MED FILL — Acetaminophen Tab 500 MG: 1000.0000 mg | ORAL | Qty: 2 | Status: AC

## 2024-11-07 MED FILL — Medication: Qty: 1 | Status: AC

## 2024-11-07 NOTE — Evaluation (Signed)
 Physical Therapy Evaluation Patient Details Name: [name] [name] MRN: [id number] DOB: 1935/02/08 Today's Date: 11/07/2024  History of Present Illness  Pt is an 89 y.o. male presenting 12/26 after being found down in [facility] restroom with R gaze, aphasia, R weakness. CXR reported low lung volumes, patchy left basilar airspace opacities, which may reflect atelectasis or pneumonia. MRI brain negative; suspected to be s/p seizure. Found to have R superior and inferior pubic rami fx with extension into anterior column acetabulum. Per ortho notes, TDWB RLE. PMH: DM, HTN, prostate CA s/p resection.  Clinical Impression   Pt admitted secondary to problem above with deficits below. PTA patient lives at home with wife with 4 steps to enter and bedroom on second floor. He was using a rollator in the community. Pt currently requires +2 min assist for bed mobility and stand-pivot with RW to maintain RLE TDWB (which he maintained ~60% of the time). Anticipate patient will benefit from PT to address problems listed below. Will continue to follow acutely to maximize functional mobility, independence, and safety. Based on his home set-up and TDWB status, he will need longer length of stay in post-acute inpatient therapy <3 hrs/day.           If plan is discharge home, recommend the following: Two people to help with walking and/or transfers;Assistance with cooking/housework;Direct supervision/assist for medications management;Direct supervision/assist for financial management;Assist for transportation;Help with stairs or ramp for entrance;Supervision due to cognitive status   Can travel by private vehicle   No    Equipment Recommendations Rolling walker (2 wheels);Wheelchair (measurements PT);Wheelchair cushion (measurements PT)  Recommendations for Other Services       Functional Status Assessment Patient has had a recent decline in their functional status and demonstrates the ability to make significant  improvements in function in a reasonable and predictable amount of time.     Precautions / Restrictions Precautions Precautions: Fall Recall of Precautions/Restrictions: Impaired Restrictions Weight Bearing Restrictions Per Provider Order: Yes RLE Weight Bearing Per Provider Order: Touchdown weight bearing      Mobility  Bed Mobility Overal bed mobility: Needs Assistance Bed Mobility: Supine to Sit     Supine to sit: Min assist, HOB elevated, Used rails     General bed mobility comments: assist to move RLE over EOB (to pt's left); used rail plus PTs hand to pull up to sitting    Transfers Overall transfer level: Needs assistance Equipment used: Rolling walker (2 wheels) Transfers: Sit to/from Stand Sit to Stand: +2 physical assistance, From elevated surface, Min assist           General transfer comment: Patient's rt foot placed on top of PTs  foot to monitor TDWB; pt required cues ~40% of the time to maintain TDWB while step-pivoting to the recliner    Ambulation/Gait               General Gait Details: unable to consistently maintain TDWB RLE  Stairs            Wheelchair Mobility     Tilt Bed    Modified Rankin (Stroke Patients Only)       Balance Overall balance assessment: Needs assistance, History of Falls Sitting-balance support: No upper extremity supported, Feet supported Sitting balance-Leahy Scale: Fair     Standing balance support: Bilateral upper extremity supported, During functional activity, Reliant on assistive device for balance Standing balance-Leahy Scale: Poor  Pertinent Vitals/Pain Pain Assessment Pain Assessment: Faces Faces Pain Scale: Hurts a little bit Pain Location: rt hip Pain Descriptors / Indicators: Discomfort Pain Intervention(s): Limited activity within patient's tolerance, Monitored during session, Repositioned    Home Living Family/patient expects to be  discharged to:: Private residence Living Arrangements: Spouse/significant other Available Help at Discharge: Family;Available 24 hours/day Type of Home: House Home Access: Stairs to enter Entrance Stairs-Rails: Right;Left;Can reach both Entrance Stairs-Number of Steps: 4 Alternate Level Stairs-Number of Steps: 14 Home Layout: Two level;Bed/bath upstairs Home Equipment: Grab bars - tub/shower;Rollator (4 wheels)      Prior Function Prior Level of Function : Independent/Modified Independent;Driving             Mobility Comments: uses rollator when goes out ADLs Comments: wife lays out meds for him     Extremity/Trunk Assessment   Upper Extremity Assessment Upper Extremity Assessment: Defer to OT evaluation    Lower Extremity Assessment Lower Extremity Assessment: Overall WFL for tasks assessed    Cervical / Trunk Assessment Cervical / Trunk Assessment: Normal  Communication   Communication Communication: No apparent difficulties    Cognition Arousal: Alert Behavior During Therapy: WFL for tasks assessed/performed   PT - Cognitive impairments: No family/caregiver present to determine baseline                       PT - Cognition Comments: oriented to self, place, month; not situation; initially stated he owns no DME and later states he uses a rollator when grocery shoppig Following commands: Impaired Following commands impaired: Follows one step commands inconsistently, Follows one step commands with increased time     Cueing Cueing Techniques: Verbal cues, Tactile cues     General Comments      Exercises     Assessment/Plan    PT Assessment Patient needs continued PT services  PT Problem List Decreased activity tolerance;Decreased balance;Decreased mobility;Decreased cognition;Decreased knowledge of use of DME;Decreased safety awareness;Decreased knowledge of precautions;Pain       PT Treatment Interventions DME instruction;Gait training;Stair  training;Functional mobility training;Therapeutic activities;Therapeutic exercise;Balance training;Cognitive remediation;Patient/family education;Wheelchair mobility training    PT Goals (Current goals can be found in the Care Plan section)  Acute Rehab PT Goals Patient Stated Goal: none stated PT Goal Formulation: With patient Time For Goal Achievement: 11/21/24 Potential to Achieve Goals: Good    Frequency Min 2X/week     Co-evaluation               AM-PAC PT 6 Clicks Mobility  Outcome Measure Help needed turning from your back to your side while in a flat bed without using bedrails?: A Little Help needed moving from lying on your back to sitting on the side of a flat bed without using bedrails?: Total Help needed moving to and from a bed to a chair (including a wheelchair)?: Total Help needed standing up from a chair using your arms (e.g., wheelchair or bedside chair)?: Total Help needed to walk in hospital room?: Total Help needed climbing 3-5 steps with a railing? : Total 6 Click Score: 8    End of Session Equipment Utilized During Treatment: Gait belt Activity Tolerance: Patient tolerated treatment well Patient left: in chair;with call bell/phone within reach;with chair alarm set Nurse Communication: Mobility status;Weight bearing status PT Visit Diagnosis: Other abnormalities of gait and mobility (R26.89);History of falling (Z91.81);Pain Pain - Right/Left: Right Pain - part of body: Hip    Time: 9073-9052 PT Time Calculation (min) (ACUTE ONLY): 21  min   Charges:   PT Evaluation $PT Eval Low Complexity: 1 Low   PT General Charges $$ ACUTE PT VISIT: 1 Visit          Macario RAMAN, PT Acute Rehabilitation Services  Office [phone]   Macario SHAUNNA Soja 11/07/2024, 10:04 AM

## 2024-11-07 NOTE — Progress Notes (Signed)
 Occupational Therapy Treatment Patient Details Name: [name] [name] MRN: [id number] DOB: May 18, 1935 Today's Date: 11/07/2024   History of present illness Pt is an 89 y.o. male presenting 12/26 after being found down in [place] restroom with R gaze, aphasia, R weakness. CXR reported low lung volumes, patchy left basilar airspace opacities, which may reflect atelectasis or pneumonia. MRI brain negative; suspected to be s/p seizure. Found to have R superior and inferior pubic rami fx with extension into anterior column acetabulum. Per ortho notes, TDWB RLE. PMH: DM, HTN, prostate CA s/p resection.   OT comments  Entering room to assist RN with transfer to/from [facility]. Pt with recall of TWB precautions but continues to require constant cueing. Pt with R inattention notable predominantly with attempt to move toward R back to chair after toileting. Pt needing cues for all portions of mobility with fair adherence to weightbearing precautions. Will continue to follow.       If plan is discharge home, recommend the following:  A lot of help with walking and/or transfers;Two people to help with walking and/or transfers;A lot of help with bathing/dressing/bathroom;Assistance with cooking/housework;Assist for transportation;Help with stairs or ramp for entrance   Equipment Recommendations  Other (comment) (defer)    Recommendations for Other Services      Precautions / Restrictions Precautions Precautions: Fall Recall of Precautions/Restrictions: Impaired Restrictions Weight Bearing Restrictions Per Provider Order: Yes RLE Weight Bearing Per Provider Order: Touchdown weight bearing       Mobility Bed Mobility Overal bed mobility: Needs Assistance Bed Mobility: Supine to Sit     Supine to sit: Min assist, HOB elevated, Used rails     General bed mobility comments: assist to move RLE over EOB (to pt's left); used rail plus PTs hand to pull up to sitting    Transfers Overall transfer  level: Needs assistance Equipment used: Rolling walker (2 wheels) Transfers: Sit to/from Stand, Bed to chair/wheelchair/BSC Sit to Stand: +2 physical assistance, From elevated surface, Min assist     Step pivot transfers: Min assist, Mod assist, +2 safety/equipment     General transfer comment: min A toward BSC on L; mod A with dense cues for sequencing, moving toward R going back to chair     Balance Overall balance assessment: Needs assistance, History of Falls Sitting-balance support: No upper extremity supported, Feet supported Sitting balance-Leahy Scale: Fair     Standing balance support: Bilateral upper extremity supported, During functional activity, Reliant on assistive device for balance Standing balance-Leahy Scale: Poor                             ADL either performed or assessed with clinical judgement   ADL Overall ADL's : Needs assistance/impaired Eating/Feeding: Set up;Sitting;Bed level (in chair)   Grooming: Contact guard assist;Sitting   Upper Body Bathing: Contact guard assist;Sitting   Lower Body Bathing: Maximal assistance;Sit to/from stand   Upper Body Dressing : Contact guard assist;Sitting   Lower Body Dressing: Maximal assistance;Sit to/from stand   Toilet Transfer: Moderate assistance;Stand-pivot;Rolling walker (2 wheels) Toilet Transfer Details (indicate cue type and reason): min A to L; mod A with dense cues to R Toileting- Clothing Manipulation and Hygiene: Total assistance;Sit to/from stand       Functional mobility during ADLs: Moderate assistance;Rolling walker (2 wheels)      Extremity/Trunk Assessment Upper Extremity Assessment Upper Extremity Assessment: RUE deficits/detail;Right hand dominant RUE Deficits / Details: mild weakness as compared to L; questionable  proprioceptive difficulties needing cues for correct positioning on RW   Lower Extremity Assessment Lower Extremity Assessment: Defer to PT evaluation    Cervical / Trunk Assessment Cervical / Trunk Assessment: Normal    Vision Baseline Vision/History: 0 No visual deficits Patient Visual Report: No change from baseline Vision Assessment?: Vision impaired- to be further tested in functional context;Yes Tracking/Visual Pursuits: Other (comment) (decr smoothness of vertical tracking on R) Visual Fields: Impaired-to be further tested in functional context Additional Comments: decreased consistent accuracy of report in RLQ during visual fields assessment. Difficulty with vertical tracking on the R   Perception Perception Perception: Impaired Preception Impairment Details: Inattention/Neglect Perception-Other Comments: R   Praxis     Communication Communication Communication: No apparent difficulties   Cognition Arousal: Alert Behavior During Therapy: WFL for tasks assessed/performed Cognition: Cognition impaired   Orientation impairments: Situation (was unaware of bony injuries) Awareness: Online awareness impaired, Intellectual awareness impaired Memory impairment (select all impairments): Short-term memory, Working memory Attention impairment (select first level of impairment): Sustained attention, Selective attention Executive functioning impairment (select all impairments): Organization, Sequencing, Problem solving                   Following commands: Impaired Following commands impaired: Follows one step commands inconsistently, Follows one step commands with increased time      Cueing   Cueing Techniques: Verbal cues, Tactile cues  Exercises      Shoulder Instructions       General Comments      Pertinent Vitals/ Pain       Pain Assessment Pain Assessment: Faces Faces Pain Scale: Hurts a little bit Pain Location: rt hip Pain Descriptors / Indicators: Discomfort Pain Intervention(s): Limited activity within patient's tolerance  Home Living Family/patient expects to be discharged to:: Private  residence Living Arrangements: Spouse/significant other Available Help at Discharge: Family;Available 24 hours/day Type of Home: House Home Access: Stairs to enter [employer] of Steps: 4 Entrance Stairs-Rails: Right;Left;Can reach both Home Layout: Two level;Bed/bath upstairs Alternate Level Stairs-Number of Steps: 14 Alternate Level Stairs-Rails: Right;Left;Can reach both Bathroom Shower/Tub: Producer, Television/film/video: Standard     Home Equipment: Grab bars - tub/shower;Rollator (4 wheels)          Prior Functioning/Environment              Frequency  Min 2X/week        Progress Toward Goals  OT Goals(current goals can now be found in the care plan section)  Progress towards OT goals: Progressing toward goals  Acute Rehab OT Goals Patient Stated Goal: get better OT Goal Formulation: With patient Time For Goal Achievement: 11/21/24 Potential to Achieve Goals: Good ADL Goals Pt Will Perform Grooming: with set-up;sitting Pt Will Perform Lower Body Dressing: with min assist;sit to/from stand Pt Will Transfer to Toilet: with contact guard assist;stand pivot transfer Additional ADL Goal #1: pt will visually attend to R side of evnironment during ADL without cues.  Plan      Co-evaluation                 AM-PAC OT 6 Clicks Daily Activity     Outcome Measure   Help from another person eating meals?: A Little Help from another person taking care of personal grooming?: A Little Help from another person toileting, which includes using toliet, bedpan, or urinal?: A Lot Help from another person bathing (including washing, rinsing, drying)?: A Lot Help from another person to put on and taking  off regular upper body clothing?: A Little Help from another person to put on and taking off regular lower body clothing?: A Lot 6 Click Score: 15    End of Session Equipment Utilized During Treatment: Gait belt;Rolling walker (2 wheels)  OT  Visit Diagnosis: Unsteadiness on feet (R26.81);Muscle weakness (generalized) (M62.81);Other symptoms and signs involving cognitive function   Activity Tolerance Patient tolerated treatment well   Patient Left with call bell/phone within reach;in chair;with chair alarm set   Nurse Communication Mobility status        Time: 8983-8975 OT Time Calculation (min): 8 min  Charges: OT General Charges $OT Visit: 1 Visit OT Evaluation $OT Eval Moderate Complexity: 1 Mod OT Treatments $Self Care/Home Management : 8-22 mins  Elma JONETTA Lebron FREDERICK, OTR/L [facility] Acute Rehabilitation Office: [phone]   Elma JONETTA Lebron 11/07/2024, 12:54 PM

## 2024-11-07 NOTE — Plan of Care (Signed)
" °  Problem: Fluid Volume: Goal: Ability to maintain a balanced intake and output will improve Outcome: Progressing   Problem: Health Behavior/Discharge Planning: Goal: Ability to manage health-related needs will improve Outcome: Progressing   Problem: Nutritional: Goal: Maintenance of adequate nutrition will improve Outcome: Progressing   Problem: Skin Integrity: Goal: Risk for impaired skin integrity will decrease Outcome: Progressing   Problem: Health Behavior/Discharge Planning: Goal: Ability to manage health-related needs will improve Outcome: Progressing   Problem: Clinical Measurements: Goal: Will remain free from infection Outcome: Progressing   Problem: Activity: Goal: Risk for activity intolerance will decrease Outcome: Progressing   Problem: Coping: Goal: Level of anxiety will decrease Outcome: Progressing   Problem: Elimination: Goal: Will not experience complications related to bowel motility Outcome: Progressing   Problem: Safety: Goal: Ability to remain free from injury will improve Outcome: Progressing   "

## 2024-11-07 NOTE — Progress Notes (Signed)
 " PROGRESS NOTE  [name] [name]  DOB: Aug 31, 1935  PCP: Freddrick Johns FMW:[id number]  DOA: 11/04/2024  LOS: 2 days  Hospital Day: 4  Subjective: Patient was seen and examined this morning. Pleasant elderly Caucasian male. Sitting up in recliner.  Not in distress.  Alert, awake, slow to respond but oriented x 3 Family not at bedside  Brief narrative: [name] [name] is a 89 y.o. male with PMH significant for DM2, HTN, prostate cancer s/p resection. Lives at home, ambulates with a walker sometimes. 12/26, patient was found down in a [facility] bathroom with R gaze deviation, aphasia, R facial droop with R sided weakness and neglect.  He had walked to the bathroom independently.  Staff thinks he was in the bathroom approximately 15 minutes.  On EMS arrival he was hypertensive with a systolic blood pressure greater than 220, blood glucose over 500, O2 sat 82% on room air and had right sided neglect.   In the ED patient was afebrile, hemodynamically stable, spontaneously moving extremities but not commands. Labs showed glucose level 600, creatinine 2  CT head unremarkable. CT angio head and neck did not show LVO but showed b/l carotid artery stenosis 85%R and 60%L. While at CT scan, he had seizure-like activity.  Was given Ativan  after which patient became obtunded.. 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 Long-term EEG Loaded with Keppra  and started on 500 mg IV twice daily Seizure precautions Continue to monitor telemetry   Acute metabolic  encephalopathy Related to postictal state. Mental status gradually improved.   b/l carotid artery stenosis 85%R and 60%L Vascular surgery following.  Hyperglycemic hyperosmolar state  Type 2 diabetes mellitus uncontrolled A1c 12.1 on 11/05/2024 Blood sugar level was elevated over 500 at presentation.  Serum osmolality was elevated at 335, probably precipitated seizure PTA meds-Jardiance 25 mg daily Currently on Lantus  5 units twice daily and SSI/Accu-Cheks. Continue to monitor Recent Labs  Lab 11/06/24 2035 11/06/24 2343 11/07/24 0456 11/07/24 0812 11/07/24 1134  GLUCAP 289* 230* 79 132* 274*   Pelvic fractures 12/27, CT chest abdomen pelvis showed acute comminuted intra-articular fracture of the anterior column of the right acetabulum extending into the proximal right superior pubic ramus, and a minimally displaced fracture of the mid portion of the right inferior pubic ramus, with associated small right hip hemarthrosis and mild stranding along the right anterior pelvic sidewall, without pelvic free hemorrhage. Presumably from fall Seen by orthopedics.  Recommended nonsurgical management with PRAFO offloading wedge soft boots , TDWB RLE with walker and 1-2 person assist at all times  follow up as outpatient for weightbearing pelvis films  PT/OT consulted Pain regimen --- Scheduled: Will start scheduled Tylenol  --- PRN: Oxycodone  as needed  Presumed candidal balanitis and phimosis, scrotal skin infection With phimosis, appreciate urology assistance, as long as patient is voiding, can avoid foley Given fluconazole  150 mg x 1 and Rocephin  (gonorrhea),  Started on doxycycline  100 mg BID x 7 days (possible chlamydia) Nystatin  cream to scrotum  AKI Creatinine improving with fluids. Recent Labs    11/04/24 1825 11/04/24 1827 11/05/24 0315 11/05/24 0505 11/05/24 0953 11/05/24 1444 11/06/24 1129  BUN  29* 33* 24* 23 23 21 19   CREATININE 2.00* 1.90* 1.69* 1.71* 1.73* 1.65*  1.67*  CO2 25  --  24 22 22  21* 20*    Multifocal pneumonia 12/27, CT chest abdomen pelvis showed asymmetric posterior basal right lower lobe opacity likely due to bronchopneumonia, with additional suspected scattered ill-defined ground glass opacities in both lower lobes consistent with pneumonia. Currently on IV Rocephin  and doxycycline .    Right renal mass 12/27, CT chest abdomen pelvis showed slightly heterogeneous rounded right parapelvic solid renal mass measuring 3.5 x 3.0 cm and 43 Hounsfield units, worrisome for neoplasm, with further evaluation recommended with renal protocol MRI or CT without and with contrast.  It also showed asymmetric sclerosis involving the right C7 vertebral body extending into the pedicle and articular pillar, indeterminate with metastatic disease not excluded, although Paget disease could have a similar appearance, with further evaluation recommended. Background Severe right renal atrophy with apparent renal artery embolization. Under discussed with patient today, he mentioned that he has a spot on his right kidney which was cancerous and underwent 'a procedure' which he is not able to elaborate.  I assume he has had renal artery embolization.  He follows up with urology as an outpatient  Chronic thoracic and lumbar spine compression fractures  Noted on CT scan.   Denies any prior fall and fracture.  Bladder trabeculation and diverticula which could be due to chronic bladder outlet obstruction or chronic infection.    Nutrition Status:         Mobility: Seen by PT.  SNF recommended  PT Orders: Active   PT Follow up Rec: Skilled Nursing-Short Term Rehab (<3 Hours/Day)11/07/2024 [id number]    Goals of care   Code Status: Full Code     DVT prophylaxis:  heparin  injection 5,000 Units Start: 11/05/24 0600 SCDs Start: 11/05/24 0021   Antimicrobials: Rocephin , doxycycline , nystatin  cream Fluid: None Consultants: Neurology Family Communication: None  at bedside  Status: Inpatient Level of care:  Progressive   Patient is from: Home Needs to continue in-hospital care: Ongoing workup Anticipated d/c to: SNF recommended      Diet:  Diet Order             DIET DYS 2 Room service appropriate? Yes; Fluid consistency: Thin  Diet effective now                   Scheduled Meds:  acetaminophen   1,000 mg Oral TID   doxycycline   100 mg Oral Q12H   heparin   5,000 Units Subcutaneous Q8H   insulin  aspart  0-15 Units Subcutaneous Q4H   insulin  glargine  5 Units Subcutaneous BID   levETIRAcetam   500 mg Oral BID   nystatin    Topical TID   sodium chloride  flush  3 mL Intravenous Once    PRN meds: dextrose , docusate sodium , haloperidol  lactate, labetalol , mouth rinse, oxyCODONE , polyethylene glycol   Infusions:   cefTRIAXone  (ROCEPHIN )  IV 1 g (11/06/24 2131)    Antimicrobials: Anti-infectives (From admission, onward)    Start     Dose/Rate Route Frequency Ordered Stop   11/05/24 2200  cefTRIAXone  (ROCEPHIN ) 1 g in sodium chloride  0.9 % 100 mL IVPB        1 g 200 mL/hr over 30 Minutes Intravenous Every 24 hours 11/05/24 0840 11/11/24 2159   11/05/24 2200  azithromycin  (ZITHROMAX ) 500 mg in sodium chloride  0.9 % 250 mL IVPB  Status:  Discontinued        500 mg  250 mL/hr over 60 Minutes Intravenous Daily at bedtime 11/05/24 0840 11/05/24 1118   11/05/24 1230  fluconazole  (DIFLUCAN ) tablet 150 mg        150 mg Oral  Once 11/05/24 1118 11/05/24 1454   11/05/24 1230  doxycycline  (VIBRA -TABS) tablet 100 mg        100 mg Oral Every 12 hours 11/05/24 1118 11/08/24 2159   11/04/24 2115  cefTRIAXone  (ROCEPHIN ) 1 g in sodium chloride  0.9 % 100 mL IVPB        1 g 200 mL/hr over 30 Minutes Intravenous  Once 11/04/24 2101 11/04/24 2151   11/04/24 2115  azithromycin  (ZITHROMAX ) 500 mg in sodium chloride  0.9 % 250 mL IVPB        500 mg 250 mL/hr over 60 Minutes Intravenous  Once 11/04/24 2101 11/04/24 2219       Objective: Vitals:    11/07/24 0730 11/07/24 1112  BP: (!) 147/87 (!) 161/66  Pulse: 87 88  Resp: 18 17  Temp: 98 F (36.7 C) 98.2 F (36.8 C)  SpO2: 96% 94%    Intake/Output Summary (Last 24 hours) at 11/07/2024 1309 Last data filed at 11/07/2024 1000 Gross per 24 hour  Intake 600 ml  Output 850 ml  Net -250 ml   Filed Weights   11/04/24 1827 11/07/24 0500  Weight: 78.2 kg 77.6 kg   Weight change:  There is no height or weight on file to calculate BMI.   Physical Exam: General exam: Pleasant, elderly Caucasian male.  Not in physical distress Skin: No rashes, lesions or ulcers. HEENT: Atraumatic, normocephalic, no obvious bleeding Lungs: Clear to auscultation bilaterally,  CVS: S1, S2, no murmur,   GI/Abd: Soft, nontender, nondistended, bowel sound present,   CNS: Alert, awake, slow to respond but oriented x 3 Psychiatry: Mood appropriate Extremities: No pedal edema, no calf tenderness,   Data Review: I have personally reviewed the laboratory data and studies available.  F/u labs ordered Unresulted Labs (From admission, onward)     Start     Ordered   11/05/24 0023  Blood gas, arterial  Once,   R        11/05/24 0022   11/04/24 2131  Urinalysis, Routine w reflex microscopic -Urine, Clean Catch  (Hyperglycemic Hyperosmolar State (HHS))  ONCE - STAT,   URGENT       Question Answer Comment  Obtain urine by in and out catheter if not obtained within 30 minutes of placing in a treatment room? Yes   Specimen Source Urine, Clean Catch      11/04/24 2131   Unscheduled  CBC with Differential/Platelet  Tomorrow morning,   R       Question:  Specimen collection method  Answer:  Lab=Lab collect   11/07/24 1309   Unscheduled  Basic metabolic panel with GFR  Tomorrow morning,   R       Question:  Specimen collection method  Answer:  Lab=Lab collect   11/07/24 1309            Signed, Chapman Rota, MD [facility] 11/07/2024  "

## 2024-11-07 NOTE — Evaluation (Signed)
 Occupational Therapy Evaluation Patient Details Name: [name] [name] MRN: [id number] DOB: December 22, 1934 Today's Date: 11/07/2024   History of Present Illness   Pt is an 89 y.o. male presenting 12/26 after being found down in [place] restroom with R gaze, aphasia, R weakness. CXR reported low lung volumes, patchy left basilar airspace opacities, which may reflect atelectasis or pneumonia. MRI brain negative; suspected to be s/p seizure. Found to have R superior and inferior pubic rami fx with extension into anterior column acetabulum. Per ortho notes, TDWB RLE. PMH: DM, HTN, prostate CA s/p resection.     Clinical Impressions PTA, pt living with wife and reports being independent with exception that wife lays out his medications for him daily. Upon eval, pt presents with mild RUE weakness, decreased attention to R side of environment, safety, awareness of precautions, strength, and balance. Pt needing grossly set-up to max A for BADL on evaluation and min A +2 with nearly constant cues to maintain TDWB precautions RLE. Will continue to follow acutely. Patient will benefit from continued inpatient follow up therapy, <3 hours/day      If plan is discharge home, recommend the following:   A lot of help with walking and/or transfers;Two people to help with walking and/or transfers;A lot of help with bathing/dressing/bathroom;Assistance with cooking/housework;Assist for transportation;Help with stairs or ramp for entrance     Functional Status Assessment   Patient has had a recent decline in their functional status and demonstrates the ability to make significant improvements in function in a reasonable and predictable amount of time.     Equipment Recommendations   Other (comment) (defer)     Recommendations for Other Services         Precautions/Restrictions   Precautions Precautions: Fall Recall of Precautions/Restrictions: Impaired Restrictions Weight Bearing  Restrictions Per Provider Order: Yes RLE Weight Bearing Per Provider Order: Touchdown weight bearing     Mobility Bed Mobility Overal bed mobility: Needs Assistance Bed Mobility: Supine to Sit     Supine to sit: Min assist, HOB elevated, Used rails     General bed mobility comments: assist to move RLE over EOB (to pt's left); used rail plus PTs hand to pull up to sitting    Transfers Overall transfer level: Needs assistance Equipment used: Rolling walker (2 wheels) Transfers: Sit to/from Stand Sit to Stand: +2 physical assistance, From elevated surface, Min assist           General transfer comment: Patient's rt foot placed on top of PTs  foot to monitor TDWB; pt required cues ~40% of the time to maintain TDWB while step-pivoting to the recliner      Balance Overall balance assessment: Needs assistance, History of Falls Sitting-balance support: No upper extremity supported, Feet supported Sitting balance-Leahy Scale: Fair     Standing balance support: Bilateral upper extremity supported, During functional activity, Reliant on assistive device for balance Standing balance-Leahy Scale: Poor                             ADL either performed or assessed with clinical judgement   ADL Overall ADL's : Needs assistance/impaired Eating/Feeding: Set up;Sitting;Bed level (in chair)   Grooming: Contact guard assist;Sitting   Upper Body Bathing: Contact guard assist;Sitting   Lower Body Bathing: Maximal assistance;Sit to/from stand   Upper Body Dressing : Contact guard assist;Sitting   Lower Body Dressing: Maximal assistance;Sit to/from stand   Toilet Transfer: Minimal assistance;+2 for physical assistance;+2 for  safety/equipment;Stand-pivot;Rolling walker (2 wheels)           Functional mobility during ADLs: Minimal assistance;Rolling walker (2 wheels);+2 for physical assistance;+2 for safety/equipment       Vision Baseline Vision/History: 0 No visual  deficits Patient Visual Report: No change from baseline Vision Assessment?: Vision impaired- to be further tested in functional context;Yes Tracking/Visual Pursuits: Other (comment) (decr smoothness of vertical tracking on R) Visual Fields: Impaired-to be further tested in functional context Additional Comments: decreased consistent accuracy of report in RLQ during visual fields assessment. Difficulty with vertical tracking on the R     Perception Perception: Impaired Preception Impairment Details: Inattention/Neglect Perception-Other Comments: R   Praxis         Pertinent Vitals/Pain Pain Assessment Pain Assessment: Faces Faces Pain Scale: Hurts a little bit Pain Location: rt hip Pain Descriptors / Indicators: Discomfort Pain Intervention(s): Limited activity within patient's tolerance, Monitored during session     Extremity/Trunk Assessment Upper Extremity Assessment Upper Extremity Assessment: RUE deficits/detail;Right hand dominant RUE Deficits / Details: mild weakness as compared to L; questionable proprioceptive difficulties needing cues for correct positioning on RW   Lower Extremity Assessment Lower Extremity Assessment: Defer to PT evaluation   Cervical / Trunk Assessment Cervical / Trunk Assessment: Normal   Communication Communication Communication: No apparent difficulties   Cognition Arousal: Alert Behavior During Therapy: WFL for tasks assessed/performed Cognition: Cognition impaired   Orientation impairments: Situation (was unaware of bony injuries) Awareness: Online awareness impaired, Intellectual awareness impaired Memory impairment (select all impairments): Short-term memory, Working memory (repeated commands to maintain precautions) Attention impairment (select first level of impairment): Sustained attention, Selective attention Executive functioning impairment (select all impairments): Organization, Sequencing, Problem solving                    Following commands: Impaired Following commands impaired: Follows one step commands inconsistently, Follows one step commands with increased time     Cueing  General Comments   Cueing Techniques: Verbal cues;Tactile cues      Exercises     Shoulder Instructions      Home Living Family/patient expects to be discharged to:: Private residence Living Arrangements: Spouse/significant other Available Help at Discharge: Family;Available 24 hours/day Type of Home: House Home Access: Stairs to enter [employer] of Steps: 4 Entrance Stairs-Rails: Right;Left;Can reach both Home Layout: Two level;Bed/bath upstairs Alternate Level Stairs-Number of Steps: 14 Alternate Level Stairs-Rails: Right;Left;Can reach both Bathroom Shower/Tub: Producer, Television/film/video: Standard     Home Equipment: Grab bars - tub/shower;Rollator (4 wheels)          Prior Functioning/Environment Prior Level of Function : Independent/Modified Independent;Driving             Mobility Comments: uses rollator when goes out ADLs Comments: wife lays out meds for him    OT Problem List: Decreased strength;Decreased activity tolerance;Impaired balance (sitting and/or standing);Impaired vision/perception;Decreased coordination;Decreased cognition;Decreased safety awareness;Decreased knowledge of use of DME or AE;Decreased knowledge of precautions   OT Treatment/Interventions: Self-care/ADL training;Therapeutic exercise;DME and/or AE instruction;Therapeutic activities;Visual/perceptual remediation/compensation;Patient/family education;Balance training;Cognitive remediation/compensation      OT Goals(Current goals can be found in the care plan section)   Acute Rehab OT Goals Patient Stated Goal: get better OT Goal Formulation: With patient Time For Goal Achievement: 11/21/24 Potential to Achieve Goals: Good   OT Frequency:  Min 2X/week    Co-evaluation              AM-PAC  OT 6 Clicks Daily  Activity     Outcome Measure Help from another person eating meals?: A Little Help from another person taking care of personal grooming?: A Little Help from another person toileting, which includes using toliet, bedpan, or urinal?: A Lot Help from another person bathing (including washing, rinsing, drying)?: A Lot Help from another person to put on and taking off regular upper body clothing?: A Little Help from another person to put on and taking off regular lower body clothing?: A Lot 6 Click Score: 15   End of Session Equipment Utilized During Treatment: Gait belt;Rolling walker (2 wheels) Nurse Communication: Mobility status  Activity Tolerance: Patient tolerated treatment well Patient left: with call bell/phone within reach;in chair;with chair alarm set  OT Visit Diagnosis: Unsteadiness on feet (R26.81);Muscle weakness (generalized) (M62.81);Other symptoms and signs involving cognitive function                Time: 9069-9047 OT Time Calculation (min): 22 min Charges:  OT General Charges $OT Visit: 1 Visit OT Evaluation $OT Eval Moderate Complexity: 1 Mod  Elma JONETTA Lebron FREDERICK, OTR/L [facility] Acute Rehabilitation Office: [phone]   Elma JONETTA Lebron 11/07/2024, 12:35 PM

## 2024-11-07 NOTE — Progress Notes (Signed)
 Speech Language Pathology Treatment: Dysphagia  Patient Details Name: [name] [name] MRN: [id number] DOB: Jan 04, 1935 Today's Date: 11/07/2024 Time: 0900-0920 SLP Time Calculation (min) (ACUTE ONLY): 20 min  Assessment / Plan / Recommendation Clinical Impression  Skilled therapy session focused on dysphagia goals. SLP observed patient with D1/thin liquid breakfast tray as well as D2/3 trials. Patient with timely mastication and complete oral clearance. x1 instance of delayed throat clear after D3 trials. No other s/sx of aspiration present after any consistencies tested. Recommend upgrade to D2/thin liquids and continue trials of D3 w/ SLP. Patient should receive intermittent supervision.    HPI HPI: [name] [name] is an 89 y.o. male who preasented to the hospital on 11/04/24 after being found down at a [employer] in the bathroom. He was found to have right gaze, aphasia, right facial droop, right sided weakness and right neglect. Initially 82% SpO2 on RA, improved to 99% on 6L. CTH negative for acute intracranial abnormality. MRI brain reports No acute intracranial abnormality. CXR reported low lung volumes, patchy left basilar airspace opacities, which may reflect atelectasis or pneumonia. Neurology consulted who indicated that his presentation is most consistent with an unwitnessed seizure resulting in a fall. He failed [facility] with RN and SLP swallow evaluation ordered. PMH: HTN, DM, depression, prostate cancer s/p resection.      SLP Plan  Continue with current plan of care      Recommendations  Diet recommendations: Dysphagia 2 (fine chop);Thin liquid Liquids provided via: Straw;Cup Medication Administration: Crushed with puree Supervision: Full supervision/cueing for compensatory strategies;Staff to assist with self feeding Compensations: Slow rate;Small sips/bites Postural Changes and/or Swallow Maneuvers: Seated upright 90 degrees;Upright 30-60 min after meal        Oral care  BID;Staff/trained caregiver to provide oral care   Frequent or constant Supervision/Assistance Dysphagia, unspecified (R13.10)     Continue with current plan of care      Tikesha Mort M.A., CCC-SLP 11/07/2024, 10:02 AM

## 2024-11-07 NOTE — Progress Notes (Signed)
 [facility] [place] PROGRESS NOTE  ASSESSMENT / PLAN: [name] [name] is a 89 y.o. male with asymptomatic bilateral carotid artery stenosis, right (>80%) worse than left (60%).   Recommend:  Abstinence from all tobacco products. Blood glucose control with goal A1c < 7%. Blood pressure control with goal blood pressure < 130/80 mmHg. Lipid reduction therapy with goal LDL-C < 55 mg/dL. Aspirin 81mg  by mouth daily. Clopidogrel 75mg  by mouth daily.  Atorvastatin 40-80mg  PO QD (or other high intensity statin therapy).  Thankfully MRI shows no evidence of acute stroke.  Would recommend medical treatment only for severe carotid artery stenosis.  Follow-up with me in 6 months to discuss further.  SUBJECTIVE: Sleeping comfortably.  MRI noted.  OBJECTIVE: BP (!) 147/87 (BP Location: Right Arm)   Pulse 87   Temp 98 F (36.7 C) (Oral)   Resp 18   Wt 77.6 kg   SpO2 96%   Intake/Output Summary (Last 24 hours) at 11/07/2024 1010 Last data filed at 11/07/2024 1000 Gross per 24 hour  Intake 600 ml  Output 850 ml  Net -250 ml    No acute distress Regular rate and rhythm Unlabored breathing     Latest Ref Rng & Units 11/05/2024    5:05 AM 11/05/2024    3:15 AM 11/05/2024    1:06 AM  CBC  WBC 4.0 - 10.5 K/uL 18.4  17.3    Hemoglobin 13.0 - 17.0 g/dL 86.8  86.6  87.0   Hematocrit 39.0 - 52.0 % 38.4  39.9  38.0   Platelets 150 - 400 K/uL 208  200          Latest Ref Rng & Units 11/06/2024   11:29 AM 11/05/2024    2:44 PM 11/05/2024    9:53 AM  CMP  Glucose 70 - 99 mg/dL 856  876  806   BUN 8 - 23 mg/dL 19  21  23    Creatinine 0.61 - 1.24 mg/dL 8.32  8.34  8.26   Sodium 135 - 145 mmol/L 137  141  140   Potassium 3.5 - 5.1 mmol/L 4.8  3.8  3.3   Chloride 98 - 111 mmol/L 105  107  107   CO2 22 - 32 mmol/L 20  21  22    Calcium 8.9 - 10.3 mg/dL 8.8  9.2  8.7    MRI BRAIN WITH AND WITHOUT CONTRAST 11/06/2024 02:19:36 PM   TECHNIQUE: Multiplanar  multisequence MRI of the head/brain was performed with and without the administration of intravenous contrast. 7.5 mL (gadobutrol  (GADAVIST ) 1 MMOL/ML injection 7.5 mL GADOBUTROL  1 MMOL/ML IV SOLN) was administered.   COMPARISON: CT head and CTA head and neck 11/04/2024.   CLINICAL HISTORY: Seizure, new-onset, history of trauma; Seizure, new-onset, no history of trauma.   FINDINGS:   BRAIN AND VENTRICLES: No acute infarct. No acute intracranial hemorrhage. No mass effect or midline shift. No hydrocephalus. The sella is unremarkable. Normal flow voids. No abnormal intracranial enhancement. T2 and FLAIR hyperintensity in the periventricular and subcortical white matter compatible with mild chronic microvascular ischemic changes. Mild generalized parenchymal volume loss. Symmetric appearance of the mesial temporal lobes. There is mild height loss of the hippocampi bilaterally. No signal abnormality within the hippocampi or mesial temporal lobes. Hippocampal internal architecture appears preserved. The fornices are symmetric. Small focus of encephalomalacia in the anterior inferior right temporal lobe which may reflect remote infarct versus sequelae of prior trauma.   ORBITS: No acute abnormality. Bilateral lens replacements.  Similar mild prominence of subarachnoid spaces within the bilateral optic nerve sheaths; consider correlation with symptoms and fundoscopic exam findings.   SINUSES: Mild mucosal thickening in the right frontal sinus. Small right and trace left mastoid effusions.   BONES AND SOFT TISSUES: Normal bone marrow signal and enhancement. Soft tissue along the dorsal aspect of the dens likely reflecting degenerative pannus which indents the ventral thecal sac resulting in mild indentation of the ventral cord. There is additional degenerative change and anterolisthesis at C3-C4.   IMPRESSION: 1. No acute intracranial abnormality. No abnormal enhancement. 2. Mild  prominence of subarachnoid spaces within the bilateral optic nerve sheaths. Recommend fundoscopic examination. 3. Small focus of encephalomalacia in the anterior inferior right temporal lobe, which may reflect remote infarct or sequelae of prior trauma. 4. Degenerative changes in the cervical spine. Degenerative pannus along the dens abuts the ventral cord without cord compression. Additional spinal canal stenosis at C3-4.   Electronically signed by: Donnice Mania MD 11/06/2024 03:20 PM EST RP Workstation: [id number]   Debby SAILOR. Magda, MD [facility] Phone Number: [phone] 11/07/2024 10:10 AM

## 2024-11-07 NOTE — NC FL2 (Signed)
 "   MEDICAID FL2 LEVEL OF CARE FORM     IDENTIFICATION  Patient Name: [name] [name] Birthdate: 09/12/1935 Sex: male Admission Date (Current Location): 11/04/2024  [facility] and [place] Number:  Producer, Television/film/video and Address:  [facility] [place], [address] [place], [place] [address]      Provider Number: [id number]  Attending Physician Name and Address:  Arlice Reichert, MD  Relative Name and Phone Number:       Current Level of Care: Hospital Recommended Level of Care: Skilled Nursing Facility Prior Approval Number:    Date Approved/Denied:   PASRR Number: [id number]  Discharge Plan: SNF    Current Diagnoses: Patient Active Problem List   Diagnosis Date Noted   Seizure (HCC) 11/05/2024    Orientation RESPIRATION BLADDER Height & Weight     Self, Place  Normal Continent Weight: 171 lb 1.2 oz (77.6 kg) Height:     BEHAVIORAL SYMPTOMS/MOOD NEUROLOGICAL BOWEL NUTRITION STATUS      Continent Diet (see DC summary)  AMBULATORY STATUS COMMUNICATION OF NEEDS Skin   Extensive Assist Verbally Other (Comment) (irritant contact dermititis, penis; deep tissue injury, right heel, no dressing)                       Personal Care Assistance Level of Assistance  Bathing, Feeding, Dressing Bathing Assistance: Maximum assistance Feeding assistance: Limited assistance Dressing Assistance: Maximum assistance     Functional Limitations Info             SPECIAL CARE FACTORS FREQUENCY  PT (By licensed PT), OT (By licensed OT), Speech therapy     PT Frequency: 5x/wk OT Frequency: 5x/wk     Speech Therapy Frequency: 5x/wk      Contractures Contractures Info: Not present    Additional Factors Info  Code Status, Allergies, Insulin  Sliding Scale Code Status Info: Full Allergies Info: NKA   Insulin  Sliding Scale Info: see DC summary       Current Medications (11/07/2024):  This is the current hospital active medication  list Current Facility-Administered Medications  Medication Dose Route Frequency Provider Last Rate Last Admin   acetaminophen  (TYLENOL ) tablet 1,000 mg  1,000 mg Oral TID Dahal, Binaya, MD       cefTRIAXone  (ROCEPHIN ) 1 g in sodium chloride  0.9 % 100 mL IVPB  1 g Intravenous Q24H Hunsucker, Donnice SAUNDERS, MD 200 mL/hr at 11/06/24 2131 1 g at 11/06/24 2131   dextrose  50 % solution 0-50 mL  0-50 mL Intravenous PRN Lenor Hollering, MD       docusate sodium  (COLACE) capsule 100 mg  100 mg Oral BID PRN Gleason, Laura R, PA-C       doxycycline  (VIBRA -TABS) tablet 100 mg  100 mg Oral Q12H Hunsucker, Donnice SAUNDERS, MD   100 mg at 11/07/24 1046   haloperidol  lactate (HALDOL ) injection 2 mg  2 mg Intravenous Q6H PRN Hunsucker, Donnice SAUNDERS, MD   2 mg at 11/06/24 0051   heparin  injection 5,000 Units  5,000 Units Subcutaneous Q8H Gleason, Laura R, PA-C   5,000 Units at 11/07/24 9392   insulin  aspart (novoLOG ) injection 0-15 Units  0-15 Units Subcutaneous Q4H Gleason, Laura R, PA-C   5 Units at 11/07/24 0016   insulin  glargine (LANTUS ) injection 5 Units  5 Units Subcutaneous BID Gleason, Laura R, PA-C   5 Units at 11/07/24 1046   labetalol  (NORMODYNE ) injection 2.5 mg  2.5 mg Intravenous Q2H PRN Gleason, Leita SAUNDERS, PA-C  levETIRAcetam  (KEPPRA ) tablet 500 mg  500 mg Oral BID Chen, Lydia D, RPH   500 mg at 11/07/24 1048   living well with diabetes book MISC   Does not apply Once Dahal, Chapman, MD       nystatin  (MYCOSTATIN /NYSTOP ) topical powder   Topical TID Hunsucker, Donnice SAUNDERS, MD   Given at 11/07/24 1049   Oral care mouth rinse  15 mL Mouth Rinse PRN Maree Harder, MD       oxyCODONE  (Oxy IR/ROXICODONE ) immediate release tablet 5 mg  5 mg Oral Q6H PRN Dahal, Binaya, MD       polyethylene glycol (MIRALAX  / GLYCOLAX ) packet 17 g  17 g Oral Daily PRN Gleason, Laura R, PA-C       sodium chloride  flush (NS) 0.9 % injection 3 mL  3 mL Intravenous Once Belfi, Melanie, MD         Discharge Medications: Please see  discharge summary for a list of discharge medications.  Relevant Imaging Results:  Relevant Lab Results:   Additional Information SS#: [id number]  Almarie CHRISTELLA Goodie, LCSW     "

## 2024-11-07 NOTE — Inpatient Diabetes Management (Addendum)
 Inpatient Diabetes Program Recommendations  AACE/ADA: New Consensus Statement on Inpatient Glycemic Control (2015)  Target Ranges:  Prepandial:   less than 140 mg/dL      Peak postprandial:   less than 180 mg/dL (1-2 hours)      Critically ill patients:  140 - 180 mg/dL   Lab Results  Component Value Date   GLUCAP 274 (H) 11/07/2024   HGBA1C 12.1 (H) 11/05/2024    Latest Reference Range & Units 11/06/24 11:21 11/06/24 17:17 11/06/24 20:35 11/06/24 23:43 11/07/24 04:56 11/07/24 08:12 11/07/24 11:34  Glucose-Capillary 70 - 99 mg/dL 884 (H) 802 (H) 710 (H) 230 (H) 79 132 (H) 274 (H)   Review of Glycemic Control  Diabetes history: DM2  Outpatient Diabetes medications:  Jardiance 25mg  daily   Current orders for Inpatient glycemic control:  Lantus  5 units BID  Novolog  0-15 units Q4HRS   Inpatient Diabetes Program Recommendations:   Spoke with patient about diabetes at bedside.Patient reports being followed by Dr Ilah for diabetes management. Patient states he was checking his blood glucose every morning; however, the past year he stopped checking. He denies taking insulin  for DM control but has taken Ozempic in the past.  Inquired about prior A1C and patient reports not being able to recall last A1C value. Discussed A1C results of 12.1% and explained that current A1C indicates an average glucose of 301 mg/dl over the past 2-3 months. Discussed glucose and A1C goals. Discussed importance of checking CBGs and maintaining good CBG control to prevent long-term and short-term complications. Reviewed signs and symptoms of hyperglycemia and hypoglycemia along with treatment for both. Discussed impact of nutrition, stress, sickness, and medications on diabetes control. Patient states he drinks regular sodas and juice daily. He is aware he should switch to diet or sugar free options.  Ordered Living Well with diabetes booklet and encouraged patient to read through entire book. Asked patient to check  his glucose 4 times per day (before meals and at bedtime) and to keep a log book of glucose readings. Explained how the doctor he follows up with can use the log book to continue to make medication adjustments if needed.  Briefly educated patient on insulin  pen use at home in case he is discharged on insulin . Reviewed all steps of insulin  pen including attachment of needle, 2-unit air shot, dialing up dose, giving injection, removing needle, disposal of sharps, storage of unused insulin , disposal of insulin  etc. Also reviewed troubleshooting with insulin  pen. Patient states his wife would also be helpful when learning diabetes information/insulin  administration; however, she does not drive and has not been able to visit him.   Patient verbalized understanding of information discussed and reports no further questions at this time related to diabetes.   Thanks,  Lavanda Search, RN, MSN, [facility]  Inpatient Diabetes Coordinator  Pager [phone] (8a-5p)

## 2024-11-07 NOTE — Plan of Care (Signed)
  Problem: Safety: Goal: Non-violent Restraint(s) Outcome: Progressing   Problem: Education: Goal: Ability to describe self-care measures that may prevent or decrease complications (Diabetes Survival Skills Education) will improve Outcome: Progressing

## 2024-11-07 NOTE — Progress Notes (Signed)
 NEUROLOGY CONSULT FOLLOW UP NOTE   Date of service: November 07, 2024 Patient Name: [name] [name] MRN:  [id number] DOB:  1935/04/12  Interval Hx/subjective  Patient is seen in his room with no family at the bedside.  He has been hemodynamically stable and afebrile overnight and is much more alert and oriented than he was on admission.  Vitals   Vitals:   11/06/24 2345 11/07/24 0500 11/07/24 0505 11/07/24 0730  BP: (!) 172/65  (!) 146/60 (!) 147/87  Pulse: 94  86 87  Resp: 18  16 18   Temp: 98.8 F (37.1 C)  97.9 F (36.6 C) 98 F (36.7 C)  TempSrc:   Oral Oral  SpO2: 98%  95% 96%  Weight:  77.6 kg       There is no height or weight on file to calculate BMI.  Physical Exam   Constitutional: Appears well-developed and well-nourished.  Psych: Affect appropriate to situation Eyes: No scleral injection.  HENT: No OP obstruction.  Moist mucous membranes.  Reddened sclera to left eye Head: Normocephalic.  Respiratory: Effort normal, non-labored breathing.  Skin: No rash seen to face or distal extremities bilaterally.    Neurologic Examination    NEURO:  Mental Status: AA&Ox3  Speech/Language: speech is without dysarthria or aphasia.  Fluency, and comprehension intact.  Cranial Nerves:  II: PERRL.  III, IV, VI: EOMI. Eyelids elevate symmetrically.  V: Sensation is intact to light touch and symmetrical to face.  VII: Smile is symmetrical.  VIII: hearing intact to voice. IX, X: Palate elevates symmetrically. Phonation is normal.  KP:Dynloizm shrug 5/5. XII: tongue is midline without fasciculations. Motor: Able to move all 4 extremities with antigravity strength, left leg slightly stronger than right Tone: is normal and bulk is normal Sensation- Intact to light touch bilaterally but slightly diminished in the right lower extremity.   Coordination: FTN with questionable ataxia versus mild tremor bilaterally Gait- deferred   Medications Current Medications[1]  Labs  and Diagnostic Imaging   CBC:  Recent Labs  Lab 11/04/24 1825 11/04/24 1827 11/05/24 0315 11/05/24 0505  WBC 12.1*  --  17.3* 18.4*  NEUTROABS 7.0  --   --   --   HGB 15.2   < > 13.3 13.1  HCT 44.9   < > 39.9 38.4*  MCV 91.4  --  93.0 92.3  PLT 271  --  200 208   < > = values in this interval not displayed.    Basic Metabolic Panel:  Lab Results  Component Value Date   NA 137 11/06/2024   K 4.8 11/06/2024   CO2 20 (L) 11/06/2024   GLUCOSE 143 (H) 11/06/2024   BUN 19 11/06/2024   CREATININE 1.67 (H) 11/06/2024   CALCIUM 8.8 (L) 11/06/2024   GFRNONAA 39 (L) 11/06/2024   Lipid Panel: No results found for: LDLCALC HgbA1c:  Lab Results  Component Value Date   HGBA1C 12.1 (H) 11/05/2024   Urine Drug Screen:     Component Value Date/Time   LABOPIA NEGATIVE 11/05/2024 0021   COCAINSCRNUR NEGATIVE 11/05/2024 0021   LABBENZ NEGATIVE 11/05/2024 0021   AMPHETMU NEGATIVE 11/05/2024 0021   THCU NEGATIVE 11/05/2024 0021   LABBARB NEGATIVE 11/05/2024 0021    Alcohol Level     Component Value Date/Time   [facility] <15 11/04/2024 1822   INR  Lab Results  Component Value Date   INR 1.0 11/04/2024   APTT  Lab Results  Component Value Date   APTT 22 (L) 11/04/2024  MRI brain: No acute abnormality, mild prominence of subarachnoid spaces within bilateral optic nerve sheaths, small focus of encephalomalacia in anterior inferior right temporal lobe  Assessment  [name] [name] is an 89 y.o. male with a PMHx of HTN, DM with frequent hypoglycemic episodes, partial medication compliance, depression, and prostate cancer s/p resection who presented on Friday after being found down at [facility] in the bathroom. Staff think he was in there for 15 minutes. Found with right gaze, aphasia, right facial froop, right sided weakness, and right neglect. No seizure activity reported by EMS. BP 226/101, CBG 570, Initially 82% on room air and then 99% on 6L. On arrival to the ED, he was stuporous,  nonverbal and not following commands, with right sided gaze, head turned to the right and some intermittent left sided movement, but flaccid RUE and RLE. No seizure activity seen immediately on arrival, but intermittent subtle right sided jerking was present in CT. He was loaded with Keppra  after which no further seizure activity was seen. He was admitted to the ICU for management of his severe hyperglycemia.  - Exam reveals a confused patient with mild psychomotor agitation. No clinical seizure activity appreciated.   - CT head: No acute intracranial abnormality. ASPECTS 10. - CTA of head and neck: Mixed atherosclerotic plaque at the right carotid bifurcation with approximately 85% stenosis at the origin of the right cervical ICA. Mixed atherosclerotic plaque at the left carotid bifurcation with approximately 60% stenosis at the origin of the left cervical ICA. Severe stenosis at the origin of the nondominant left vertebral artery. Atherosclerosis of the bilateral carotid siphons with moderate stenosis of the bilateral cavernous and paraophthalmic ICAs. 2 mm inferiorly directed outpouching along the left supraclinoid ICA, possibly a small aneurysm versus infundibulum at the origin of the posterior communicating artery. Additional 2 mm inferiorly directed outpouching at the P1 and P2 junction of the left PCA, concerning for a small aneurysm.  -MRI brain demonstrates no acute infarct but does show a focus of encephalomalacia in the anterior inferior right temporal lobe which could be related to an old infarct and could be a seizure focus. - LTM EEG: Continuous slow, left fronto-temporal region. This study is suggestive of cortical dysfunction arising from  left fronto-temporal region likely secondary to underlying structural abnormality, post-ictal state. No seizures or epileptiform discharges were seen throughout the recording. -Vascular surgery has seen patient for bilateral ICA stenosis but is unlikely to  intervene given normal MRI and the fact that stenoses are asymptomatic. 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  - Severe atherosclerotic stenoses of the bilateral ICAs - Poorly controlled diabetes   Recommendations  - Blood sugar management per primary team - OOB to chair when possible - Encourage PO hydration  - Cardiac telemetry and continuous pulse oximetry with alarm - Inpatient seizure precautions - Outpatient seizure precautions: Per [place] statutes, patients with seizures are not allowed to drive until  they have been seizure-free for six months. Use caution when using heavy equipment or power tools. Avoid working on ladders or at heights. Take showers instead of baths. Ensure the water temperature is not too high on the home water heater. Do not go swimming alone. When caring for infants or small children, sit down when holding, feeding, or changing them to minimize risk of injury to the  child in the event you have a seizure. Also, Maintain good sleep hygiene. Avoid alcohol.   ______________________________________________________________________  Patient seen by NP and then by MD, MD to addend note as needed. Cortney E Everitt Clint Kill , MSN, AGACNP-BC [facility] See Amion for schedule and pager information 11/07/2024 7:57 AM   I have seen the patient and reviewed the above note.  He does have some right leg weakness and tells me that this has been ongoing and problematic for many years without significant progression.  I am not sure how much an MRI of his cervical spine would change her management at the current time, but an MRI of his cervical spine could be considered at a later date if he were to have significant gait abnormality, etc.  At 89, he type of surgical intervention  would have to be considered very delicately.  This is his first ever seizure and it occurred in the setting of severe hyperglycemia, so even though there is an area of encephalomalacia, given that it could be a provoked seizure and his advanced age I am not sure I would favor starting an antiepileptic at this time.  He does drive and I discussed that he is not allowed to drive for 6 months per [place]  state law and he expresses understanding.  Aisha Seals, MD [facility]   If 7pm- 7am, please page neurology on call as listed in AMION.     [1]  Current Facility-Administered Medications:    cefTRIAXone  (ROCEPHIN ) 1 g in sodium chloride  0.9 % 100 mL IVPB, 1 g, Intravenous, Q24H, Hunsucker, Donnice SAUNDERS, MD, Last Rate: 200 mL/hr at 11/06/24 2131, 1 g at 11/06/24 2131   Chlorhexidine  Gluconate Cloth 2 % PADS 6 each, 6 each, Topical, Daily, Gleason, Leita SAUNDERS, PA-C, 6 each at 11/06/24 1047   Chlorhexidine  Gluconate Cloth 2 % PADS 6 each, 6 each, Topical, Q0600, Maree Harder, MD, 6 each at 11/06/24 0603   dextrose  50 % solution 0-50 mL, 0-50 mL, Intravenous, PRN, Belfi, Melanie, MD   docusate sodium  (COLACE) capsule 100 mg, 100 mg, Oral, BID PRN, Gleason, Leita SAUNDERS, PA-C   doxycycline  (VIBRA -TABS) tablet 100 mg, 100 mg, Oral, Q12H, Hunsucker, Donnice SAUNDERS, MD, 100 mg at 11/06/24 2118   fentaNYL  (SUBLIMAZE ) injection 50 mcg, 50 mcg, Intravenous, Q4H PRN, Hunsucker, Donnice SAUNDERS, MD, 50 mcg at 11/06/24 1514   haloperidol  lactate (HALDOL ) injection 2 mg, 2 mg, Intravenous, Q6H PRN, Hunsucker, Donnice SAUNDERS, MD, 2 mg at 11/06/24 0051   heparin  injection 5,000 Units, 5,000 Units, Subcutaneous, Q8H, Gleason, Leita SAUNDERS, PA-C, 5,000 Units at 11/07/24 0607   insulin  aspart (novoLOG ) injection 0-15 Units, 0-15 Units, Subcutaneous, Q4H, Gleason, Leita SAUNDERS, PA-C, 5 Units at 11/07/24 0016   insulin  glargine (LANTUS ) injection 5 Units, 5 Units, Subcutaneous, BID, Gleason, Laura R, PA-C, 5 Units at 11/06/24  2121   labetalol  (NORMODYNE ) injection 2.5 mg, 2.5 mg, Intravenous, Q2H PRN, Gleason, Leita SAUNDERS, PA-C   levETIRAcetam  (KEPPRA ) tablet 500 mg, 500 mg, Oral, BID, Chen, Lydia D, RPH, 500 mg at 11/06/24 2118   nystatin  (MYCOSTATIN /NYSTOP ) topical powder, , Topical, TID, Hunsucker, Donnice SAUNDERS, MD, Given at 11/06/24 2132   Oral care mouth rinse, 15 mL, Mouth Rinse, PRN, Maree Harder, MD   polyethylene glycol (MIRALAX  / GLYCOLAX ) packet 17 g, 17 g, Oral, Daily PRN, Gleason, Leita SAUNDERS, PA-C   sodium chloride  flush (NS) 0.9 % injection 3 mL, 3 mL, Intravenous, Once, Lenor Hollering, MD

## 2024-11-07 NOTE — TOC Initial Note (Signed)
 Transition of Care [facility]) - Initial/Assessment Note    Patient Details  Name: [name] [name] MRN: [id number] Date of Birth: [birth year]  [facility]) CM/SW Contact:    Almarie CHRISTELLA Goodie, LCSW Phone Number: 11/07/2024, 2:28 PM  Clinical Narrative:   CSW spoke with patient's spouse, Sandrea, to discuss recommendation for SNF. Spouse in agreement, preference for somewhere closer to home in [place]. CSW explained referral process and CMS choice list, and spouse indicated understanding. CSW completed referral and faxed out, awaiting responses. CSW to follow.                Expected Discharge Plan: Skilled Nursing Facility Barriers to Discharge: Continued Medical Work up, English As A Second Language Teacher   Patient Goals and CMS Choice Patient states their goals for this hospitalization and ongoing recovery are:: patient unable to participate in goal setting, not fully oriented CMS Medicare.gov Compare Post Acute Care list provided to:: Patient Represenative (must comment) Choice offered to / list presented to : Spouse [place] ownership interest in [facility].provided to:: Spouse    Expected Discharge Plan and Services     Post Acute Care Choice: Skilled Nursing Facility Living arrangements for the past 2 months: Single Family Home                                      Prior Living Arrangements/Services Living arrangements for the past 2 months: Single Family Home Lives with:: Spouse Patient language and need for interpreter reviewed:: No Do you feel safe going back to the place where you live?: Yes      Need for Family Participation in Patient Care: Yes (Comment) Care giver support system in place?: No (comment)   Criminal Activity/Legal Involvement Pertinent to Current Situation/Hospitalization: No - Comment as needed  Activities of Daily Living   ADL Screening (condition at time of admission) Independently performs ADLs?: Yes (appropriate for  developmental age) Is the patient deaf or have difficulty hearing?: No Does the patient have difficulty seeing, even when wearing glasses/contacts?: No Does the patient have difficulty concentrating, remembering, or making decisions?: No  Permission Sought/Granted Permission sought to share information with : Facility Medical Sales Representative, Family Supports Permission granted to share information with : Yes, Verbal Permission Granted  Share Information with NAME: Sandrea Cramp  Permission granted to share info w AGENCY: SNF  Permission granted to share info w Relationship: Spouse, Daughter     Emotional Assessment   Attitude/Demeanor/Rapport: Unable to Assess Affect (typically observed): Unable to Assess Orientation: : Oriented to Self, Oriented to Place Alcohol / Substance Use: Not Applicable Psych Involvement: No (comment)  Admission diagnosis:  Seizure (HCC) [R56.9] Acute respiratory failure with hypoxia (HCC) [J96.01] Altered mental status, unspecified altered mental status type [R41.82] Community acquired pneumonia of left lower lobe of lung [J18.9] Patient Active Problem List   Diagnosis Date Noted   Seizure (HCC) 11/05/2024   PCP:  Freddrick, No Pharmacy:   [id number] [address] [place], [place] - [email or web address] AT [facility] [address] [place] [address] Phone: [phone] Fax: [phone]     Social Drivers of Health (SDOH) Social History: SDOH Screenings   Food Insecurity: No Food Insecurity (11/06/2024)  Housing: Low Risk (11/06/2024)  Transportation Needs: No Transportation Needs (11/06/2024)  Utilities: Patient Declined (11/06/2024)  Social Connections: Socially Integrated (11/06/2024)   SDOH Interventions:  Readmission Risk Interventions     No data to display

## 2024-11-08 DIAGNOSIS — R569 Unspecified convulsions: Secondary | ICD-10-CM | POA: Diagnosis not present

## 2024-11-08 DIAGNOSIS — G9389 Other specified disorders of brain: Secondary | ICD-10-CM | POA: Diagnosis not present

## 2024-11-08 DIAGNOSIS — G40109 Localization-related (focal) (partial) symptomatic epilepsy and epileptic syndromes with simple partial seizures, not intractable, without status epilepticus: Secondary | ICD-10-CM | POA: Diagnosis not present

## 2024-11-08 DIAGNOSIS — I10 Essential (primary) hypertension: Secondary | ICD-10-CM | POA: Diagnosis not present

## 2024-11-08 DIAGNOSIS — E1165 Type 2 diabetes mellitus with hyperglycemia: Secondary | ICD-10-CM | POA: Diagnosis not present

## 2024-11-08 LAB — CBC WITH DIFFERENTIAL/PLATELET
Abs Immature Granulocytes: 0.05 K/uL (ref 0.00–0.07)
Basophils Absolute: 0 K/uL (ref 0.0–0.1)
Basophils Relative: 0 %
Eosinophils Absolute: 0.3 K/uL (ref 0.0–0.5)
Eosinophils Relative: 3 %
HCT: 35.4 % — ABNORMAL LOW (ref 39.0–52.0)
Hemoglobin: 11.8 g/dL — ABNORMAL LOW (ref 13.0–17.0)
Immature Granulocytes: 1 %
Lymphocytes Relative: 16 %
Lymphs Abs: 1.3 K/uL (ref 0.7–4.0)
MCH: 30.8 pg (ref 26.0–34.0)
MCHC: 33.3 g/dL (ref 30.0–36.0)
MCV: 92.4 fL (ref 80.0–100.0)
Monocytes Absolute: 0.6 K/uL (ref 0.1–1.0)
Monocytes Relative: 8 %
Neutro Abs: 5.8 K/uL (ref 1.7–7.7)
Neutrophils Relative %: 72 %
Platelets: 189 K/uL (ref 150–400)
RBC: 3.83 MIL/uL — ABNORMAL LOW (ref 4.22–5.81)
RDW: 13.3 % (ref 11.5–15.5)
WBC: 8 K/uL (ref 4.0–10.5)
nRBC: 0 % (ref 0.0–0.2)

## 2024-11-08 LAB — BASIC METABOLIC PANEL WITH GFR
Anion gap: 9 (ref 5–15)
BUN: 26 mg/dL — ABNORMAL HIGH (ref 8–23)
CO2: 25 mmol/L (ref 22–32)
Calcium: 8.8 mg/dL — ABNORMAL LOW (ref 8.9–10.3)
Chloride: 102 mmol/L (ref 98–111)
Creatinine, Ser: 1.83 mg/dL — ABNORMAL HIGH (ref 0.61–1.24)
GFR, Estimated: 35 mL/min — ABNORMAL LOW
Glucose, Bld: 111 mg/dL — ABNORMAL HIGH (ref 70–99)
Potassium: 4.4 mmol/L (ref 3.5–5.1)
Sodium: 136 mmol/L (ref 135–145)

## 2024-11-08 LAB — GLUCOSE, CAPILLARY
Glucose-Capillary: 115 mg/dL — ABNORMAL HIGH (ref 70–99)
Glucose-Capillary: 120 mg/dL — ABNORMAL HIGH (ref 70–99)
Glucose-Capillary: 135 mg/dL — ABNORMAL HIGH (ref 70–99)
Glucose-Capillary: 149 mg/dL — ABNORMAL HIGH (ref 70–99)
Glucose-Capillary: 161 mg/dL — ABNORMAL HIGH (ref 70–99)
Glucose-Capillary: 175 mg/dL — ABNORMAL HIGH (ref 70–99)
Glucose-Capillary: 99 mg/dL (ref 70–99)

## 2024-11-08 MED ADMIN — Insulin Glargine Inj 100 Unit/ML: 5 [IU] | SUBCUTANEOUS | NDC 00088222033

## 2024-11-08 MED ADMIN — Insulin Aspart Inj Soln 100 Unit/ML: 2 [IU] | SUBCUTANEOUS | NDC 73070010011

## 2024-11-08 MED ADMIN — Insulin Aspart Inj Soln 100 Unit/ML: 3 [IU] | SUBCUTANEOUS | NDC 73070010011

## 2024-11-08 MED ADMIN — CEFTRIAXONE  1 GM IVPB MIXTURE: 1 g | INTRAVENOUS | NDC 00143985701

## 2024-11-08 MED ADMIN — Doxycycline Hyclate Tab 100 MG: 100 mg | ORAL | NDC 24658031001

## 2024-11-08 MED ADMIN — Heparin Sodium (Porcine) Inj 5000 Unit/ML: 5000 [IU] | SUBCUTANEOUS | NDC 72572025501

## 2024-11-08 MED ADMIN — Acetaminophen Tab 500 MG: 1000 mg | ORAL | NDC 50580045711

## 2024-11-08 NOTE — Progress Notes (Signed)
 " PROGRESS NOTE  [name] [name]  DOB: June 07, 1935  PCP: Freddrick Johns FMW:[id number]  DOA: 11/04/2024  LOS: 3 days  Hospital Day: 5  Subjective: Patient was seen and examined this morning.  Sitting up at the edge of the bed.  Not in distress.  No new symptoms.  Family not at bedside. Afebrile, blood pressure in 160s 170s, breathing on room air. Labs with WBC count 8, BUN/creatinine 26/1.83  Brief narrative: [name] [name] is a 89 y.o. male with PMH significant for DM2, HTN, prostate cancer s/p resection. Lives at home, ambulates with a walker sometimes. 12/26, patient was found down in a [facility] bathroom with R gaze deviation, aphasia, R facial droop with R sided weakness and neglect.  He had walked to the bathroom independently.  Staff thinks he was in the bathroom approximately 15 minutes.  On EMS arrival he was hypertensive with a systolic blood pressure greater than 220, blood glucose over 500, O2 sat 82% on room air and had right sided neglect.   In the ED patient was afebrile, hemodynamically stable, spontaneously moving extremities but not commands. Labs showed glucose level 600, creatinine 2  CT head unremarkable. CT angio head and neck did not show LVO but showed b/l carotid artery stenosis 85%R and 60%L. While at CT scan, he had seizure-like activity.  Was given Ativan  after which patient became obtunded.. 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 Long-term EEG Loaded with Keppra  and started on 500 mg IV twice daily Seizure precautions advised  including not to drive for a period of 6 months at least Continue to monitor telemetry   Acute metabolic encephalopathy Related to postictal state. Mental status gradually improved.   b/l carotid artery stenosis 85%R and 60%L Vascular surgery consult obtained.  Medical management recommended  Hyperglycemic hyperosmolar state  Type 2 diabetes mellitus uncontrolled A1c 12.1 on 11/05/2024 Blood sugar level was elevated over 500 at presentation.  Serum osmolality was elevated at 335, probably precipitated seizure Diabetes care coordinator consulted.  Patient follows up with Dr. Ilah for diabetes management.  Was on Ozempic in the past.  Not on insulin .  Has been recently on Jardiance 25 mg daily only. Currently on Lantus  5 units twice daily and SSI/Accu-Cheks. Continue to monitor Recent Labs  Lab 11/07/24 2025 11/08/24 0033 11/08/24 0453 11/08/24 0749 11/08/24 1141  GLUCAP 213* 115* 175* 135* 120*   Pelvic fractures 12/27, CT chest abdomen pelvis showed acute comminuted intra-articular fracture of the anterior column of the right acetabulum extending into the proximal right superior pubic ramus, and a minimally displaced fracture of the mid portion of the right inferior pubic ramus, with associated small right hip hemarthrosis and mild stranding along the right anterior pelvic sidewall, without pelvic free hemorrhage. Presumably from fall Seen by orthopedics.  Recommended nonsurgical management with PRAFO offloading wedge soft boots , TDWB RLE with walker and 1-2 person assist at all times  follow up as outpatient for weightbearing pelvis films  PT/OT consulted Pain regimen --- Scheduled: Tylenol  1 g 3 times daily --- PRN: Oxycodone  as needed  Presumed candidal balanitis and phimosis, scrotal skin infection With phimosis, appreciate  urology assistance, as long as patient is voiding, can avoid foley Given fluconazole  150 mg x 1 and Rocephin  (gonorrhea),  Started on doxycycline  100  mg BID x 7 days (possible chlamydia) Nystatin  cream to scrotum  AKI Creatinine gradually improved with fluids, up in the last 24 hours.  Continue to monitor Recent Labs    11/04/24 1825 11/04/24 1827 11/05/24 0315 11/05/24 0505 11/05/24 0953 11/05/24 1444 11/06/24 1129 11/08/24 0133  BUN 29* 33* 24* 23 23 21 19  26*  CREATININE 2.00* 1.90* 1.69* 1.71* 1.73* 1.65* 1.67* 1.83*  CO2 25  --  24 22 22  21* 20* 25    Multifocal pneumonia 12/27, CT chest abdomen pelvis showed asymmetric posterior basal right lower lobe opacity likely due to bronchopneumonia, with additional suspected scattered ill-defined ground glass opacities in both lower lobes consistent with pneumonia. Currently on IV Rocephin  and doxycycline .    Right renal mass 12/27, CT chest abdomen pelvis showed slightly heterogeneous rounded right parapelvic solid renal mass measuring 3.5 x 3.0 cm and 43 Hounsfield units, worrisome for neoplasm, with further evaluation recommended with renal protocol MRI or CT without and with contrast.  It also showed asymmetric sclerosis involving the right C7 vertebral body extending into the pedicle and articular pillar, indeterminate with metastatic disease not excluded, although Paget disease could have a similar appearance, with further evaluation recommended. Background Severe right renal atrophy with apparent renal artery embolization. On my discussion with patient on 12/29, he mentioned that he has a spot on his right kidney which was cancerous and underwent 'a procedure' which he is not able to elaborate.  I assume he has had renal artery embolization.  He follows up with urology as an outpatient  Chronic thoracic and lumbar spine compression fractures  Noted on CT scan.   Denies any prior fall and fracture.  Bladder trabeculation and diverticula which could be due to chronic bladder outlet obstruction or chronic infection.    Nutrition Status:         Mobility: Seen by PT.  SNF  recommended  PT Orders: Active   PT Follow up Rec: Skilled Nursing-Short Term Rehab (<3 Hours/Day)11/07/2024 [phone]    Goals of care   Code Status: Full Code     DVT prophylaxis:  heparin  injection 5,000 Units Start: 11/05/24 0600 SCDs Start: 11/05/24 0021   Antimicrobials: Rocephin , doxycycline , nystatin  cream Fluid: None Consultants: Neurology Family Communication: None at bedside  Status: Inpatient Level of care:  Progressive   Patient is from: Home Needs to continue in-hospital care: Stable for discharge.  Pending SNF    Diet:  Diet Order             DIET DYS 2 Room service appropriate? No; Fluid consistency: Thin  Diet effective now                   Scheduled Meds:  acetaminophen   1,000 mg Oral TID   heparin   5,000 Units Subcutaneous Q8H   insulin  aspart  0-15 Units Subcutaneous Q4H   insulin  glargine  5 Units Subcutaneous BID   nystatin    Topical TID   sodium chloride  flush  3 mL Intravenous Once    PRN meds: dextrose , docusate sodium , haloperidol  lactate, labetalol , mouth rinse, oxyCODONE , polyethylene glycol   Infusions:   cefTRIAXone  (ROCEPHIN )  IV 1 g (11/07/24 2226)    Antimicrobials: Anti-infectives (From admission, onward)    Start     Dose/Rate Route Frequency Ordered Stop   11/05/24 2200  cefTRIAXone  (ROCEPHIN )  1 g in sodium chloride  0.9 % 100 mL IVPB        1 g 200 mL/hr over 30 Minutes Intravenous Every 24 hours 11/05/24 0840 11/11/24 2159   11/05/24 2200  azithromycin  (ZITHROMAX ) 500 mg in sodium chloride  0.9 % 250 mL IVPB  Status:  Discontinued        500 mg 250 mL/hr over 60 Minutes Intravenous Daily at bedtime 11/05/24 0840 11/05/24 1118   11/05/24 1230  fluconazole  (DIFLUCAN ) tablet 150 mg        150 mg Oral  Once 11/05/24 1118 11/05/24 1454   11/05/24 1230  doxycycline  (VIBRA -TABS) tablet 100 mg        100 mg Oral Every 12 hours 11/05/24 1118 11/08/24 0838   11/04/24 2115  cefTRIAXone  (ROCEPHIN ) 1 g in sodium chloride  0.9  % 100 mL IVPB        1 g 200 mL/hr over 30 Minutes Intravenous  Once 11/04/24 2101 11/04/24 2151   11/04/24 2115  azithromycin  (ZITHROMAX ) 500 mg in sodium chloride  0.9 % 250 mL IVPB        500 mg 250 mL/hr over 60 Minutes Intravenous  Once 11/04/24 2101 11/04/24 2219       Objective: Vitals:   11/08/24 0733 11/08/24 1138  BP: (!) 176/85 (!) 153/70  Pulse: 85 85  Resp: 14 18  Temp: (!) 97.5 F (36.4 C) 98 F (36.7 C)  SpO2: 100% 95%    Intake/Output Summary (Last 24 hours) at 11/08/2024 1255 Last data filed at 11/08/2024 0848 Gross per 24 hour  Intake 400 ml  Output 1250 ml  Net -850 ml   Filed Weights   11/04/24 1827 11/07/24 0500 11/08/24 0500  Weight: 78.2 kg 77.6 kg 75.5 kg   Weight change: -2.1 kg There is no height or weight on file to calculate BMI.   Physical Exam: General exam: Pleasant, elderly Caucasian male.  Not in physical distress Skin: No rashes, lesions or ulcers. HEENT: Atraumatic, normocephalic, no obvious bleeding Lungs: Clear to auscultation bilaterally,  CVS: S1, S2, no murmur,   GI/Abd: Soft, nontender, nondistended, bowel sound present,   CNS: Alert, awake, slow to respond but oriented x 3 Psychiatry: Mood appropriate Extremities: No pedal edema, no calf tenderness,   Data Review: I have personally reviewed the laboratory data and studies available.  F/u labs ordered Unresulted Labs (From admission, onward)     Start     Ordered   11/04/24 2131  Urinalysis, Routine w reflex microscopic -Urine, Clean Catch  (Hyperglycemic Hyperosmolar State (HHS))  ONCE - STAT,   URGENT       Question Answer Comment  Obtain urine by in and out catheter if not obtained within 30 minutes of placing in a treatment room? Yes   Specimen Source Urine, Clean Catch      11/04/24 2131            Signed, Chapman Rota, MD [facility] 11/08/2024  "

## 2024-11-08 NOTE — Care Management Important Message (Signed)
 Important Message  Patient Details  Name: [name] [name] MRN: [id number] Date of Birth: Nov 12, 1934   Important Message Given:  Yes - Medicare IM     [name] 11/08/2024, 3:17 PM

## 2024-11-08 NOTE — Progress Notes (Signed)
 Subjective: Did well overnight, no concerns for seizures  Exam: Vitals:   11/08/24 0349 11/08/24 0733  BP: (!) 172/74 (!) 176/85  Pulse: 82 85  Resp:  14  Temp: 98.1 F (36.7 C) (!) 97.5 F (36.4 C)  SpO2: 96% 100%   Gen: In bed, NAD Resp: non-labored breathing, no acute distress Abd: soft, nt  Neuro: MS: Awake, alert, oriented(initially states he thinks it is January now, but then corrects himself to still December) CN: EOMI, pupils reactive bilaterally, face symmetric Motor: No drift in either upper extremity Sensory: Intact to light touch  Pertinent Labs: Creatinine 1.8  MRI brain-small area of encephalomalacia in the anterior right temporal lobe  Impression: 89 year old male with new onset seizure in the setting of severe hyperglycemia and severe hypertension.  Though he does have an area of encephalomalacia and therefore may be at a higher risk, I would consider this a provoked seizure and therefore would not commit him to lifelong antiepileptic therapy without other episodes of concern.  If he were to have any other episodes of concern I will have a low threshold for starting antiepileptic therapy.    Recommendations: 1) I discussed with him that he is not allowed to drive for a period of 6 months 2) neurology will be available as needed, please call if further questions or concerns  Aisha Seals, MD [facility]   If 7pm- 7am, please page neurology on call as listed in [facility].

## 2024-11-08 NOTE — Plan of Care (Signed)
" °  Problem: Health Behavior/Discharge Planning: Goal: Ability to manage health-related needs will improve Outcome: Progressing   Problem: Nutritional: Goal: Maintenance of adequate nutrition will improve Outcome: Progressing   Problem: Skin Integrity: Goal: Risk for impaired skin integrity will decrease Outcome: Progressing   Problem: Nutrition: Goal: Adequate nutrition will be maintained Outcome: Progressing   Problem: Coping: Goal: Level of anxiety will decrease Outcome: Progressing   Problem: Elimination: Goal: Will not experience complications related to bowel motility Outcome: Progressing   Problem: Pain Managment: Goal: General experience of comfort will improve and/or be controlled Outcome: Progressing   Problem: Safety: Goal: Ability to remain free from injury will improve Outcome: Progressing   Problem: Skin Integrity: Goal: Risk for impaired skin integrity will decrease Outcome: Progressing   "

## 2024-11-08 NOTE — TOC Progression Note (Signed)
 Transition of Care [facility]) - Progression Note    Patient Details  Name: Dwan Hemmelgarn MRN: [id number] Date of Birth: 10/25/1935  [facility] CM/SW Contact  Almarie CHRISTELLA Goodie, [place] Phone Number: 11/08/2024, 2:53 PM  Clinical Narrative:   CSW spoke with patient's spouse, Sandrea, to discuss SNF offers. Spouse chose [facility]. CSW confirmed [facility] will have a bed for patient, and contacted [employer] to initiate insurance authorization for SNF and PTAR. CSW to follow.    Expected Discharge Plan: Skilled Nursing Facility Barriers to Discharge: Continued Medical Work up, English As A Second Language Teacher               Expected Discharge Plan and Services     Post Acute Care Choice: Skilled Nursing Facility Living arrangements for the past 2 months: Single Family Home                                       Social Drivers of Health (SDOH) Interventions SDOH Screenings   Food Insecurity: No Food Insecurity (11/06/2024)  Housing: Low Risk (11/06/2024)  Transportation Needs: No Transportation Needs (11/06/2024)  Utilities: Patient Declined (11/06/2024)  Social Connections: Socially Integrated (11/06/2024)    Readmission Risk Interventions     No data to display

## 2024-11-09 DIAGNOSIS — R569 Unspecified convulsions: Secondary | ICD-10-CM | POA: Diagnosis not present

## 2024-11-09 MED ADMIN — Insulin Glargine Inj 100 Unit/ML: 5 [IU] | SUBCUTANEOUS | NDC 00088222033

## 2024-11-09 MED ADMIN — Insulin Aspart Inj Soln 100 Unit/ML: 2 [IU] | SUBCUTANEOUS | NDC 73070010011

## 2024-11-09 MED ADMIN — Insulin Aspart Inj Soln 100 Unit/ML: 3 [IU] | SUBCUTANEOUS | NDC 73070010011

## 2024-11-09 MED ADMIN — Heparin Sodium (Porcine) Inj 5000 Unit/ML: 5000 [IU] | SUBCUTANEOUS | NDC 72572025501

## 2024-11-09 MED ADMIN — Amlodipine Besylate Tab 5 MG (Base Equivalent): 5 mg | ORAL | NDC 00904637061

## 2024-11-09 MED ADMIN — Labetalol HCl IV Soln 5 MG/ML: 2.5 mg | INTRAVENOUS | NDC 00409233924

## 2024-11-09 MED ADMIN — Atorvastatin Calcium Tab 40 MG (Base Equivalent): 40 mg | ORAL | NDC 50268009511

## 2024-11-09 MED ADMIN — Aspirin Chew Tab 81 MG: 81 mg | ORAL | NDC 00904679480

## 2024-11-09 MED ADMIN — Camphor & Menthol Lotion 0.5-0.5%: 1 | TOPICAL | NDC 00536126812

## 2024-11-09 MED ADMIN — Venlafaxine HCl Cap ER 24HR 75 MG (Base Equivalent): 150 mg | ORAL | NDC 42291089890

## 2024-11-09 MED ADMIN — Empagliflozin Tab 25 MG: 25 mg | ORAL | NDC 00597015330

## 2024-11-09 MED FILL — Camphor & Menthol Lotion 0.5-0.5%: 1.0000 | CUTANEOUS | Qty: 222 | Status: AC

## 2024-11-09 MED FILL — Venlafaxine HCl Cap ER 24HR 75 MG (Base Equivalent): 150.0000 mg | ORAL | Qty: 2 | Status: AC

## 2024-11-09 MED FILL — Amlodipine Besylate Tab 5 MG (Base Equivalent): 5.0000 mg | ORAL | Qty: 1 | Status: AC

## 2024-11-09 MED FILL — Atorvastatin Calcium Tab 40 MG (Base Equivalent): 40.0000 mg | ORAL | Qty: 1 | Status: AC

## 2024-11-09 MED FILL — Empagliflozin Tab 25 MG: 25.0000 mg | ORAL | Qty: 1 | Status: AC

## 2024-11-09 MED FILL — Aspirin Chew Tab 81 MG: 81.0000 mg | ORAL | Qty: 1 | Status: AC

## 2024-11-09 NOTE — Progress Notes (Signed)
 Report given to Gabriella, CHARITY FUNDRAISER at [facility].

## 2024-11-09 NOTE — Progress Notes (Signed)
 Physical Therapy Treatment Patient Details Name: [name] [name] MRN: [id number] DOB: [birth year] Today's Date: 11/09/2024   History of Present Illness Pt is an 89 y.o. male presenting 12/26 after being found down in [employer] restroom with R gaze, aphasia, R weakness. CXR reported low lung volumes, patchy left basilar airspace opacities, which may reflect atelectasis or pneumonia. MRI brain negative; suspected to be s/p seizure. Found to have R superior and inferior pubic rami fx with extension into anterior column acetabulum. Per ortho notes, TDWB RLE. PMH: DM, HTN, prostate CA s/p resection.    PT Comments  Patient with better orientation, however still did not recall extent of injuries or TDWB status RLE. Better ability to maintain WB status during sit to stand and step-pivot transfer with RW from bed to chair. Continue to feel he will need inpatient therapies <3 hrs/day due to 4 steps to enter home and bedroom on 2nd level.    If plan is discharge home, recommend the following: Two people to help with walking and/or transfers;Assistance with cooking/housework;Direct supervision/assist for medications management;Direct supervision/assist for financial management;Assist for transportation;Help with stairs or ramp for entrance;Supervision due to cognitive status   Can travel by private vehicle     No  Equipment Recommendations  Rolling walker (2 wheels);Wheelchair (measurements PT);Wheelchair cushion (measurements PT)    Recommendations for Other Services       Precautions / Restrictions Precautions Precautions: Fall Recall of Precautions/Restrictions: Impaired Precaution/Restrictions Comments: did not recall fractured pelvis or TDWB Restrictions Weight Bearing Restrictions Per Provider Order: No RLE Weight Bearing Per Provider Order: Touchdown weight bearing     Mobility  Bed Mobility Overal bed mobility: Needs Assistance Bed Mobility: Supine to Sit     Supine to sit: HOB  elevated, Used rails, Contact guard     General bed mobility comments: pt used rail to help pull himself up to sitting EOB    Transfers Overall transfer level: Needs assistance Equipment used: Rolling walker (2 wheels) Transfers: Sit to/from Stand, Bed to chair/wheelchair/BSC Sit to Stand: Min assist   Step pivot transfers: Min assist       General transfer comment: min A toward recliner on L; maintained TDWB 90% of the time with tactile cues; improved ability to hop backwards to align with chair    Ambulation/Gait               General Gait Details: unable to consistently maintain TDWB RLE   Stairs             Wheelchair Mobility     Tilt Bed    Modified Rankin (Stroke Patients Only)       Balance Overall balance assessment: Needs assistance, History of Falls Sitting-balance support: No upper extremity supported, Feet supported Sitting balance-Leahy Scale: Fair     Standing balance support: Bilateral upper extremity supported, During functional activity, Reliant on assistive device for balance Standing balance-Leahy Scale: Poor                              Communication Communication Communication: No apparent difficulties  Cognition Arousal: Alert Behavior During Therapy: WFL for tasks assessed/performed   PT - Cognitive impairments: No family/caregiver present to determine baseline                       PT - Cognition Comments: oriented to self, place, month; not injuries Following commands: Impaired Following commands impaired: Follows one step commands  inconsistently, Follows one step commands with increased time    Cueing Cueing Techniques: Verbal cues, Tactile cues  Exercises General Exercises - Lower Extremity Ankle Circles/Pumps: AROM, Both, 20 reps Heel Slides: AROM, Left, AAROM, Right, 10 reps Hip ABduction/ADduction: AROM, Left, AAROM, Right, 10 reps    General Comments        Pertinent Vitals/Pain  Pain Assessment Pain Assessment: 0-10 Pain Score: 6  Pain Location: rt groin Pain Descriptors / Indicators: Discomfort Pain Intervention(s): Limited activity within patient's tolerance    Home Living                          Prior Function            PT Goals (current goals can now be found in the care plan section) Acute Rehab PT Goals Patient Stated Goal: none stated Time For Goal Achievement: 11/21/24 Potential to Achieve Goals: Good Progress towards PT goals: Progressing toward goals    Frequency    Min 2X/week      PT Plan      Co-evaluation              AM-PAC PT 6 Clicks Mobility   Outcome Measure  Help needed turning from your back to your side while in a flat bed without using bedrails?: A Little Help needed moving from lying on your back to sitting on the side of a flat bed without using bedrails?: A Lot Help needed moving to and from a bed to a chair (including a wheelchair)?: A Little Help needed standing up from a chair using your arms (e.g., wheelchair or bedside chair)?: A Little Help needed to walk in hospital room?: Total Help needed climbing 3-5 steps with a railing? : Total 6 Click Score: 13    End of Session Equipment Utilized During Treatment: Gait belt Activity Tolerance: Patient tolerated treatment well Patient left: in chair;with call bell/phone within reach;with chair alarm set Nurse Communication: Mobility status;Weight bearing status PT Visit Diagnosis: Other abnormalities of gait and mobility (R26.89);History of falling (Z91.81);Pain Pain - Right/Left: Right Pain - part of body: Hip     Time: 9196-9182 PT Time Calculation (min) (ACUTE ONLY): 14 min  Charges:    $Therapeutic Exercise: 8-22 mins PT General Charges $$ ACUTE PT VISIT: 1 Visit                      Macario RAMAN, PT Acute Rehabilitation Services  Office [phone]    Macario SHAUNNA Soja 11/09/2024, 8:25 AM

## 2024-11-09 NOTE — Discharge Summary (Signed)
 "  Physician Discharge Summary  [name] [name] FMW:[id number] DOB: [birth year] DOA: 11/04/2024  PCP: Pcp, No  Admit date: 11/04/2024 Discharge date: 11/09/2024  Admitted from: home Discharge disposition: SNF   Subjective: Patient was seen and examined this morning. Lying down on bed.  Not in distress. Afebrile, heart rate in 80s, blood pressure elevated consistently to 170s and 180s, breathing on room air Blood sugar level in target range  Brief narrative: [name] [name] is a 89 y.o. male with PMH significant for DM2, HTN, prostate cancer s/p resection. Lives at home, ambulates with a walker sometimes. 12/26, patient was found down in a [facility] bathroom with R gaze deviation, aphasia, R facial droop with R sided weakness and neglect.  He had walked to the bathroom independently.  Staff thinks he was in the bathroom approximately 15 minutes.  On EMS arrival he was hypertensive with a systolic blood pressure greater than 220, blood glucose over 500, O2 sat 82% on room air and had right sided neglect.   In the ED patient was afebrile, hemodynamically stable, spontaneously moving extremities but not commands. Labs showed glucose level 600, creatinine 2  CT head unremarkable. CT angio head and neck did not show LVO but showed b/l carotid artery stenosis 85%R and 60%L. While at CT scan, he had seizure-like activity.  Was given Ativan  after which patient became obtunded.. Admitted to [facility] Neurology, vascular and orthopedics were consulted 12/28, MRI brain did not show any acute intracranial normality  12/29, transferred out to [facility] course: Unwitnessed fall Suspected unwitnessed seizure Epilepsia partialis continua Per neurology, overall presentation is most consistent with an unwitnessed seizure resulting in a fall followed by epilepsia partialis continua involving the right face, arm and leg in the setting of severe hyperglycemia. Long-term EEG was unremarkable for  epileptiform activities.  Initially loaded with Keppra .  Not continued for long-term. Seizure precautions advised including not to drive for a period of 6 months at least   Acute metabolic encephalopathy Related to postictal state. Mental status gradually improved back to normal   b/l carotid artery stenosis 85%R and 60%L HLD Vascular surgery consult obtained.   Medical management recommended Started on aspirin 81 mg daily.  Continue Lipitor  Hyperglycemic hyperosmolar state  Type 2 diabetes mellitus uncontrolled A1c 12.1 on 11/05/2024 Blood sugar level was elevated over 500 at presentation.  Serum osmolality was elevated at 335, probably precipitated seizure Diabetes care coordinator consulted.  Patient follows up with Dr. Ilah for diabetes management.  Was on Ozempic in the past.  Not on insulin .  Has been recently on Jardiance 25 mg daily only. Based on blood sugar control in the hospital, discharged on Jardiance 25 mg daily, Lantus  5 units daily and SSI/Accu-Cheks. Recent Labs  Lab 11/08/24 2010 11/08/24 2349 11/09/24 0431 11/09/24 0745 11/09/24 1228  GLUCAP 149* 99 106* 127* 168*   Pelvic fractures Chronic thoracic and lumbar spine compression fractures  12/27, CT chest abdomen pelvis showed acute comminuted intra-articular fracture of the anterior column of the right acetabulum extending into the proximal right superior pubic ramus, and a minimally displaced fracture of the mid portion of the right inferior pubic ramus, with associated small right hip hemarthrosis and mild stranding along the right anterior pelvic sidewall, without pelvic free hemorrhage.  Also showed chronic thoracic and lumbar spine compression fractures. Presumably from the fall prior to presentation as well as recurrent fall in the past Seen by orthopedics.  Recommended nonsurgical management with PRAFO offloading wedge soft boots ,  TDWB RLE with walker and 1-2 person assist at all times  follow up as  outpatient for weightbearing pelvis films  PT/OT consulted Pain regimen --- Scheduled: Tylenol  1 g 3 times daily --- PRN: Oxycodone  as needed  Presumed candidal balanitis and phimosis, scrotal skin infection With phimosis, appreciate urology assistance, as long as patient is voiding, can avoid foley PCCM give 1 dose of fluconazole  150 mg and started on an empiric course of Rocephin  and doxycycline  for possible gonorrhea and chlamydia.  Completed the course Continue nystatin  cream to scrotum  AKI Creatinine gradually improved with fluids. Recent Labs    11/04/24 1825 11/04/24 1827 11/05/24 0315 11/05/24 0505 11/05/24 0953 11/05/24 1444 11/06/24 1129 11/08/24 0133 11/09/24 0910  BUN 29* 33* 24* 23 23 21 19  26* 21  CREATININE 2.00* 1.90* 1.69* 1.71* 1.73* 1.65* 1.67* 1.83* 1.54*  CO2 25  --  24 22 22  21* 20* 25 20*   Multifocal pneumonia 12/27, CT chest abdomen pelvis showed asymmetric posterior basal right lower lobe opacity likely due to bronchopneumonia, with additional suspected scattered ill-defined ground glass opacities in both lower lobes consistent with pneumonia. Completed course of Rocephin  and doxycycline .    Hypertension blood pressure has been consistently elevated to 170s and 180s, PTA meds-I do not see any antihypertensives in his home med list Started on amlodipine 5 mg daily today  Right renal mass 12/27, CT chest abdomen pelvis showed slightly heterogeneous rounded right parapelvic solid renal mass measuring 3.5 x 3.0 cm and 43 Hounsfield units, worrisome for neoplasm, with further evaluation recommended with renal protocol MRI or CT without and with contrast.  It also showed asymmetric sclerosis involving the right C7 vertebral body extending into the pedicle and articular pillar, indeterminate with metastatic disease not excluded, although Paget disease could have a similar appearance, with further evaluation recommended. Background Severe right renal atrophy  with apparent renal artery embolization. On my discussion with patient on 12/29, he mentioned that he has a spot on his right kidney which was cancerous and underwent 'a procedure' which he is not able to elaborate.  I assume he has had renal artery embolization.  He follows up with urology as an outpatient  Bladder trabeculation and diverticula which could be due to chronic bladder outlet obstruction or chronic infection.  Anxiety/depression Resume Effexor  Impaired mobility PT eval obtained.  SNF recommended   Goals of care   Code Status: Full Code   Diet:  Diet Order             Diet general           DIET DYS 2 Room service appropriate? No; Fluid consistency: Thin  Diet effective now                   Nutritional status:  Body mass index is 25.41 kg/m.       Wounds:  - Wound 11/05/24 0640 Irritant Contact Dermatitis Penis (Active)  Date First Assessed/Time First Assessed: 11/05/24 0640   Present on Original Admission: Yes  Primary Wound Type: Irritant Contact Dermatitis  Secondary Wound Type - Irritant Contact Dermatitis: Incontinence  Location: Penis    Assessments 11/05/2024  6:23 AM 11/09/2024  7:54 AM  Wound Image     Site / Wound Assessment Red --  Peri-wound Assessment Intact;Erythema (blanchable) Intact;Erythema (blanchable)  Drainage Description Serous --  Drainage Amount Scant None  Treatments Cleansed Cleansed  Dressing Type None None     No associated orders.  Wound 11/05/24 2000 Other (Comment) Heel Right (Active)  Date First Assessed/Time First Assessed: 11/05/24 2000   Present on Original Admission: Yes  Primary Wound Type: (c) Other (Comment)  Location: Heel  Location Orientation: Right    Assessments 11/05/2024  8:00 PM 11/09/2024  7:54 AM  Site / Wound Assessment Clean;Dry;Purple;Red Clean;Dry;Purple  Peri-wound Assessment Intact Intact  Drainage Amount -- None  Dressing Type None None     No associated orders.    Discharge  Medications:   Allergies as of 11/09/2024   No Known Allergies      Medication List     TAKE these medications    acetaminophen  500 MG tablet Commonly known as: TYLENOL  Take 2 tablets (1,000 mg total) by mouth 3 (three) times daily.   amLODipine 5 MG tablet Commonly known as: NORVASC Take 1 tablet (5 mg total) by mouth daily. Start taking on: November 10, 2024   aspirin 81 MG chewable tablet Chew 1 tablet (81 mg total) by mouth daily.   atorvastatin 40 MG tablet Commonly known as: LIPITOR Take 40 mg by mouth daily.   docusate sodium  100 MG capsule Commonly known as: COLACE Take 1 capsule (100 mg total) by mouth 2 (two) times daily as needed for mild constipation.   ferrous sulfate 325 (65 FE) MG tablet Take 325 mg by mouth every other day.   insulin  aspart 100 UNIT/ML injection Commonly known as: novoLOG  Inject 0-15 Units into the skin every 4 (four) hours.   insulin  glargine 100 UNIT/ML injection Commonly known as: LANTUS  Inject 0.05 mLs (5 Units total) into the skin at bedtime.   Jardiance 25 MG Tabs tablet Generic drug: empagliflozin Take 25 mg by mouth daily.   nystatin  powder Commonly known as: MYCOSTATIN /NYSTOP  Apply topically 3 (three) times daily.   oxyCODONE  5 MG immediate release tablet Commonly known as: Oxy IR/ROXICODONE  Take 1 tablet (5 mg total) by mouth every 6 (six) hours as needed for up to 3 days for moderate pain (pain score 4-6).   polyethylene glycol 17 g packet Commonly known as: MIRALAX  / GLYCOLAX  Take 17 g by mouth daily as needed for moderate constipation.   venlafaxine XR 150 MG 24 hr capsule Commonly known as: EFFEXOR-XR Take 150 mg by mouth daily.               Discharge Care Instructions  (From admission, onward)           Start     Ordered   11/09/24 0000  Discharge wound care:        11/09/24 1046             Follow ups:    Contact information for follow-up providers     [facility] Follow up.   Contact information: [address] [employer] [address] [place]  [address] [phone]             Contact information for after-discharge care     Destination     [facility] .   Service: Skilled Nursing Contact information: [address] [place]  [address] [phone]                     Discharge Instructions:   Discharge Instructions     Call MD for:  difficulty breathing, headache or visual disturbances   Complete by: As directed    Call MD for:  extreme fatigue   Complete by: As directed  Call MD for:  hives   Complete by: As directed    Call MD for:  persistant dizziness or light-headedness   Complete by: As directed    Call MD for:  persistant nausea and vomiting   Complete by: As directed    Call MD for:  severe uncontrolled pain   Complete by: As directed    Call MD for:  temperature >100.4   Complete by: As directed    Diet general   Complete by: As directed    Discharge instructions   Complete by: As directed    Discharge instructions for diabetes mellitus: Check blood sugar 3 times a day and bedtime at home. If blood sugar running above 200 or less than 70 please call your MD to adjust insulin . If you notice signs and symptoms of hypoglycemia (low blood sugar) like jitteriness, confusion, thirst, tremor and sweating, please check blood sugar, drink sugary drink/biscuits/sweets to increase sugar level and call MD or return to ER.      PDMP reviewed this encounter.   Opioid taper instructions: It is important to wean off of your opioid medication as soon as possible. If you do not need pain medication after your surgery it is ok to stop day one. Opioids include: Codeine, Hydrocodone(Norco, Vicodin), Oxycodone (Percocet, oxycontin ) and hydromorphone amongst others.  Long term and even short term use of opiods can cause: Increased pain  response Dependence Constipation Depression Respiratory depression And more.  Withdrawal symptoms can include Flu like symptoms Nausea, vomiting And more Techniques to manage these symptoms Hydrate well Eat regular healthy meals Stay active Use relaxation techniques(deep breathing, meditating, yoga) Do Not substitute Alcohol to help with tapering If you have been on opioids for less than two weeks and do not have pain than it is ok to stop all together.  Plan to wean off of opioids This plan should start within one week post op of your joint replacement. Maintain the same interval or time between taking each dose and first decrease the dose.  Cut the total daily intake of opioids by one tablet each day Next start to increase the time between doses. The last dose that should be eliminated is the evening dose.        General discharge instructions: Follow with Primary MD Pcp, No in 7 days  Please request your PCP  to go over your hospital tests, procedures, radiology results at the follow up. Please get your medicines reviewed and adjusted.  Your PCP may decide to repeat certain labs or tests as needed. Do not drive, operate heavy machinery, perform activities at heights, swimming or participation in water activities or provide baby sitting services if your were admitted for syncope or siezures until you have seen by Primary MD or a Neurologist and advised to do so again.   Controlled Substance Reporting System database was reviewed. Do not drive, operate heavy machinery, perform activities at heights, swim, participate in water activities or provide baby-sitting services while on medications for pain, sleep and mood until your outpatient physician has reevaluated you and advised to do so again.  You are strongly recommended to comply with the dose, frequency and duration of prescribed medications. Activity: As tolerated with Full fall precautions use walker/cane &  assistance as needed Avoid using any recreational substances like cigarette, tobacco, alcohol, or non-prescribed drug. If you experience worsening of your admission symptoms, develop shortness of breath, life threatening emergency, suicidal or homicidal thoughts you must seek medical attention immediately by calling  911 or calling your MD immediately  if symptoms less severe. You must read complete instructions/literature along with all the possible adverse reactions/side effects for all the medicines you take and that have been prescribed to you. Take any new medicine only after you have completely understood and accepted all the possible adverse reactions/side effects.  Wear Seat belts while driving. You were cared for by a hospitalist during your hospital stay. If you have any questions about your discharge medications or the care you received while you were in the hospital after you are discharged, you can call the unit and ask to speak with the hospitalist or the covering physician. Once you are discharged, your primary care physician will handle any further medical issues. Please note that NO REFILLS for any discharge medications will be authorized once you are discharged, as it is imperative that you return to your primary care physician (or establish a relationship with a primary care physician if you do not have one).   Discharge wound care:   Complete by: As directed    Increase activity slowly   Complete by: As directed        Discharge Exam:   Vitals:   11/09/24 0500 11/09/24 0748 11/09/24 0902 11/09/24 1151  BP:  (!) 192/83 (!) 176/86 (!) 164/85  Pulse:  83  87  Resp:  18  19  Temp:  98.4 F (36.9 C)    TempSrc:  Oral    SpO2:  96%  95%  Weight: 75.8 kg     Height:   5' 8 (1.727 m)     Body mass index is 25.41 kg/m.  General exam: Pleasant, elderly Caucasian male.  Not in physical distress Skin: No rashes, lesions or ulcers. HEENT: Atraumatic, normocephalic, no obvious  bleeding Lungs: Clear to auscultation bilaterally,  CVS: S1, S2, no murmur,   GI/Abd: Soft, nontender, nondistended, bowel sound present,   CNS: Alert, awake, slow to respond but oriented x 3 Psychiatry: Mood appropriate Extremities: No pedal edema, no calf tenderness,    The results of significant diagnostics from this hospitalization (including imaging, microbiology, ancillary and laboratory) are listed below for reference.    Procedures and Diagnostic Studies:   Overnight EEG with video Result Date: 11/05/2024 Shelton Arlin KIDD, MD     11/05/2024  8:29 PM Patient Name: [name] [name] MRN: [id number] Epilepsy Attending: Arlin KIDD Shelton Referring Physician/Provider: Remi Pippin, NP Duration: 11/04/2024 2045 to 11/05/2024 9075 Patient history: unknown age and name male with no known PMHx except for diabetes (unidentified at time of presentation). He was found down at [facility] in the bathroom. Staff think he was in there for 15 minutes. Found with right gaze, aphasia, right facial froop, right sided weakness, and right neglect. EEG to evaluate for seizure Level of alertness: Awake, asleep AEDs during EEG study: LEV Technical aspects: This EEG study was done with scalp electrodes positioned according to the 10-20 International system of electrode placement. Electrical activity was reviewed with band pass filter of 1-70Hz , sensitivity of 7 uV/mm, display speed of 43mm/sec with a 60Hz  notched filter applied as appropriate. EEG data were recorded continuously and digitally stored.  Video monitoring was available and reviewed as appropriate. Description: The posterior dominant rhythm consists of 8-9Hz  activity of moderate voltage (25-35 uV) seen predominantly in posterior head regions, symmetric and reactive to eye opening and eye closing. Sleep was characterized by vertex waves, sleep spindles (12 to 14 Hz), maximal frontocentral region.  There is continuous 3 to 6  Hz theta-delta slowing in left  fronto-temporal region. Hyperventilation and photic stimulation were not performed.   ABNORMALITY - Continuous slow, left fronto-temporal region IMPRESSION: This study is suggestive of cortical dysfunction arising from  left fronto-temporal region likely secondary to underlying structural abnormality, post-ictal state. No seizures or epileptiform discharges were seen throughout the recording. Priyanka MALVA Krebs   CT CHEST ABDOMEN PELVIS WO CONTRAST Result Date: 11/05/2024 EXAM: CT CHEST, ABDOMEN AND PELVIS WITHOUT CONTRAST 11/05/2024 06:04:30 AM TECHNIQUE: CT of the chest, abdomen and pelvis was performed without the administration of intravenous contrast. Multiplanar reformatted images are provided for review. Automated exposure control, iterative reconstruction, and/or weight based adjustment of the mA/kV was utilized to reduce the radiation dose to as low as reasonably achievable. COMPARISON: CTA head and neck dated 11/04/2024. CLINICAL HISTORY: Polytrauma, blunt; found down, AMS, AP. Exam is for trauma and sepsis screening. He had vascular stenoses on CTA head and neck yesterday, but the exam was negative for acute ischemia in the brain. FINDINGS: LIMITATIONS/ARTIFACTS: There is abundant respiratory motion on exam as well as body motion, limiting the study. CHEST: MEDIASTINUM AND LYMPH NODES: Heart and pericardium are unremarkable. The cardiac size is normal. There are patchy 3-vessel coronary calcifications. There is atherosclerosis in the aorta and great vessels but no aortic aneurysm. The pulmonary arteries and veins are normal in caliber. The left lobe of the thyroid gland has either been removed or is atrophic. The right lobe is unremarkable. There is a mildly patulous esophagus with retained versus refluxed fluid in the upper thoracic esophagus. Consider aspiration precautions unless already being done. No mediastinal, hilar or axillary lymphadenopathy. LUNGS AND PLEURA: Diffuse bronchial thickening is  seen. Central airways appear to be grossly clear, but there are scattered posterior basal subsegmental bronchial impactions visible in the right lower lobe if not also a few in the left lower lobe. There is asymmetric posterior basal right lower lobe opacity, which is probably due to bronchopneumonia given the bronchial impactions in the area. Although fine detail is very limited, there are also suspected to be scattered ill-defined ground glass opacities in both lower lobes above this level consistent with additional pneumonia. No other focal consolidations are seen. There is asymmetric pleuroparenchymal scarring in the right apex. Trace pleural effusions. No pneumothorax. ABDOMEN AND PELVIS: LIVER: There is loss of fine detail in the liver parenchyma with no obvious mass. GALLBLADDER AND BILE DUCTS: There is vicariously excreted contrast in the gallbladder. No filling defects, wall thickening, or biliary dilatation. SPLEEN: No acute abnormality. PANCREAS: No acute abnormality. ADRENAL GLANDS: There is slight nodular thickening of both adrenal glands. KIDNEYS, URETERS AND BLADDER: Right kidney is severely atrophic. The right renal artery and multiple of its hilar branches appear to have been embolized. There is branching opaque material extending through the right renal artery to the hilar branches with severe chronic right renal atrophy. There is a slightly heterogeneous rounded right parapelvic solid mass measuring 3.5 x 3 cm and 43 Hounsfield units on series 3 axial 62 worrisome for neoplasm. Consider follow-up with MRI versus CT without contrast. MRI may be compromised by susceptibility artifact. There is scarring and volume loss in the inferior pole of the left kidney but no contour deforming mass is suspected . There is contrast in the left renal collecting system and in the normal caliber ureter. The contrast would obscure stones if present. No obstructive uropathy is seen. . There is thickening and  trabeculation of the bladder wall. Bilateral diverticula arise from the  posterolateral bladder wall from a narrow opening on both sides. Etiology could be chronic infection or chronic bladder outlet obstruction. GI AND BOWEL: Stomach demonstrates no acute abnormality. No bowel obstruction or inflammation is seen through the motion artifacts. REPRODUCTIVE ORGANS: There has been a prior radical prostatectomy. No masses seen in the prostate bed. PERITONEUM AND RETROPERITONEUM: No ascites. No free air. VASCULATURE: Aorta is normal in caliber with heavy aortoiliac calcific plaques . . . ABDOMINAL AND PELVIS LYMPH NODES: No lymphadenopathy. BONES AND SOFT TISSUES: There is bilateral subareolar gynecomastia. No acute chest wall findings. No obvious thoracic skeletal acute fracture. There is asymmetric sclerosis in the right side of the C7 vertebral body extending into the pedicle and articular pillar concerning for metastatic disease, although no other levels show focal lesions and mono-ostotic Paget's disease could also have this appearance. There is mild chronic upper plate anterior wedging of the T7 vertebral body. The T12 ribs are hypoplastic. SABRA There is a chronic moderate anterior wedge compression fracture of the T12 body with bridging osteophytes to T11 and slight retropulsion. There is a recent, probably acute comminuted intraarticular fracture of the anterior column of the right acetabulum extending into the proximal right superior pubic ramus, and a minimally displaced fracture of the mid portion of the right inferior pubic ramus. There is at least a small right hip hemarthrosis associated. There is mild stranding along the right anterior pelvic sidewall adjacent to the acetabular fracture but no pelvic free hemorrhage. There is no other definitive acute regional skeletal fracture. Prominent There are only 4 lumbar-type segments in this patient. There are mild to moderate compression fractures at L2 and L4 and a  mild upper plate anterior wedge compression fracture of L3, which all have a grossly chronic appearance. There are degenerative changes and osteopenia. Spurring of the symphysis pubis. No suspicious regional bone lesion. There are small inguinal fat hernias. IMPRESSION: 1. Acute comminuted intraarticular fracture of the anterior column of the right acetabulum extending into the proximal right superior pubic ramus, and a minimally displaced fracture of the mid portion of the right inferior pubic ramus, with associated small right hip hemarthrosis and mild stranding along the right anterior pelvic sidewall, without pelvic free hemorrhage. 2. Asymmetric posterior basal right lower lobe opacity likely due to bronchopneumonia, with additional suspected scattered ill-defined ground glass opacities in both lower lobes consistent with pneumonia. 3. Slightly heterogeneous rounded right parapelvic solid renal mass measuring 3.5 x 3.0 cm and 43 Hounsfield units, worrisome for neoplasm, with further evaluation recommended with renal protocol MRI or CT without and with contrast. Background Severe right renal atrophy with apparent renal artery embolization. 4. Asymmetric sclerosis involving the right C7 vertebral body extending into the pedicle and articular pillar, indeterminate with metastatic disease not excluded, although Paget disease could have a similar appearance, with further evaluation recommended. . 5. Aortic and coronary atherosclerosis. SABRA 6. Thoracic and lumbar spine compression fractures with a chronic appearance . 7. Bladder trabeculation and diverticula , which could be due to chronic bladder outlet obstruction or chronic infection. Electronically signed by: Francis Quam MD 11/05/2024 07:07 AM EST RP Workstation: [id number]   DG Chest 1 View Result Date: 11/04/2024 EXAM: 1 VIEW(S) XRAY OF THE CHEST 11/04/2024 07:06:00 PM COMPARISON: None available. CLINICAL HISTORY: cough/found down FINDINGS: LUNGS AND  PLEURA: Low lung volumes. Patchy airspace opacities at left lung base. Question right apical nodule-like density versus overlapping osseous structures. No pleural effusion. No pneumothorax. HEART AND MEDIASTINUM: Aortic atherosclerosis. No acute abnormality  of the cardiac and mediastinal silhouettes. BONES AND SOFT TISSUES: No acute osseous abnormality. IMPRESSION: 1. Low lung volumes with patchy left basilar airspace opacities, which may reflect atelectasis or pneumonia. Recommend repeat PA and lateral view of the chest with improved inspiratory effort to further evaluate. 2. Question right apical nodule-like density versus overlapping osseous structures. Finding can also be further evaluated on repeat chest x-ray. Electronically signed by: Morgane Naveau MD 11/04/2024 08:57 PM EST RP Workstation: [id number]   CT ANGIO HEAD NECK W WO CM W PERF (CODE STROKE) Result Date: 11/04/2024 EXAM: CT ANGIOGRAPHY OF THE HEAD AND NECK CT PERFUSION BRAIN 11/04/2024 06:45:49 PM TECHNIQUE: Contiguous axial images were obtained from the base of the skull through the vertex without intravenous contrast. Multidetector CT imaging of the head and neck was performed using the standard protocol during bolus administration of 100 mL of iohexol  (OMNIPAQUE ) 350 MG/ML injection. 3D postprocessing with multiplanar reconstructions and MIPs was performed to evaluate the vascular anatomy. Carotid stenosis measurements (when applicable) are obtained utilizing NASCET criteria, using the distal internal carotid diameter as the denominator. Cerebral perfusion analysis using computed tomography with contrast administration, including post-processing of parametric maps with determination of cerebral blood flow, cerebral blood volume, mean transit time and time-to-maximum. RADIATION DOSE REDUCTION: This exam was performed according to the departmental dose-optimization program which includes automated exposure control, adjustment of the mA and/or  kV according to patient size and/or use of iterative reconstruction technique. CONTRAST: Without and with IV contrast. 100 mL of iohexol  (OMNIPAQUE ) 350 MG/ML injection. COMPARISON: CT head dated 11/04/2024. CLINICAL HISTORY: Neuro deficit, acute, stroke suspected. FINDINGS: CT HEAD: Question prominence of the bilateral optic nerve sheath complexes; recommend correlation with fundoscopic exam findings. CTA NECK: AORTIC ARCH AND ARCH VESSELS: Moderate atherosclerosis of the visualized aortic arch. 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 Atherosclerosis at the origin of the aberrant right subclavian resulting in focal mild stenosis. Moderate stenosis at the origin of the left subclavian artery. No dissection or arterial injury. CERVICAL CAROTID ARTERIES: Mixed atherosclerotic plaque at the right carotid bifurcation resulting in 85% stenosis of the proximal right cervical ICA. Additional moderate stenosis at the origin of the right external carotid artery. Mixed atherosclerotic plaque at the left carotid bifurcation which results in approximately 60% stenosis at the origin of the left cervical ICA. No dissection or arterial injury. CERVICAL VERTEBRAL ARTERIES: Right vertebral artery origin is obscured due to streak artifact from dense venous contrast. The right vertebral artery is dominant. Atherosclerosis of the origin of the nondominant left vertebral artery resulting in focal severe stenosis. No dissection or arterial injury. LUNGS AND MEDIASTINUM: Opacities within the medial aspect of the right upper lobe. There is bronchial wall thickening within the right upper lobe which may be related to infection. SOFT TISSUES: Hypoplastic appearance of the thyroid with nonvisualization of the left thyroid lobe. BONES: Degenerative changes in the visualized spine. There is 5 mm anterolisthesis of C3 on C4 likely related to chronic facet degenerative changes. Dental caries. Sclerosis in the right aspect  of the C7 vertebral body and the right pedicle of C7 which could reflect an osseous lesion; recommend correlation with MRI of the cervical spine. CTA HEAD: ANTERIOR CIRCULATION: Atherosclerosis of the bilateral carotid siphons. There is moderate stenosis of the bilateral cavernous and paraophthalmic ICAs. There is a 2 mm inferiorly directed outpouching along the left supraclinoid ICA which may reflect a small aneurysm versus infundibulum at the origin of the posterior communicating artery. A1 segment  of the right ACA likely congenital. No significant stenosis of the anterior cerebral arteries. No significant stenosis of the middle cerebral arteries. POSTERIOR CIRCULATION: There is a 2 mm inferiorly directed outpouching at the P1 and P2 junction of the left PCA concerning for small aneurysm (series 5, image 134). No significant stenosis of the posterior cerebral arteries. No significant stenosis of the basilar artery. No significant stenosis of the vertebral arteries. OTHER: No dural venous sinus thrombosis on this non-dedicated study. CT PERFUSION: EXAM QUALITY: Exam quality is adequate with diagnostic perfusion maps. No significant motion artifact. Appropriate arterial inflow and venous outflow curves. CORE INFARCT (CBF<30% volume): 0 mL TOTAL HYPOPERFUSION (Tmax>6s volume): 3 mL PENUMBRA: Mismatch volume: 3 mL Mismatch ratio: not applicable Location: not applicable IMPRESSION: 1. No large vessel occlusion. 2. No evidence of ischemia by CT brain perfusion. 3. Mixed atherosclerotic plaque at the right carotid bifurcation with approximately 85% stenosis at the origin of the right cervical ICA. 4. Mixed atherosclerotic plaque at the left carotid bifurcation with approximately 60% stenosis at the origin of the left cervical ICA. 5. Severe stenosis at the origin of the nondominant left vertebral artery. 6. Atherosclerosis of the bilateral carotid siphons with moderate stenosis of the bilateral cavernous and  paraophthalmic ICAs. 7. 2 mm inferiorly directed outpouching along the left supraclinoid ICA, possibly a small aneurysm versus infundibulum at the origin of the posterior communicating artery. 8. Additional 2 mm inferiorly directed outpouching at the P1 and P2 junction of the left PCA, concerning for a small aneurysm. 9. Question prominence of the bilateral optic nerve sheath complexes. Recommend correlation with fundoscopic exam findings. 10. Sclerosis in the right aspect of the C7 vertebral body and right pedicle, which could reflect an osseous lesion. Recommend MRI of the cervical spine with and without contrast. 11. Opacities and bronchial wall thickening in the medial right upper lobe, which may be related to infection. Electronically signed by: Donnice Mania MD 11/04/2024 07:21 PM EST RP Workstation: [id number]   CT HEAD CODE STROKE WO CONTRAST Result Date: 11/04/2024 EXAM: CT HEAD WITHOUT CONTRAST 11/04/2024 06:32:10 PM TECHNIQUE: CT of the head was performed without the administration of intravenous contrast. Automated exposure control, iterative reconstruction, and/or weight based adjustment of the mA/kV was utilized to reduce the radiation dose to as low as reasonably achievable. COMPARISON: None available. CLINICAL HISTORY: Neuro deficit, acute, stroke suspected. FINDINGS: BRAIN AND VENTRICLES: No acute hemorrhage. No evidence of acute infarct. Mild to moderate chronic microvascular ischemic changes and generalized parenchymal volume loss. Skull base atherosclerosis involving the carotid siphons and intracranial vertebral arteries. No hydrocephalus. No extra-axial collection. No mass effect or midline shift. [place] Stroke Program Early CT (ASPECT) Score: Ganglionic (caudate, internal capsule, lentiform nucleus, insula, M1-M3): 7 Supraganglionic (M4-M6): 3 Total: 10 ORBITS: Bilateral lens replacement. SINUSES: No acute abnormality. SOFT TISSUES AND SKULL: No acute soft tissue abnormality. No skull  fracture. Chronic bilateral nasal bone deformities. IMPRESSION: 1. No acute intracranial abnormality. 2. ASPECTS 10. 3. Findings messaged to Dr. Lindzen at 6:39 PM on 11/04/24. Electronically signed by: Donnice Mania MD 11/04/2024 06:41 PM EST RP Workstation: [id number]     Labs:   Basic Metabolic Panel: Recent Labs  Lab 11/05/24 0505 11/05/24 0953 11/05/24 1444 11/06/24 1129 11/08/24 0133 11/09/24 0910  NA 138 140 141 137 136 134*  K 3.7 3.3* 3.8 4.8 4.4 4.4  CL 104 107 107 105 102 102  CO2 22 22 21* 20* 25 20*  GLUCOSE 180* 193* 123* 143* 111* 108*  BUN 23 23 21 19  26* 21  CREATININE 1.71* 1.73* 1.65* 1.67* 1.83* 1.54*  CALCIUM 9.0 8.7* 9.2 8.8* 8.8* 9.2  MG 1.8  --   --   --   --   --   PHOS 2.0*  --   --   --   --   --    GFR Estimated Creatinine Clearance: 32.7 mL/min (A) (by C-G formula based on SCr of 1.54 mg/dL (H)). Liver Function Tests: Recent Labs  Lab 11/04/24 1825  AST 51*  ALT 39  ALKPHOS 170*  BILITOT 0.5  PROT 7.0  ALBUMIN 4.3   No results for input(s): LIPASE, AMYLASE in the last 168 hours. Recent Labs  Lab 11/05/24 0511  AMMONIA 25   Coagulation profile Recent Labs  Lab 11/04/24 1825  INR 1.0    CBC: Recent Labs  Lab 11/04/24 1825 11/04/24 1827 11/05/24 0106 11/05/24 0315 11/05/24 0505 11/08/24 0133  WBC 12.1*  --   --  17.3* 18.4* 8.0  NEUTROABS 7.0  --   --   --   --  5.8  HGB 15.2 16.0 12.9* 13.3 13.1 11.8*  HCT 44.9 47.0 38.0* 39.9 38.4* 35.4*  MCV 91.4  --   --  93.0 92.3 92.4  PLT 271  --   --  200 208 189   Cardiac Enzymes: No results for input(s): CKTOTAL, CKMB, CKMBINDEX, TROPONINI in the last 168 hours. BNP: Invalid input(s): POCBNP CBG: Recent Labs  Lab 11/08/24 2010 11/08/24 2349 11/09/24 0431 11/09/24 0745 11/09/24 1228  GLUCAP 149* 99 106* 127* 168*   D-Dimer No results for input(s): DDIMER in the last 72 hours. Hgb A1c No results for input(s): HGBA1C in the last 72 hours. Lipid  Profile No results for input(s): CHOL, HDL, LDLCALC, TRIG, CHOLHDL, LDLDIRECT in the last 72 hours. Thyroid function studies No results for input(s): TSH, T4TOTAL, T3FREE, THYROIDAB in the last 72 hours.  Invalid input(s): FREET3 Anemia work up No results for input(s): VITAMINB12, FOLATE, FERRITIN, TIBC, IRON, RETICCTPCT in the last 72 hours. Microbiology Recent Results (from the past 240 hours)  Resp panel by RT-PCR (RSV, Flu A&B, Covid) Anterior Nasal Swab     Status: None   Collection Time: 11/04/24  7:02 PM   Specimen: Anterior Nasal Swab  Result Value Ref Range Status   SARS Coronavirus 2 by RT PCR NEGATIVE NEGATIVE Final   Influenza A by PCR NEGATIVE NEGATIVE Final   Influenza B by PCR NEGATIVE NEGATIVE Final    Comment: (NOTE) The Xpert Xpress SARS-CoV-2/FLU/RSV plus assay is intended as an aid in the diagnosis of influenza from Nasopharyngeal swab specimens and should not be used as a sole basis for treatment. Nasal washings and aspirates are unacceptable for Xpert Xpress SARS-CoV-2/FLU/RSV testing.  Fact Sheet for Patients: bloggercourse.com  Fact Sheet for Healthcare Providers: seriousbroker.it  This test is not yet approved or cleared by the [place]  FDA and has been authorized for detection and/or diagnosis of SARS-CoV-2 by FDA under an Emergency Use Authorization (EUA). This EUA will remain in effect (meaning this test can be used) for the duration of the COVID-19 declaration under Section 564(b)(1) of the Act, 21 U.S.C. section 360bbb-3(b)(1), unless the authorization is terminated or revoked.     Resp Syncytial Virus by PCR NEGATIVE NEGATIVE Final    Comment: (NOTE) Fact Sheet for Patients: bloggercourse.com  Fact Sheet for Healthcare Providers: seriousbroker.it  This test is not yet approved or cleared by the [place]  [place] FDA and  has been authorized for detection and/or diagnosis of SARS-CoV-2 by FDA under an Emergency Use Authorization (EUA). This EUA will remain in effect (meaning this test can be used) for the duration of the COVID-19 declaration under Section 564(b)(1) of the Act, 21 U.S.C. section 360bbb-3(b)(1), unless the authorization is terminated or revoked.  Performed at [facility], [address] [place], [place] [address]   Culture, blood (routine x 2)     Status: None   Collection Time: 11/04/24  8:24 PM   Specimen: BLOOD  Result Value Ref Range Status   Specimen Description BLOOD LEFT ANTECUBITAL  Final   Special Requests   Final    BOTTLES DRAWN AEROBIC AND ANAEROBIC Blood Culture adequate volume   Culture   Final    NO GROWTH 5 DAYS Performed at [facility], [address] [place], [place] [address]    Report Status 11/09/2024 FINAL  Final  MRSA Next Gen by PCR, Nasal     Status: None   Collection Time: 11/05/24 12:21 AM   Specimen: Nasal Mucosa; Nasal Swab  Result Value Ref Range Status   MRSA by PCR Next Gen NOT DETECTED NOT DETECTED Final    Comment: (NOTE) The GeneXpert MRSA Assay (FDA approved for NASAL specimens only), is one component of a comprehensive MRSA colonization surveillance program. It is not intended to diagnose MRSA infection nor to guide or monitor treatment for MRSA infections. Test performance is not FDA approved in patients less than 64 years old. Performed at [facility], [address], [place], [place] [address]   Culture, blood (routine x 2)     Status: None (Preliminary result)   Collection Time: 11/05/24  6:59 AM   Specimen: BLOOD LEFT HAND  Result Value Ref Range Status   Specimen Description BLOOD LEFT HAND  Final   Special Requests   Final    BOTTLES DRAWN AEROBIC ONLY Blood Culture results may not be optimal due to an inadequate volume of blood received in culture bottles   Culture   Final    NO GROWTH 4  DAYS Performed at [facility], [address], [place], [place] [address]    Report Status PENDING  Incomplete    Time coordinating discharge: 45 minutes  Signed: Chesnee Floren  [facility] 11/09/2024, 12:43 PM   "

## 2024-11-09 NOTE — TOC Transition Note (Signed)
 Transition of Care [facility]) - Discharge Note   Patient Details  Name: [name] [name] MRN: [id number] Date of Birth: [birth year]  Transition of Care [facility]) CM/SW Contact:  Almarie CHRISTELLA Goodie, LCSW Phone Number: 11/09/2024, 12:37 PM   Clinical Narrative:   CSW received insurance approval for patient to admit to SNF [facility] [phone]) as well as [facility] transport [facility] [phone]). Patient medically stable for discharge, and discharge information sent to [facility]. Confirmed that [facility] has a bed available for patient today. CSW spoke with spouse, Roberta, to provide update and answer questions. Roberta in agreement. Transport arranged with [facility] for after 3 pm, per SNF request.  Nurse to call report to [phone] Room 9A.    Final next level of care: Skilled Nursing Facility Barriers to Discharge: Barriers Resolved   Patient Goals and CMS Choice Patient states their goals for this hospitalization and ongoing recovery are:: patient unable to participate in goal setting, not fully oriented CMS Medicare.gov Compare Post Acute Care list provided to:: Patient Represenative (must comment) Choice offered to / list presented to : Spouse [place] ownership interest in [facility].provided to:: Spouse    Discharge Placement              Patient chooses bed at: [facility] Patient to be transferred to facility by: [facility] Name of family member notified: Roberta Patient and family notified of of transfer: 11/09/24  Discharge Plan and Services Additional resources added to the After Visit Summary for       Post Acute Care Choice: Skilled Nursing Facility                               Social Drivers of Health (SDOH) Interventions SDOH Screenings   Food Insecurity: No Food Insecurity (11/06/2024)  Housing: Low Risk (11/06/2024)  Transportation Needs: No Transportation Needs (11/06/2024)  Utilities: Patient Declined (11/06/2024)  Social  Connections: Socially Integrated (11/06/2024)  Tobacco Use: Medium Risk (11/08/2024)     Readmission Risk Interventions     No data to display

## 2024-11-09 NOTE — Progress Notes (Signed)
 AVS placed in discharge packet for receiving facility. Reena Harari called report to receiving nurse. [facility] here to transport patient to [facility]. PIV removed. Belongings sent with patient.

## 2024-11-09 NOTE — Progress Notes (Signed)
 "  Physician Discharge Summary  [name] [name] FMW:[id number] DOB: 11-Jan-1935 DOA: 11/04/2024  PCP: Pcp, No  Admit date: 11/04/2024 Discharge date: 11/09/2024  Admitted from: home Discharge disposition: SNF   Subjective: Patient was seen and examined this morning. Lying down on bed.  Not in distress. Afebrile, heart rate in 80s, blood pressure elevated consistently to 170s and 180s, breathing on room air Blood sugar level in target range  Brief narrative: [name] [name] is a 89 y.o. male with PMH significant for DM2, HTN, prostate cancer s/p resection. Lives at home, ambulates with a walker sometimes. 12/26, patient was found down in a [facility] bathroom with R gaze deviation, aphasia, R facial droop with R sided weakness and neglect.  He had walked to the bathroom independently.  Staff thinks he was in the bathroom approximately 15 minutes.  On EMS arrival he was hypertensive with a systolic blood pressure greater than 220, blood glucose over 500, O2 sat 82% on room air and had right sided neglect.   In the ED patient was afebrile, hemodynamically stable, spontaneously moving extremities but not commands. Labs showed glucose level 600, creatinine 2  CT head unremarkable. CT angio head and neck did not show LVO but showed b/l carotid artery stenosis 85%R and 60%L. While at CT scan, he had seizure-like activity.  Was given Ativan  after which patient became obtunded.. Admitted to [facility] Neurology, vascular and orthopedics were consulted 12/28, MRI brain did not show any acute intracranial normality  12/29, transferred out to [facility] course: Unwitnessed fall Suspected unwitnessed seizure Epilepsia partialis continua Per neurology, overall presentation is most consistent with an unwitnessed seizure resulting in a fall followed by epilepsia partialis continua involving the right face, arm and leg in the setting of severe hyperglycemia. Long-term EEG was unremarkable for  epileptiform activities.  Initially loaded with Keppra .  Not continued for long-term. Seizure precautions advised including not to drive for a period of 6 months at least   Acute metabolic encephalopathy Related to postictal state. Mental status gradually improved back to normal   b/l carotid artery stenosis 85%R and 60%L HLD Vascular surgery consult obtained.   Medical management recommended Started on aspirin 81 mg daily.  Continue Lipitor  Hyperglycemic hyperosmolar state  Type 2 diabetes mellitus uncontrolled A1c 12.1 on 11/05/2024 Blood sugar level was elevated over 500 at presentation.  Serum osmolality was elevated at 335, probably precipitated seizure Diabetes care coordinator consulted.  Patient follows up with Dr. Ilah for diabetes management.  Was on Ozempic in the past.  Not on insulin .  Has been recently on Jardiance 25 mg daily only. Based on blood sugar control in the hospital, discharged on Jardiance 25 mg daily, Lantus  5 units daily and SSI/Accu-Cheks. Recent Labs  Lab 11/08/24 1609 11/08/24 2010 11/08/24 2349 11/09/24 0431 11/09/24 0745  GLUCAP 161* 149* 99 106* 127*   Pelvic fractures Chronic thoracic and lumbar spine compression fractures  12/27, CT chest abdomen pelvis showed acute comminuted intra-articular fracture of the anterior column of the right acetabulum extending into the proximal right superior pubic ramus, and a minimally displaced fracture of the mid portion of the right inferior pubic ramus, with associated small right hip hemarthrosis and mild stranding along the right anterior pelvic sidewall, without pelvic free hemorrhage.  Also showed chronic thoracic and lumbar spine compression fractures. Presumably from the fall prior to presentation as well as recurrent fall in the past Seen by orthopedics.  Recommended nonsurgical management with PRAFO offloading wedge soft boots ,  TDWB RLE with walker and 1-2 person assist at all times  follow up as  outpatient for weightbearing pelvis films  PT/OT consulted Pain regimen --- Scheduled: Tylenol  1 g 3 times daily --- PRN: Oxycodone  as needed  Presumed candidal balanitis and phimosis, scrotal skin infection With phimosis, appreciate urology assistance, as long as patient is voiding, can avoid foley PCCM give 1 dose of fluconazole  150 mg and started on an empiric course of Rocephin  and doxycycline  for possible gonorrhea and chlamydia.  Completed the course Continue nystatin  cream to scrotum  AKI Creatinine gradually improved with fluids. Recent Labs    11/04/24 1825 11/04/24 1827 11/05/24 0315 11/05/24 0505 11/05/24 0953 11/05/24 1444 11/06/24 1129 11/08/24 0133 11/09/24 0910  BUN 29* 33* 24* 23 23 21 19  26* 21  CREATININE 2.00* 1.90* 1.69* 1.71* 1.73* 1.65* 1.67* 1.83* 1.54*  CO2 25  --  24 22 22  21* 20* 25 20*   Multifocal pneumonia 12/27, CT chest abdomen pelvis showed asymmetric posterior basal right lower lobe opacity likely due to bronchopneumonia, with additional suspected scattered ill-defined ground glass opacities in both lower lobes consistent with pneumonia. Completed course of Rocephin  and doxycycline .    Hypertension blood pressure has been consistently elevated to 170s and 180s, PTA meds-I do not see any antihypertensives in his home med list Started on amlodipine 5 mg daily today  Right renal mass 12/27, CT chest abdomen pelvis showed slightly heterogeneous rounded right parapelvic solid renal mass measuring 3.5 x 3.0 cm and 43 Hounsfield units, worrisome for neoplasm, with further evaluation recommended with renal protocol MRI or CT without and with contrast.  It also showed asymmetric sclerosis involving the right C7 vertebral body extending into the pedicle and articular pillar, indeterminate with metastatic disease not excluded, although Paget disease could have a similar appearance, with further evaluation recommended. Background Severe right renal atrophy  with apparent renal artery embolization. On my discussion with patient on 12/29, he mentioned that he has a spot on his right kidney which was cancerous and underwent 'a procedure' which he is not able to elaborate.  I assume he has had renal artery embolization.  He follows up with urology as an outpatient  Bladder trabeculation and diverticula which could be due to chronic bladder outlet obstruction or chronic infection.  Anxiety/depression Resume Effexor  Impaired mobility PT eval obtained.  SNF recommended   Goals of care   Code Status: Full Code   Diet:  Diet Order             Diet general           DIET DYS 2 Room service appropriate? No; Fluid consistency: Thin  Diet effective now                   Nutritional status:  Body mass index is 25.41 kg/m.       Wounds:  - Wound 11/05/24 0640 Irritant Contact Dermatitis Penis (Active)  Date First Assessed/Time First Assessed: 11/05/24 0640   Present on Original Admission: Yes  Primary Wound Type: Irritant Contact Dermatitis  Secondary Wound Type - Irritant Contact Dermatitis: Incontinence  Location: Penis    Assessments 11/05/2024  6:23 AM 11/09/2024  7:54 AM  Wound Image     Site / Wound Assessment Red --  Peri-wound Assessment Intact;Erythema (blanchable) Intact;Erythema (blanchable)  Drainage Description Serous --  Drainage Amount Scant None  Treatments Cleansed Cleansed  Dressing Type None None     No associated orders.  Wound 11/05/24 2000 Other (Comment) Heel Right (Active)  Date First Assessed/Time First Assessed: 11/05/24 2000   Present on Original Admission: Yes  Primary Wound Type: (c) Other (Comment)  Location: Heel  Location Orientation: Right    Assessments 11/05/2024  8:00 PM 11/09/2024  7:54 AM  Site / Wound Assessment Clean;Dry;Purple;Red Clean;Dry;Purple  Peri-wound Assessment Intact Intact  Drainage Amount -- None  Dressing Type None None     No associated orders.    Discharge  Medications:   Allergies as of 11/09/2024   No Known Allergies      Medication List     TAKE these medications    acetaminophen  500 MG tablet Commonly known as: TYLENOL  Take 2 tablets (1,000 mg total) by mouth 3 (three) times daily.   amLODipine 5 MG tablet Commonly known as: NORVASC Take 1 tablet (5 mg total) by mouth daily. Start taking on: November 10, 2024   aspirin 81 MG chewable tablet Chew 1 tablet (81 mg total) by mouth daily.   atorvastatin 40 MG tablet Commonly known as: LIPITOR Take 40 mg by mouth daily.   docusate sodium  100 MG capsule Commonly known as: COLACE Take 1 capsule (100 mg total) by mouth 2 (two) times daily as needed for mild constipation.   ferrous sulfate 325 (65 FE) MG tablet Take 325 mg by mouth every other day.   insulin  aspart 100 UNIT/ML injection Commonly known as: novoLOG  Inject 0-15 Units into the skin every 4 (four) hours.   insulin  glargine 100 UNIT/ML injection Commonly known as: LANTUS  Inject 0.05 mLs (5 Units total) into the skin at bedtime.   Jardiance 25 MG Tabs tablet Generic drug: empagliflozin Take 25 mg by mouth daily.   nystatin  powder Commonly known as: MYCOSTATIN /NYSTOP  Apply topically 3 (three) times daily.   oxyCODONE  5 MG immediate release tablet Commonly known as: Oxy IR/ROXICODONE  Take 1 tablet (5 mg total) by mouth every 6 (six) hours as needed for up to 3 days for moderate pain (pain score 4-6).   polyethylene glycol 17 g packet Commonly known as: MIRALAX  / GLYCOLAX  Take 17 g by mouth daily as needed for moderate constipation.   venlafaxine XR 150 MG 24 hr capsule Commonly known as: EFFEXOR-XR Take 150 mg by mouth daily.               Discharge Care Instructions  (From admission, onward)           Start     Ordered   11/09/24 0000  Discharge wound care:        11/09/24 1046             Follow ups:    Follow-up Information     [facility]  Follow up.   Contact information: [address] [employer] [address] [place]  [id number] [phone]                Discharge Instructions:   Discharge Instructions     Call MD for:  difficulty breathing, headache or visual disturbances   Complete by: As directed    Call MD for:  extreme fatigue   Complete by: As directed    Call MD for:  hives   Complete by: As directed    Call MD for:  persistant dizziness or light-headedness   Complete by: As directed    Call MD for:  persistant nausea and vomiting   Complete by: As directed    Call MD for:  severe uncontrolled pain   Complete by: As directed    Call MD for:  temperature >100.4   Complete by: As directed    Diet general   Complete by: As directed    Discharge instructions   Complete by: As directed    Discharge instructions for diabetes mellitus: Check blood sugar 3 times a day and bedtime at home. If blood sugar running above 200 or less than 70 please call your MD to adjust insulin . If you notice signs and symptoms of hypoglycemia (low blood sugar) like jitteriness, confusion, thirst, tremor and sweating, please check blood sugar, drink sugary drink/biscuits/sweets to increase sugar level and call MD or return to ER.      PDMP reviewed this encounter.   Opioid taper instructions: It is important to wean off of your opioid medication as soon as possible. If you do not need pain medication after your surgery it is ok to stop day one. Opioids include: Codeine, Hydrocodone(Norco, Vicodin), Oxycodone (Percocet, oxycontin ) and hydromorphone amongst others.  Long term and even short term use of opiods can cause: Increased pain response Dependence Constipation Depression Respiratory depression And more.  Withdrawal symptoms can include Flu like symptoms Nausea, vomiting And more Techniques to manage these symptoms Hydrate well Eat regular healthy meals Stay active Use relaxation  techniques(deep breathing, meditating, yoga) Do Not substitute Alcohol to help with tapering If you have been on opioids for less than two weeks and do not have pain than it is ok to stop all together.  Plan to wean off of opioids This plan should start within one week post op of your joint replacement. Maintain the same interval or time between taking each dose and first decrease the dose.  Cut the total daily intake of opioids by one tablet each day Next start to increase the time between doses. The last dose that should be eliminated is the evening dose.        General discharge instructions: Follow with Primary MD Pcp, No in 7 days  Please request your PCP  to go over your hospital tests, procedures, radiology results at the follow up. Please get your medicines reviewed and adjusted.  Your PCP may decide to repeat certain labs or tests as needed. Do not drive, operate heavy machinery, perform activities at heights, swimming or participation in water activities or provide baby sitting services if your were admitted for syncope or siezures until you have seen by Primary MD or a Neurologist and advised to do so again. [place]  Controlled Substance Reporting System database was reviewed. Do not drive, operate heavy machinery, perform activities at heights, swim, participate in water activities or provide baby-sitting services while on medications for pain, sleep and mood until your outpatient physician has reevaluated you and advised to do so again.  You are strongly recommended to comply with the dose, frequency and duration of prescribed medications. Activity: As tolerated with Full fall precautions use walker/cane & assistance as needed Avoid using any recreational substances like cigarette, tobacco, alcohol, or non-prescribed drug. If you experience worsening of your admission symptoms, develop shortness of breath, life threatening emergency, suicidal or homicidal thoughts you must  seek medical attention immediately by calling 911 or calling your MD immediately  if symptoms less severe. You must read complete instructions/literature along with all the possible adverse reactions/side effects for all the medicines you take and that have been prescribed to you. Take any new medicine only after you have completely understood and accepted all the possible  adverse reactions/side effects.  Wear Seat belts while driving. You were cared for by a hospitalist during your hospital stay. If you have any questions about your discharge medications or the care you received while you were in the hospital after you are discharged, you can call the unit and ask to speak with the hospitalist or the covering physician. Once you are discharged, your primary care physician will handle any further medical issues. Please note that NO REFILLS for any discharge medications will be authorized once you are discharged, as it is imperative that you return to your primary care physician (or establish a relationship with a primary care physician if you do not have one).   Discharge wound care:   Complete by: As directed    Increase activity slowly   Complete by: As directed        Discharge Exam:   Vitals:   11/09/24 0433 11/09/24 0500 11/09/24 0748 11/09/24 0902  BP: (!) 181/79  (!) 192/83 (!) 176/86  Pulse: 76  83   Resp: 17  18   Temp: (!) 97.5 F (36.4 C)  98.4 F (36.9 C)   TempSrc: Oral  Oral   SpO2: 98%  96%   Weight:  75.8 kg    Height:    5' 8 (1.727 m)    Body mass index is 25.41 kg/m.  General exam: Pleasant, elderly Caucasian male.  Not in physical distress Skin: No rashes, lesions or ulcers. HEENT: Atraumatic, normocephalic, no obvious bleeding Lungs: Clear to auscultation bilaterally,  CVS: S1, S2, no murmur,   GI/Abd: Soft, nontender, nondistended, bowel sound present,   CNS: Alert, awake, slow to respond but oriented x 3 Psychiatry: Mood appropriate Extremities: No pedal  edema, no calf tenderness,    The results of significant diagnostics from this hospitalization (including imaging, microbiology, ancillary and laboratory) are listed below for reference.    Procedures and Diagnostic Studies:   Overnight EEG with video Result Date: 11/05/2024 Shelton Arlin KIDD, MD     11/05/2024  8:29 PM Patient Name: [name] [name] MRN: [id number] Epilepsy Attending: Arlin KIDD Shelton Referring Physician/Provider: Remi Pippin, NP Duration: 11/04/2024 2045 to 11/05/2024 9075 Patient history: unknown age and name male with no known PMHx except for diabetes (unidentified at time of presentation). He was found down at [facility] in the bathroom. Staff think he was in there for 15 minutes. Found with right gaze, aphasia, right facial froop, right sided weakness, and right neglect. EEG to evaluate for seizure Level of alertness: Awake, asleep AEDs during EEG study: LEV Technical aspects: This EEG study was done with scalp electrodes positioned according to the 10-20 International system of electrode placement. Electrical activity was reviewed with band pass filter of 1-70Hz , sensitivity of 7 uV/mm, display speed of 75mm/sec with a 60Hz  notched filter applied as appropriate. EEG data were recorded continuously and digitally stored.  Video monitoring was available and reviewed as appropriate. Description: The posterior dominant rhythm consists of 8-9Hz  activity of moderate voltage (25-35 uV) seen predominantly in posterior head regions, symmetric and reactive to eye opening and eye closing. Sleep was characterized by vertex waves, sleep spindles (12 to 14 Hz), maximal frontocentral region.  There is continuous 3 to 6 Hz theta-delta slowing in left fronto-temporal region. Hyperventilation and photic stimulation were not performed.   ABNORMALITY - Continuous slow, left fronto-temporal region IMPRESSION: This study is suggestive of cortical dysfunction arising from  left fronto-temporal region likely  secondary to underlying structural abnormality, post-ictal state. No seizures  or epileptiform discharges were seen throughout the recording. Priyanka MALVA Krebs   CT CHEST ABDOMEN PELVIS WO CONTRAST Result Date: 11/05/2024 EXAM: CT CHEST, ABDOMEN AND PELVIS WITHOUT CONTRAST 11/05/2024 06:04:30 AM TECHNIQUE: CT of the chest, abdomen and pelvis was performed without the administration of intravenous contrast. Multiplanar reformatted images are provided for review. Automated exposure control, iterative reconstruction, and/or weight based adjustment of the mA/kV was utilized to reduce the radiation dose to as low as reasonably achievable. COMPARISON: CTA head and neck dated 11/04/2024. CLINICAL HISTORY: Polytrauma, blunt; found down, AMS, AP. Exam is for trauma and sepsis screening. He had vascular stenoses on CTA head and neck yesterday, but the exam was negative for acute ischemia in the brain. FINDINGS: LIMITATIONS/ARTIFACTS: There is abundant respiratory motion on exam as well as body motion, limiting the study. CHEST: MEDIASTINUM AND LYMPH NODES: Heart and pericardium are unremarkable. The cardiac size is normal. There are patchy 3-vessel coronary calcifications. There is atherosclerosis in the aorta and great vessels but no aortic aneurysm. The pulmonary arteries and veins are normal in caliber. The left lobe of the thyroid gland has either been removed or is atrophic. The right lobe is unremarkable. There is a mildly patulous esophagus with retained versus refluxed fluid in the upper thoracic esophagus. Consider aspiration precautions unless already being done. No mediastinal, hilar or axillary lymphadenopathy. LUNGS AND PLEURA: Diffuse bronchial thickening is seen. Central airways appear to be grossly clear, but there are scattered posterior basal subsegmental bronchial impactions visible in the right lower lobe if not also a few in the left lower lobe. There is asymmetric posterior basal right lower lobe  opacity, which is probably due to bronchopneumonia given the bronchial impactions in the area. Although fine detail is very limited, there are also suspected to be scattered ill-defined ground glass opacities in both lower lobes above this level consistent with additional pneumonia. No other focal consolidations are seen. There is asymmetric pleuroparenchymal scarring in the right apex. Trace pleural effusions. No pneumothorax. ABDOMEN AND PELVIS: LIVER: There is loss of fine detail in the liver parenchyma with no obvious mass. GALLBLADDER AND BILE DUCTS: There is vicariously excreted contrast in the gallbladder. No filling defects, wall thickening, or biliary dilatation. SPLEEN: No acute abnormality. PANCREAS: No acute abnormality. ADRENAL GLANDS: There is slight nodular thickening of both adrenal glands. KIDNEYS, URETERS AND BLADDER: Right kidney is severely atrophic. The right renal artery and multiple of its hilar branches appear to have been embolized. There is branching opaque material extending through the right renal artery to the hilar branches with severe chronic right renal atrophy. There is a slightly heterogeneous rounded right parapelvic solid mass measuring 3.5 x 3 cm and 43 Hounsfield units on series 3 axial 62 worrisome for neoplasm. Consider follow-up with MRI versus CT without contrast. MRI may be compromised by susceptibility artifact. There is scarring and volume loss in the inferior pole of the left kidney but no contour deforming mass is suspected . There is contrast in the left renal collecting system and in the normal caliber ureter. The contrast would obscure stones if present. No obstructive uropathy is seen. . There is thickening and trabeculation of the bladder wall. Bilateral diverticula arise from the posterolateral bladder wall from a narrow opening on both sides. Etiology could be chronic infection or chronic bladder outlet obstruction. GI AND BOWEL: Stomach demonstrates no acute  abnormality. No bowel obstruction or inflammation is seen through the motion artifacts. REPRODUCTIVE ORGANS: There has been a prior radical prostatectomy.  No masses seen in the prostate bed. PERITONEUM AND RETROPERITONEUM: No ascites. No free air. VASCULATURE: Aorta is normal in caliber with heavy aortoiliac calcific plaques . . . ABDOMINAL AND PELVIS LYMPH NODES: No lymphadenopathy. BONES AND SOFT TISSUES: There is bilateral subareolar gynecomastia. No acute chest wall findings. No obvious thoracic skeletal acute fracture. There is asymmetric sclerosis in the right side of the C7 vertebral body extending into the pedicle and articular pillar concerning for metastatic disease, although no other levels show focal lesions and mono-ostotic Paget's disease could also have this appearance. There is mild chronic upper plate anterior wedging of the T7 vertebral body. The T12 ribs are hypoplastic. SABRA There is a chronic moderate anterior wedge compression fracture of the T12 body with bridging osteophytes to T11 and slight retropulsion. There is a recent, probably acute comminuted intraarticular fracture of the anterior column of the right acetabulum extending into the proximal right superior pubic ramus, and a minimally displaced fracture of the mid portion of the right inferior pubic ramus. There is at least a small right hip hemarthrosis associated. There is mild stranding along the right anterior pelvic sidewall adjacent to the acetabular fracture but no pelvic free hemorrhage. There is no other definitive acute regional skeletal fracture. Prominent There are only 4 lumbar-type segments in this patient. There are mild to moderate compression fractures at L2 and L4 and a mild upper plate anterior wedge compression fracture of L3, which all have a grossly chronic appearance. There are degenerative changes and osteopenia. Spurring of the symphysis pubis. No suspicious regional bone lesion. There are small inguinal fat  hernias. IMPRESSION: 1. Acute comminuted intraarticular fracture of the anterior column of the right acetabulum extending into the proximal right superior pubic ramus, and a minimally displaced fracture of the mid portion of the right inferior pubic ramus, with associated small right hip hemarthrosis and mild stranding along the right anterior pelvic sidewall, without pelvic free hemorrhage. 2. Asymmetric posterior basal right lower lobe opacity likely due to bronchopneumonia, with additional suspected scattered ill-defined ground glass opacities in both lower lobes consistent with pneumonia. 3. Slightly heterogeneous rounded right parapelvic solid renal mass measuring 3.5 x 3.0 cm and 43 Hounsfield units, worrisome for neoplasm, with further evaluation recommended with renal protocol MRI or CT without and with contrast. Background Severe right renal atrophy with apparent renal artery embolization. 4. Asymmetric sclerosis involving the right C7 vertebral body extending into the pedicle and articular pillar, indeterminate with metastatic disease not excluded, although Paget disease could have a similar appearance, with further evaluation recommended. . 5. Aortic and coronary atherosclerosis. SABRA 6. Thoracic and lumbar spine compression fractures with a chronic appearance . 7. Bladder trabeculation and diverticula , which could be due to chronic bladder outlet obstruction or chronic infection. Electronically signed by: Francis Quam MD 11/05/2024 07:07 AM EST RP Workstation: [id number]   DG Chest 1 View Result Date: 11/04/2024 EXAM: 1 VIEW(S) XRAY OF THE CHEST 11/04/2024 07:06:00 PM COMPARISON: None available. CLINICAL HISTORY: cough/found down FINDINGS: LUNGS AND PLEURA: Low lung volumes. Patchy airspace opacities at left lung base. Question right apical nodule-like density versus overlapping osseous structures. No pleural effusion. No pneumothorax. HEART AND MEDIASTINUM: Aortic atherosclerosis. No acute  abnormality of the cardiac and mediastinal silhouettes. BONES AND SOFT TISSUES: No acute osseous abnormality. IMPRESSION: 1. Low lung volumes with patchy left basilar airspace opacities, which may reflect atelectasis or pneumonia. Recommend repeat PA and lateral view of the chest with improved inspiratory effort to further evaluate. 2.  Question right apical nodule-like density versus overlapping osseous structures. Finding can also be further evaluated on repeat chest x-ray. Electronically signed by: Morgane Naveau MD 11/04/2024 08:57 PM EST RP Workstation: [id number]   CT ANGIO HEAD NECK W WO CM W PERF (CODE STROKE) Result Date: 11/04/2024 EXAM: CT ANGIOGRAPHY OF THE HEAD AND NECK CT PERFUSION BRAIN 11/04/2024 06:45:49 PM TECHNIQUE: Contiguous axial images were obtained from the base of the skull through the vertex without intravenous contrast. Multidetector CT imaging of the head and neck was performed using the standard protocol during bolus administration of 100 mL of iohexol  (OMNIPAQUE ) 350 MG/ML injection. 3D postprocessing with multiplanar reconstructions and MIPs was performed to evaluate the vascular anatomy. Carotid stenosis measurements (when applicable) are obtained utilizing NASCET criteria, using the distal internal carotid diameter as the denominator. Cerebral perfusion analysis using computed tomography with contrast administration, including post-processing of parametric maps with determination of cerebral blood flow, cerebral blood volume, mean transit time and time-to-maximum. RADIATION DOSE REDUCTION: This exam was performed according to the departmental dose-optimization program which includes automated exposure control, adjustment of the mA and/or kV according to patient size and/or use of iterative reconstruction technique. CONTRAST: Without and with IV contrast. 100 mL of iohexol  (OMNIPAQUE ) 350 MG/ML injection. COMPARISON: CT head dated 11/04/2024. CLINICAL HISTORY: Neuro deficit, acute,  stroke suspected. FINDINGS: CT HEAD: Question prominence of the bilateral optic nerve sheath complexes; recommend correlation with fundoscopic exam findings. CTA NECK: AORTIC ARCH AND ARCH VESSELS: Moderate atherosclerosis of the visualized aortic arch. 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 Atherosclerosis at the origin of the aberrant right subclavian resulting in focal mild stenosis. Moderate stenosis at the origin of the left subclavian artery. No dissection or arterial injury. CERVICAL CAROTID ARTERIES: Mixed atherosclerotic plaque at the right carotid bifurcation resulting in 85% stenosis of the proximal right cervical ICA. Additional moderate stenosis at the origin of the right external carotid artery. Mixed atherosclerotic plaque at the left carotid bifurcation which results in approximately 60% stenosis at the origin of the left cervical ICA. No dissection or arterial injury. CERVICAL VERTEBRAL ARTERIES: Right vertebral artery origin is obscured due to streak artifact from dense venous contrast. The right vertebral artery is dominant. Atherosclerosis of the origin of the nondominant left vertebral artery resulting in focal severe stenosis. No dissection or arterial injury. LUNGS AND MEDIASTINUM: Opacities within the medial aspect of the right upper lobe. There is bronchial wall thickening within the right upper lobe which may be related to infection. SOFT TISSUES: Hypoplastic appearance of the thyroid with nonvisualization of the left thyroid lobe. BONES: Degenerative changes in the visualized spine. There is 5 mm anterolisthesis of C3 on C4 likely related to chronic facet degenerative changes. Dental caries. Sclerosis in the right aspect of the C7 vertebral body and the right pedicle of C7 which could reflect an osseous lesion; recommend correlation with MRI of the cervical spine. CTA HEAD: ANTERIOR CIRCULATION: Atherosclerosis of the bilateral carotid siphons. There is moderate  stenosis of the bilateral cavernous and paraophthalmic ICAs. There is a 2 mm inferiorly directed outpouching along the left supraclinoid ICA which may reflect a small aneurysm versus infundibulum at the origin of the posterior communicating artery. A1 segment of the right ACA likely congenital. No significant stenosis of the anterior cerebral arteries. No significant stenosis of the middle cerebral arteries. POSTERIOR CIRCULATION: There is a 2 mm inferiorly directed outpouching at the P1 and P2 junction of the left PCA concerning for small aneurysm (series 5,  image 134). No significant stenosis of the posterior cerebral arteries. No significant stenosis of the basilar artery. No significant stenosis of the vertebral arteries. OTHER: No dural venous sinus thrombosis on this non-dedicated study. CT PERFUSION: EXAM QUALITY: Exam quality is adequate with diagnostic perfusion maps. No significant motion artifact. Appropriate arterial inflow and venous outflow curves. CORE INFARCT (CBF<30% volume): 0 mL TOTAL HYPOPERFUSION (Tmax>6s volume): 3 mL PENUMBRA: Mismatch volume: 3 mL Mismatch ratio: not applicable Location: not applicable IMPRESSION: 1. No large vessel occlusion. 2. No evidence of ischemia by CT brain perfusion. 3. Mixed atherosclerotic plaque at the right carotid bifurcation with approximately 85% stenosis at the origin of the right cervical ICA. 4. Mixed atherosclerotic plaque at the left carotid bifurcation with approximately 60% stenosis at the origin of the left cervical ICA. 5. Severe stenosis at the origin of the nondominant left vertebral artery. 6. Atherosclerosis of the bilateral carotid siphons with moderate stenosis of the bilateral cavernous and paraophthalmic ICAs. 7. 2 mm inferiorly directed outpouching along the left supraclinoid ICA, possibly a small aneurysm versus infundibulum at the origin of the posterior communicating artery. 8. Additional 2 mm inferiorly directed outpouching at the P1 and  P2 junction of the left PCA, concerning for a small aneurysm. 9. Question prominence of the bilateral optic nerve sheath complexes. Recommend correlation with fundoscopic exam findings. 10. Sclerosis in the right aspect of the C7 vertebral body and right pedicle, which could reflect an osseous lesion. Recommend MRI of the cervical spine with and without contrast. 11. Opacities and bronchial wall thickening in the medial right upper lobe, which may be related to infection. Electronically signed by: Donnice Mania MD 11/04/2024 07:21 PM EST RP Workstation: [id number]   CT HEAD CODE STROKE WO CONTRAST Result Date: 11/04/2024 EXAM: CT HEAD WITHOUT CONTRAST 11/04/2024 06:32:10 PM TECHNIQUE: CT of the head was performed without the administration of intravenous contrast. Automated exposure control, iterative reconstruction, and/or weight based adjustment of the mA/kV was utilized to reduce the radiation dose to as low as reasonably achievable. COMPARISON: None available. CLINICAL HISTORY: Neuro deficit, acute, stroke suspected. FINDINGS: BRAIN AND VENTRICLES: No acute hemorrhage. No evidence of acute infarct. Mild to moderate chronic microvascular ischemic changes and generalized parenchymal volume loss. Skull base atherosclerosis involving the carotid siphons and intracranial vertebral arteries. No hydrocephalus. No extra-axial collection. No mass effect or midline shift. [place] Stroke Program Early CT (ASPECT) Score: Ganglionic (caudate, internal capsule, lentiform nucleus, insula, M1-M3): 7 Supraganglionic (M4-M6): 3 Total: 10 ORBITS: Bilateral lens replacement. SINUSES: No acute abnormality. SOFT TISSUES AND SKULL: No acute soft tissue abnormality. No skull fracture. Chronic bilateral nasal bone deformities. IMPRESSION: 1. No acute intracranial abnormality. 2. ASPECTS 10. 3. Findings messaged to Dr. Lindzen at 6:39 PM on 11/04/24. Electronically signed by: Donnice Mania MD 11/04/2024 06:41 PM EST RP Workstation:  [id number]     Labs:   Basic Metabolic Panel: Recent Labs  Lab 11/05/24 0505 11/05/24 0953 11/05/24 1444 11/06/24 1129 11/08/24 0133 11/09/24 0910  NA 138 140 141 137 136 134*  K 3.7 3.3* 3.8 4.8 4.4 4.4  CL 104 107 107 105 102 102  CO2 22 22 21* 20* 25 20*  GLUCOSE 180* 193* 123* 143* 111* 108*  BUN 23 23 21 19  26* 21  CREATININE 1.71* 1.73* 1.65* 1.67* 1.83* 1.54*  CALCIUM 9.0 8.7* 9.2 8.8* 8.8* 9.2  MG 1.8  --   --   --   --   --   PHOS 2.0*  --   --   --   --   --  GFR Estimated Creatinine Clearance: 32.7 mL/min (A) (by C-G formula based on SCr of 1.54 mg/dL (H)). Liver Function Tests: Recent Labs  Lab 11/04/24 1825  AST 51*  ALT 39  ALKPHOS 170*  BILITOT 0.5  PROT 7.0  ALBUMIN 4.3   No results for input(s): LIPASE, AMYLASE in the last 168 hours. Recent Labs  Lab 11/05/24 0511  AMMONIA 25   Coagulation profile Recent Labs  Lab 11/04/24 1825  INR 1.0    CBC: Recent Labs  Lab 11/04/24 1825 11/04/24 1827 11/05/24 0106 11/05/24 0315 11/05/24 0505 11/08/24 0133  WBC 12.1*  --   --  17.3* 18.4* 8.0  NEUTROABS 7.0  --   --   --   --  5.8  HGB 15.2 16.0 12.9* 13.3 13.1 11.8*  HCT 44.9 47.0 38.0* 39.9 38.4* 35.4*  MCV 91.4  --   --  93.0 92.3 92.4  PLT 271  --   --  200 208 189   Cardiac Enzymes: No results for input(s): CKTOTAL, CKMB, CKMBINDEX, TROPONINI in the last 168 hours. BNP: Invalid input(s): POCBNP CBG: Recent Labs  Lab 11/08/24 1609 11/08/24 2010 11/08/24 2349 11/09/24 0431 11/09/24 0745  GLUCAP 161* 149* 99 106* 127*   D-Dimer No results for input(s): DDIMER in the last 72 hours. Hgb A1c No results for input(s): HGBA1C in the last 72 hours. Lipid Profile No results for input(s): CHOL, HDL, LDLCALC, TRIG, CHOLHDL, LDLDIRECT in the last 72 hours. Thyroid function studies No results for input(s): TSH, T4TOTAL, T3FREE, THYROIDAB in the last 72 hours.  Invalid input(s):  FREET3 Anemia work up No results for input(s): VITAMINB12, FOLATE, FERRITIN, TIBC, IRON, RETICCTPCT in the last 72 hours. Microbiology Recent Results (from the past 240 hours)  Resp panel by RT-PCR (RSV, Flu A&B, Covid) Anterior Nasal Swab     Status: None   Collection Time: 11/04/24  7:02 PM   Specimen: Anterior Nasal Swab  Result Value Ref Range Status   SARS Coronavirus 2 by RT PCR NEGATIVE NEGATIVE Final   Influenza A by PCR NEGATIVE NEGATIVE Final   Influenza B by PCR NEGATIVE NEGATIVE Final    Comment: (NOTE) The Xpert Xpress SARS-CoV-2/FLU/RSV plus assay is intended as an aid in the diagnosis of influenza from Nasopharyngeal swab specimens and should not be used as a sole basis for treatment. Nasal washings and aspirates are unacceptable for Xpert Xpress SARS-CoV-2/FLU/RSV testing.  Fact Sheet for Patients: bloggercourse.com  Fact Sheet for Healthcare Providers: seriousbroker.it  This test is not yet approved or cleared by the [place]  FDA and has been authorized for detection and/or diagnosis of SARS-CoV-2 by FDA under an Emergency Use Authorization (EUA). This EUA will remain in effect (meaning this test can be used) for the duration of the COVID-19 declaration under Section 564(b)(1) of the Act, 21 U.S.C. section 360bbb-3(b)(1), unless the authorization is terminated or revoked.     Resp Syncytial Virus by PCR NEGATIVE NEGATIVE Final    Comment: (NOTE) Fact Sheet for Patients: bloggercourse.com  Fact Sheet for Healthcare Providers: seriousbroker.it  This test is not yet approved or cleared by the [place]  FDA and has been authorized for detection and/or diagnosis of SARS-CoV-2 by FDA under an Emergency Use Authorization (EUA). This EUA will remain in effect (meaning this test can be used) for the duration of the COVID-19 declaration under  Section 564(b)(1) of the Act, 21 U.S.C. section 360bbb-3(b)(1), unless the authorization is terminated or revoked.  Performed at [facility], [address] [place], [place] [address]   Culture, blood (routine x 2)     Status: None   Collection Time: 11/04/24  8:24 PM   Specimen: BLOOD  Result Value Ref Range Status   Specimen Description BLOOD LEFT ANTECUBITAL  Final   Special Requests   Final    BOTTLES DRAWN AEROBIC AND ANAEROBIC Blood Culture adequate volume   Culture   Final    NO GROWTH 5 DAYS Performed at [facility], [address], [place], [place] [address]    Report Status 11/09/2024 FINAL  Final  MRSA Next Gen by PCR, Nasal     Status: None   Collection Time: 11/05/24 12:21 AM   Specimen: Nasal Mucosa; Nasal Swab  Result Value Ref Range Status   MRSA by PCR Next Gen NOT DETECTED NOT DETECTED Final    Comment: (NOTE) The GeneXpert MRSA Assay (FDA approved for NASAL specimens only), is one component of a comprehensive MRSA colonization surveillance program. It is not intended to diagnose MRSA infection nor to guide or monitor treatment for MRSA infections. Test performance is not FDA approved in patients less than 90 years old. Performed at [facility], [address], [place], [place] [address]   Culture, blood (routine x 2)     Status: None (Preliminary result)   Collection Time: 11/05/24  6:59 AM   Specimen: BLOOD LEFT HAND  Result Value Ref Range Status   Specimen Description BLOOD LEFT HAND  Final   Special Requests   Final    BOTTLES DRAWN AEROBIC ONLY Blood Culture results may not be optimal due to an inadequate volume of blood received in culture bottles   Culture   Final    NO GROWTH 4 DAYS Performed at [facility], [address], [place], [place] [address]    Report Status PENDING  Incomplete    Time coordinating discharge: 45 minutes  Signed: Izek Corvino  [facility] 11/09/2024, 10:46 AM   "

## 2024-11-09 NOTE — Progress Notes (Signed)
 Speech Language Pathology Treatment: Dysphagia  Patient Details Name: [name] [name] MRN: [id number] DOB: [birth year] Today's Date: 11/09/2024 Time: 9064-9054 SLP Time Calculation (min) (ACUTE ONLY): 10 min  Assessment / Plan / Recommendation Clinical Impression  Skilled therapy session focused on dysphagia goals. SLP facilitated session by observing patient with trials of Dys 3 solids and thin liquids. Patient with timely mastication and complete oral clearance. x1 delayed throat clear after solids, though no other s/sx of aspiration. Recommend upgrade to Dys3/thin liquids and intermittent supervision. Patient left in chair with call bell in reach. Continue POC  HPI HPI: [name] [name] is an 89 y.o. male who preasented to the hospital on 11/04/24 after being found down at a [employer] in the bathroom. He was found to have right gaze, aphasia, right facial droop, right sided weakness and right neglect. Initially 82% SpO2 on RA, improved to 99% on 6L. CTH negative for acute intracranial abnormality. MRI brain reports No acute intracranial abnormality. CXR reported low lung volumes, patchy left basilar airspace opacities, which may reflect atelectasis or pneumonia. Neurology consulted who indicated that his presentation is most consistent with an unwitnessed seizure resulting in a fall. He failed [facility] with RN and SLP swallow evaluation ordered. PMH: HTN, DM, depression, prostate cancer s/p resection.      SLP Plan  Continue with current plan of care       Recommendations  Diet recommendations: Dysphagia 3 (mechanical soft);Thin liquid Liquids provided via: Cup;Straw Medication Administration: Whole meds with puree Supervision: Full supervision/cueing for compensatory strategies;Staff to assist with self feeding Compensations: Slow rate;Small sips/bites Postural Changes and/or Swallow Maneuvers: Seated upright 90 degrees;Upright 30-60 min after meal        Oral care BID   Intermittent  Supervision/Assistance Dysphagia, unspecified (R13.10)     Continue with current plan of care      Bryonna Sundby M.A., CCC-SLP 11/09/2024, 9:54 AM
